# Patient Record
Sex: Female | Born: 1950 | Race: White | Hispanic: No | Smoking: Never smoker
Health system: Southern US, Community
[De-identification: ages and names within clinical notes are randomized; demographics above are authoritative.]

## PROBLEM LIST (undated history)

## (undated) DIAGNOSIS — K219 Gastro-esophageal reflux disease without esophagitis: Secondary | ICD-10-CM

## (undated) DIAGNOSIS — F329 Major depressive disorder, single episode, unspecified: Secondary | ICD-10-CM

## (undated) DIAGNOSIS — F0390 Unspecified dementia without behavioral disturbance: Secondary | ICD-10-CM

## (undated) DIAGNOSIS — I10 Essential (primary) hypertension: Secondary | ICD-10-CM

## (undated) DIAGNOSIS — F419 Anxiety disorder, unspecified: Secondary | ICD-10-CM

## (undated) DIAGNOSIS — I359 Nonrheumatic aortic valve disorder, unspecified: Secondary | ICD-10-CM

## (undated) DIAGNOSIS — E049 Nontoxic goiter, unspecified: Secondary | ICD-10-CM

## (undated) DIAGNOSIS — R7301 Impaired fasting glucose: Secondary | ICD-10-CM

## (undated) DIAGNOSIS — I350 Nonrheumatic aortic (valve) stenosis: Secondary | ICD-10-CM

## (undated) DIAGNOSIS — G2581 Restless legs syndrome: Secondary | ICD-10-CM

## (undated) DIAGNOSIS — M51369 Other intervertebral disc degeneration, lumbar region without mention of lumbar back pain or lower extremity pain: Secondary | ICD-10-CM

## (undated) DIAGNOSIS — M4126 Other idiopathic scoliosis, lumbar region: Secondary | ICD-10-CM

## (undated) DIAGNOSIS — E2839 Other primary ovarian failure: Secondary | ICD-10-CM

## (undated) DIAGNOSIS — F32A Depression, unspecified: Secondary | ICD-10-CM

## (undated) DIAGNOSIS — M5136 Other intervertebral disc degeneration, lumbar region: Secondary | ICD-10-CM

## (undated) DIAGNOSIS — G4700 Insomnia, unspecified: Secondary | ICD-10-CM

## (undated) DIAGNOSIS — G629 Polyneuropathy, unspecified: Secondary | ICD-10-CM

## (undated) DIAGNOSIS — IMO0001 Reserved for inherently not codable concepts without codable children: Secondary | ICD-10-CM

## (undated) DIAGNOSIS — G4730 Sleep apnea, unspecified: Secondary | ICD-10-CM

## (undated) DIAGNOSIS — R112 Nausea with vomiting, unspecified: Secondary | ICD-10-CM

## (undated) DIAGNOSIS — N183 Chronic kidney disease, stage 3 unspecified: Secondary | ICD-10-CM

## (undated) DIAGNOSIS — E785 Hyperlipidemia, unspecified: Secondary | ICD-10-CM

## (undated) DIAGNOSIS — M1990 Unspecified osteoarthritis, unspecified site: Secondary | ICD-10-CM

## (undated) DIAGNOSIS — IMO0002 Reserved for concepts with insufficient information to code with codable children: Secondary | ICD-10-CM

## (undated) DIAGNOSIS — E039 Hypothyroidism, unspecified: Secondary | ICD-10-CM

## (undated) DIAGNOSIS — R55 Syncope and collapse: Secondary | ICD-10-CM

## (undated) DIAGNOSIS — R7303 Prediabetes: Secondary | ICD-10-CM

## (undated) DIAGNOSIS — J45909 Unspecified asthma, uncomplicated: Secondary | ICD-10-CM

## (undated) DIAGNOSIS — Z9889 Other specified postprocedural states: Secondary | ICD-10-CM

## (undated) HISTORY — DX: Major depressive disorder, single episode, unspecified: F32.9

## (undated) HISTORY — PX: COLONOSCOPY: SHX174

## (undated) HISTORY — DX: Essential (primary) hypertension: I10

## (undated) HISTORY — DX: Other idiopathic scoliosis, lumbar region: M41.26

## (undated) HISTORY — DX: Impaired fasting glucose: R73.01

## (undated) HISTORY — PX: JOINT REPLACEMENT: SHX530

## (undated) HISTORY — DX: Hyperlipidemia, unspecified: E78.5

## (undated) HISTORY — DX: Chronic kidney disease, stage 3 unspecified: N18.30

## (undated) HISTORY — DX: Unspecified asthma, uncomplicated: J45.909

## (undated) HISTORY — DX: Nonrheumatic aortic valve disorder, unspecified: I35.9

## (undated) HISTORY — PX: ESOPHAGOGASTRODUODENOSCOPY: SHX1529

## (undated) HISTORY — DX: Other intervertebral disc degeneration, lumbar region: M51.36

## (undated) HISTORY — DX: Other primary ovarian failure: E28.39

## (undated) HISTORY — DX: Syncope and collapse: R55

## (undated) HISTORY — DX: Nontoxic goiter, unspecified: E04.9

## (undated) HISTORY — DX: Restless legs syndrome: G25.81

## (undated) HISTORY — PX: REDUCTION MAMMAPLASTY: SUR839

## (undated) HISTORY — DX: Unspecified osteoarthritis, unspecified site: M19.90

## (undated) HISTORY — DX: Polyneuropathy, unspecified: G62.9

## (undated) HISTORY — DX: Other intervertebral disc degeneration, lumbar region without mention of lumbar back pain or lower extremity pain: M51.369

## (undated) HISTORY — PX: EYE SURGERY: SHX253

## (undated) HISTORY — PX: ABDOMINAL HYSTERECTOMY: SHX81

## (undated) HISTORY — DX: Insomnia, unspecified: G47.00

## (undated) HISTORY — DX: Hypothyroidism, unspecified: E03.9

---

## 1999-03-28 ENCOUNTER — Ambulatory Visit (HOSPITAL_COMMUNITY): Admission: RE | Admit: 1999-03-28 | Discharge: 1999-03-28 | Payer: Self-pay | Admitting: Obstetrics and Gynecology

## 1999-03-28 ENCOUNTER — Encounter: Payer: Self-pay | Admitting: Obstetrics and Gynecology

## 2000-01-16 ENCOUNTER — Ambulatory Visit (HOSPITAL_COMMUNITY): Admission: RE | Admit: 2000-01-16 | Discharge: 2000-01-16 | Payer: Self-pay | Admitting: Plastic Surgery

## 2000-05-06 ENCOUNTER — Ambulatory Visit (HOSPITAL_COMMUNITY): Admission: RE | Admit: 2000-05-06 | Discharge: 2000-05-06 | Payer: Self-pay | Admitting: Obstetrics and Gynecology

## 2000-05-06 ENCOUNTER — Encounter: Payer: Self-pay | Admitting: Obstetrics and Gynecology

## 2001-05-08 ENCOUNTER — Ambulatory Visit (HOSPITAL_COMMUNITY): Admission: RE | Admit: 2001-05-08 | Discharge: 2001-05-08 | Payer: Self-pay | Admitting: Obstetrics and Gynecology

## 2001-05-08 ENCOUNTER — Encounter: Payer: Self-pay | Admitting: Obstetrics and Gynecology

## 2001-06-02 ENCOUNTER — Ambulatory Visit (HOSPITAL_COMMUNITY): Admission: RE | Admit: 2001-06-02 | Discharge: 2001-06-02 | Payer: Self-pay | Admitting: Gastroenterology

## 2002-05-10 ENCOUNTER — Ambulatory Visit (HOSPITAL_COMMUNITY): Admission: RE | Admit: 2002-05-10 | Discharge: 2002-05-10 | Payer: Self-pay | Admitting: Obstetrics and Gynecology

## 2002-05-10 ENCOUNTER — Encounter: Payer: Self-pay | Admitting: Obstetrics and Gynecology

## 2002-09-22 ENCOUNTER — Encounter: Payer: Self-pay | Admitting: General Surgery

## 2002-09-22 ENCOUNTER — Ambulatory Visit (HOSPITAL_COMMUNITY): Admission: RE | Admit: 2002-09-22 | Discharge: 2002-09-22 | Payer: Self-pay | Admitting: General Surgery

## 2003-05-13 ENCOUNTER — Encounter: Payer: Self-pay | Admitting: Obstetrics and Gynecology

## 2003-05-13 ENCOUNTER — Ambulatory Visit (HOSPITAL_COMMUNITY): Admission: RE | Admit: 2003-05-13 | Discharge: 2003-05-13 | Payer: Self-pay | Admitting: Obstetrics and Gynecology

## 2003-05-15 ENCOUNTER — Emergency Department (HOSPITAL_COMMUNITY): Admission: EM | Admit: 2003-05-15 | Discharge: 2003-05-15 | Payer: Self-pay | Admitting: Emergency Medicine

## 2003-05-15 ENCOUNTER — Encounter: Payer: Self-pay | Admitting: Emergency Medicine

## 2003-09-27 ENCOUNTER — Encounter: Admission: RE | Admit: 2003-09-27 | Discharge: 2003-09-27 | Payer: Self-pay | Admitting: Family Medicine

## 2004-05-16 ENCOUNTER — Ambulatory Visit (HOSPITAL_COMMUNITY): Admission: RE | Admit: 2004-05-16 | Discharge: 2004-05-16 | Payer: Self-pay | Admitting: Obstetrics and Gynecology

## 2004-07-31 ENCOUNTER — Encounter: Admission: RE | Admit: 2004-07-31 | Discharge: 2004-07-31 | Payer: Self-pay | Admitting: Occupational Medicine

## 2005-06-10 ENCOUNTER — Ambulatory Visit (HOSPITAL_COMMUNITY): Admission: RE | Admit: 2005-06-10 | Discharge: 2005-06-10 | Payer: Self-pay | Admitting: Obstetrics and Gynecology

## 2006-07-14 ENCOUNTER — Ambulatory Visit (HOSPITAL_COMMUNITY): Admission: RE | Admit: 2006-07-14 | Discharge: 2006-07-14 | Payer: Self-pay | Admitting: Obstetrics and Gynecology

## 2006-09-29 ENCOUNTER — Ambulatory Visit (HOSPITAL_COMMUNITY): Admission: RE | Admit: 2006-09-29 | Discharge: 2006-09-29 | Payer: Self-pay | Admitting: Gastroenterology

## 2007-07-29 ENCOUNTER — Ambulatory Visit (HOSPITAL_COMMUNITY): Admission: RE | Admit: 2007-07-29 | Discharge: 2007-07-29 | Payer: Self-pay | Admitting: Obstetrics and Gynecology

## 2008-03-22 ENCOUNTER — Ambulatory Visit (HOSPITAL_COMMUNITY): Admission: RE | Admit: 2008-03-22 | Discharge: 2008-03-22 | Payer: Self-pay | Admitting: Orthopedic Surgery

## 2008-08-10 ENCOUNTER — Ambulatory Visit (HOSPITAL_COMMUNITY): Admission: RE | Admit: 2008-08-10 | Discharge: 2008-08-10 | Payer: Self-pay | Admitting: Family Medicine

## 2008-12-01 ENCOUNTER — Ambulatory Visit (HOSPITAL_COMMUNITY): Admission: RE | Admit: 2008-12-01 | Discharge: 2008-12-01 | Payer: Self-pay | Admitting: Family Medicine

## 2009-08-11 ENCOUNTER — Ambulatory Visit (HOSPITAL_COMMUNITY): Admission: RE | Admit: 2009-08-11 | Discharge: 2009-08-11 | Payer: Self-pay | Admitting: Family Medicine

## 2009-11-18 HISTORY — PX: BREAST REDUCTION SURGERY: SHX8

## 2010-08-13 ENCOUNTER — Ambulatory Visit (HOSPITAL_COMMUNITY): Admission: RE | Admit: 2010-08-13 | Discharge: 2010-08-13 | Payer: Self-pay | Admitting: Family Medicine

## 2010-11-08 ENCOUNTER — Ambulatory Visit
Admission: RE | Admit: 2010-11-08 | Discharge: 2010-11-08 | Payer: Self-pay | Source: Home / Self Care | Attending: Plastic Surgery | Admitting: Plastic Surgery

## 2010-11-16 ENCOUNTER — Ambulatory Visit (HOSPITAL_COMMUNITY): Admission: RE | Admit: 2010-11-16 | Payer: Self-pay | Source: Home / Self Care | Admitting: Plastic Surgery

## 2010-11-16 ENCOUNTER — Ambulatory Visit
Admission: RE | Admit: 2010-11-16 | Discharge: 2010-11-17 | Payer: Self-pay | Source: Home / Self Care | Attending: Plastic Surgery | Admitting: Plastic Surgery

## 2010-12-09 ENCOUNTER — Encounter: Payer: Self-pay | Admitting: Obstetrics and Gynecology

## 2011-01-28 LAB — POCT I-STAT 4, (NA,K, GLUC, HGB,HCT): Glucose, Bld: 102 mg/dL — ABNORMAL HIGH (ref 70–99)

## 2011-01-28 LAB — POTASSIUM: Potassium: 2.8 mEq/L — ABNORMAL LOW (ref 3.5–5.1)

## 2011-04-05 NOTE — Op Note (Signed)
[name], [name] NO.:  [id number]   MEDICAL RECORD NO.:  [id number]          PATIENT TYPE:  AMB   LOCATION:  ENDO                         FACILITY:  [facility]   PHYSICIAN:  Petra Kuba, M.D.    DATE OF BIRTH:  [birth year]   DATE OF PROCEDURE:  09/29/2006  DATE OF DISCHARGE:                                 OPERATIVE REPORT   SURGEON:  Petra Kuba, M.D.   PROCEDURE:  Colonoscopy.   INDICATIONS:  Personal history of colon polyps, family history of colon  polyps, here for repeat screening.   CONSENT:  Consent was signed after the risks, benefits, methods and options  were thoroughly discussed multiple times in the past.   MEDICINES USED:  Fentanyl 75, Versed 7.5.   DESCRIPTION OF PROCEDURE:  Rectal inspection was pertinent for external  hemorrhoids, small.  Digital exam was negative.  The video pediatric  adjustable colonoscope was inserted and easily advanced through the colon to  the cecum.  No position changes or abdominal pressure were needed.  No  abnormality was seen on insertion.  The cecum was identified by the  appendiceal orifice and the ileocecal valve.  In fact, the scope was  inserted a short way into the terminal ileum, which was normal.  Photodocumentation was obtained.  The scope was slowly withdrawn.  The prep  was adequate.  There was minimal liquid stool.  Therefore, with washing and  suctioning on slow withdrawal through the colon, no abnormalities were seen,  specifically, no polyps, tumors, masses or diverticula.  Once back in the  rectum, anorectal pull-through and retroflexion confirmed some small  hemorrhoids.  The scope was straightened and readvanced a short way up the  left side of the colon.  Air was suctioned, the scope was removed.  The  patient tolerated the procedure well.  There was no obvious immediate  complication.   ENDOSCOPIC DIAGNOSES:  1. Internal and external hemorrhoids.  2. Otherwise within normal limits to the  terminal ileum.   PLAN:  Return for care with Dr. Arvilla Market.  Happy to see back p.r.n.  Recheck  colon in 5 years.           ______________________________  Petra Kuba, M.D.     MEM/MEDQ  D:  09/29/2006  T:  09/29/2006  Job:  [id number]   cc:   Donia Guiles, M.D.

## 2011-08-28 ENCOUNTER — Other Ambulatory Visit (HOSPITAL_COMMUNITY): Payer: Self-pay | Admitting: Family Medicine

## 2011-08-28 DIAGNOSIS — Z1231 Encounter for screening mammogram for malignant neoplasm of breast: Secondary | ICD-10-CM

## 2011-09-11 ENCOUNTER — Ambulatory Visit (HOSPITAL_COMMUNITY)
Admission: RE | Admit: 2011-09-11 | Discharge: 2011-09-11 | Disposition: A | Discharge status: Home / Self Care | Payer: 59 | Source: Ambulatory Visit | Attending: Family Medicine | Admitting: Family Medicine

## 2011-09-11 DIAGNOSIS — Z1231 Encounter for screening mammogram for malignant neoplasm of breast: Secondary | ICD-10-CM | POA: Insufficient documentation

## 2012-05-26 ENCOUNTER — Other Ambulatory Visit: Payer: Self-pay | Admitting: Gastroenterology

## 2012-08-05 ENCOUNTER — Other Ambulatory Visit (HOSPITAL_COMMUNITY): Payer: Self-pay | Admitting: Family Medicine

## 2012-08-05 DIAGNOSIS — Z1231 Encounter for screening mammogram for malignant neoplasm of breast: Secondary | ICD-10-CM

## 2012-09-14 ENCOUNTER — Ambulatory Visit (HOSPITAL_COMMUNITY)
Admission: RE | Admit: 2012-09-14 | Discharge: 2012-09-14 | Disposition: A | Discharge status: Home / Self Care | Payer: 59 | Source: Ambulatory Visit | Attending: Family Medicine | Admitting: Family Medicine

## 2012-09-14 DIAGNOSIS — Z1231 Encounter for screening mammogram for malignant neoplasm of breast: Secondary | ICD-10-CM | POA: Insufficient documentation

## 2013-08-30 ENCOUNTER — Other Ambulatory Visit (HOSPITAL_COMMUNITY): Payer: Self-pay | Admitting: Family Medicine

## 2013-08-30 DIAGNOSIS — Z1231 Encounter for screening mammogram for malignant neoplasm of breast: Secondary | ICD-10-CM

## 2013-09-16 ENCOUNTER — Ambulatory Visit (HOSPITAL_COMMUNITY)
Admission: RE | Admit: 2013-09-16 | Discharge: 2013-09-16 | Disposition: A | Discharge status: Home / Self Care | Payer: BC Managed Care – PPO | Source: Ambulatory Visit | Attending: Family Medicine | Admitting: Family Medicine

## 2013-09-16 DIAGNOSIS — Z1231 Encounter for screening mammogram for malignant neoplasm of breast: Secondary | ICD-10-CM

## 2014-08-15 ENCOUNTER — Other Ambulatory Visit (HOSPITAL_COMMUNITY): Payer: Self-pay | Admitting: Family Medicine

## 2014-08-15 DIAGNOSIS — Z1231 Encounter for screening mammogram for malignant neoplasm of breast: Secondary | ICD-10-CM

## 2014-09-19 ENCOUNTER — Ambulatory Visit (HOSPITAL_COMMUNITY)
Admission: RE | Admit: 2014-09-19 | Discharge: 2014-09-19 | Disposition: A | Discharge status: Home / Self Care | Payer: 59 | Source: Ambulatory Visit | Attending: Family Medicine | Admitting: Family Medicine

## 2014-09-19 DIAGNOSIS — Z803 Family history of malignant neoplasm of breast: Secondary | ICD-10-CM | POA: Diagnosis not present

## 2014-09-19 DIAGNOSIS — Z1231 Encounter for screening mammogram for malignant neoplasm of breast: Secondary | ICD-10-CM

## 2015-10-18 ENCOUNTER — Other Ambulatory Visit: Payer: Self-pay

## 2015-10-18 DIAGNOSIS — Z1231 Encounter for screening mammogram for malignant neoplasm of breast: Secondary | ICD-10-CM

## 2015-11-21 ENCOUNTER — Ambulatory Visit
Admission: RE | Admit: 2015-11-21 | Discharge: 2015-11-21 | Disposition: A | Discharge status: Home / Self Care | Payer: Medicare Other | Source: Ambulatory Visit

## 2015-11-21 DIAGNOSIS — Z1231 Encounter for screening mammogram for malignant neoplasm of breast: Secondary | ICD-10-CM

## 2016-01-01 ENCOUNTER — Ambulatory Visit
Admission: RE | Admit: 2016-01-01 | Discharge: 2016-01-01 | Disposition: A | Discharge status: Home / Self Care | Payer: Medicare Other | Source: Ambulatory Visit | Attending: Family Medicine | Admitting: Family Medicine

## 2016-01-01 ENCOUNTER — Other Ambulatory Visit: Payer: Self-pay | Admitting: Family Medicine

## 2016-01-01 DIAGNOSIS — R0609 Other forms of dyspnea: Secondary | ICD-10-CM

## 2016-01-01 DIAGNOSIS — R0600 Dyspnea, unspecified: Secondary | ICD-10-CM

## 2016-01-02 ENCOUNTER — Other Ambulatory Visit (HOSPITAL_COMMUNITY): Payer: Self-pay | Admitting: Family Medicine

## 2016-01-02 DIAGNOSIS — R0600 Dyspnea, unspecified: Secondary | ICD-10-CM

## 2016-01-02 DIAGNOSIS — R0609 Other forms of dyspnea: Secondary | ICD-10-CM

## 2016-01-12 ENCOUNTER — Ambulatory Visit (HOSPITAL_COMMUNITY): Payer: Medicare Other | Attending: Cardiovascular Disease

## 2016-01-12 ENCOUNTER — Other Ambulatory Visit: Payer: Self-pay

## 2016-01-12 DIAGNOSIS — R0609 Other forms of dyspnea: Secondary | ICD-10-CM | POA: Insufficient documentation

## 2016-01-12 DIAGNOSIS — I313 Pericardial effusion (noninflammatory): Secondary | ICD-10-CM | POA: Diagnosis not present

## 2016-01-12 DIAGNOSIS — E785 Hyperlipidemia, unspecified: Secondary | ICD-10-CM | POA: Diagnosis not present

## 2016-01-12 DIAGNOSIS — I351 Nonrheumatic aortic (valve) insufficiency: Secondary | ICD-10-CM | POA: Diagnosis not present

## 2016-01-12 DIAGNOSIS — I119 Hypertensive heart disease without heart failure: Secondary | ICD-10-CM | POA: Insufficient documentation

## 2016-01-12 DIAGNOSIS — R0600 Dyspnea, unspecified: Secondary | ICD-10-CM

## 2016-01-23 ENCOUNTER — Telehealth: Payer: Self-pay | Admitting: Cardiology

## 2016-01-23 NOTE — Telephone Encounter (Signed)
Received records from [facility] for appointment on 02/02/16 with Dr Percival Spanish.  Records given to [facility] (medical records) for Dr Hochrein's schedule on 02/02/16. lp

## 2016-01-25 ENCOUNTER — Telehealth: Payer: Self-pay | Admitting: Cardiovascular Disease

## 2016-01-25 NOTE — Telephone Encounter (Signed)
Received records from [facility] for appointment on 01/26/16 with Dr Sallyanne Kuster.  Records given to [facility] (medical records) for Dr Croitoru's schedule on 01/26/16. lp

## 2016-01-26 ENCOUNTER — Encounter: Payer: Self-pay | Admitting: Cardiovascular Disease

## 2016-01-26 ENCOUNTER — Ambulatory Visit (INDEPENDENT_AMBULATORY_CARE_PROVIDER_SITE_OTHER): Payer: Medicare Other | Admitting: Cardiovascular Disease

## 2016-01-26 VITALS — BP 160/72 | HR 72 | Ht 66.5 in | Wt 203.9 lb

## 2016-01-26 DIAGNOSIS — R079 Chest pain, unspecified: Secondary | ICD-10-CM | POA: Diagnosis not present

## 2016-01-26 DIAGNOSIS — R0600 Dyspnea, unspecified: Secondary | ICD-10-CM

## 2016-01-26 DIAGNOSIS — R55 Syncope and collapse: Secondary | ICD-10-CM

## 2016-01-26 DIAGNOSIS — I3139 Other pericardial effusion (noninflammatory): Secondary | ICD-10-CM

## 2016-01-26 DIAGNOSIS — G4710 Hypersomnia, unspecified: Secondary | ICD-10-CM | POA: Diagnosis not present

## 2016-01-26 DIAGNOSIS — I10 Essential (primary) hypertension: Secondary | ICD-10-CM

## 2016-01-26 DIAGNOSIS — E669 Obesity, unspecified: Secondary | ICD-10-CM

## 2016-01-26 DIAGNOSIS — I319 Disease of pericardium, unspecified: Secondary | ICD-10-CM

## 2016-01-26 DIAGNOSIS — E038 Other specified hypothyroidism: Secondary | ICD-10-CM

## 2016-01-26 DIAGNOSIS — E034 Atrophy of thyroid (acquired): Secondary | ICD-10-CM

## 2016-01-26 DIAGNOSIS — I313 Pericardial effusion (noninflammatory): Secondary | ICD-10-CM

## 2016-01-26 DIAGNOSIS — J45909 Unspecified asthma, uncomplicated: Secondary | ICD-10-CM

## 2016-01-26 DIAGNOSIS — R0609 Other forms of dyspnea: Secondary | ICD-10-CM | POA: Diagnosis not present

## 2016-01-26 DIAGNOSIS — R011 Cardiac murmur, unspecified: Secondary | ICD-10-CM

## 2016-01-26 DIAGNOSIS — E785 Hyperlipidemia, unspecified: Secondary | ICD-10-CM

## 2016-01-26 MED ORDER — FUROSEMIDE 20 MG PO TABS: 10.0000 mg | ORAL_TABLET | Freq: Every day | ORAL | Status: DC

## 2016-01-26 MED ORDER — FUROSEMIDE 20 MG PO TABS: 20.0000 mg | ORAL_TABLET | Freq: Every day | ORAL | Status: DC

## 2016-01-26 NOTE — Patient Instructions (Addendum)
.  Your physician has recommended you make the following change in your medication:   START FUROSEMIDE 20 MG DAILY  Your physician has requested that you have en exercise stress myoview. For further information please visit [email or web address]. Please follow instruction sheet, as given.  Your physician has recommended that you have a sleep study. This test records several body functions during sleep, including: brain activity, eye movement, oxygen and carbon dioxide blood levels, heart rate and rhythm, breathing rate and rhythm, the flow of air through your mouth and nose, snoring, body muscle movements, and chest and belly movement.  Dr. Sallyanne Kuster recommends that you schedule a follow-up appointment in: [place]
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
## 2016-01-27 ENCOUNTER — Encounter: Payer: Self-pay | Admitting: Cardiovascular Disease

## 2016-01-27 DIAGNOSIS — I3139 Other pericardial effusion (noninflammatory): Secondary | ICD-10-CM | POA: Insufficient documentation

## 2016-01-27 DIAGNOSIS — R0600 Dyspnea, unspecified: Secondary | ICD-10-CM | POA: Insufficient documentation

## 2016-01-27 DIAGNOSIS — I313 Pericardial effusion (noninflammatory): Secondary | ICD-10-CM | POA: Insufficient documentation

## 2016-01-27 DIAGNOSIS — E039 Hypothyroidism, unspecified: Secondary | ICD-10-CM

## 2016-01-27 DIAGNOSIS — R0609 Other forms of dyspnea: Secondary | ICD-10-CM | POA: Insufficient documentation

## 2016-01-27 DIAGNOSIS — J45909 Unspecified asthma, uncomplicated: Secondary | ICD-10-CM

## 2016-01-27 DIAGNOSIS — I10 Essential (primary) hypertension: Secondary | ICD-10-CM | POA: Insufficient documentation

## 2016-01-27 DIAGNOSIS — R55 Syncope and collapse: Secondary | ICD-10-CM

## 2016-01-27 DIAGNOSIS — R011 Cardiac murmur, unspecified: Secondary | ICD-10-CM

## 2016-01-27 DIAGNOSIS — E669 Obesity, unspecified: Secondary | ICD-10-CM | POA: Insufficient documentation

## 2016-01-27 DIAGNOSIS — E782 Mixed hyperlipidemia: Secondary | ICD-10-CM | POA: Insufficient documentation

## 2016-01-27 DIAGNOSIS — E785 Hyperlipidemia, unspecified: Secondary | ICD-10-CM | POA: Insufficient documentation

## 2016-01-27 DIAGNOSIS — G4710 Hypersomnia, unspecified: Secondary | ICD-10-CM

## 2016-01-27 DIAGNOSIS — R079 Chest pain, unspecified: Secondary | ICD-10-CM | POA: Insufficient documentation

## 2016-01-27 HISTORY — DX: Cardiac murmur, unspecified: R01.1

## 2016-01-27 HISTORY — DX: Mixed hyperlipidemia: E78.2

## 2016-01-27 HISTORY — DX: Other forms of dyspnea: R06.09

## 2016-01-27 HISTORY — DX: Essential (primary) hypertension: I10

## 2016-01-27 HISTORY — DX: Hyperlipidemia, unspecified: E78.5

## 2016-01-27 HISTORY — DX: Hypothyroidism, unspecified: E03.9

## 2016-01-27 HISTORY — DX: Dyspnea, unspecified: R06.00

## 2016-01-27 HISTORY — DX: Syncope and collapse: R55

## 2016-01-27 HISTORY — DX: Pericardial effusion (noninflammatory): I31.3

## 2016-01-27 HISTORY — DX: Chest pain, unspecified: R07.9

## 2016-01-27 HISTORY — DX: Other pericardial effusion (noninflammatory): I31.39

## 2016-01-27 HISTORY — DX: Unspecified asthma, uncomplicated: J45.909

## 2016-01-27 HISTORY — DX: Hypersomnia, unspecified: G47.10

## 2016-02-02 ENCOUNTER — Ambulatory Visit: Payer: Medicare Other | Admitting: Cardiology

## 2016-02-07 ENCOUNTER — Telehealth (HOSPITAL_COMMUNITY): Payer: Self-pay

## 2016-02-07 NOTE — Telephone Encounter (Signed)
Encounter complete. 

## 2016-02-09 ENCOUNTER — Ambulatory Visit (HOSPITAL_COMMUNITY)
Admission: RE | Admit: 2016-02-09 | Discharge: 2016-02-09 | Disposition: A | Discharge status: Home / Self Care | Payer: Medicare Other | Source: Ambulatory Visit | Attending: Cardiology | Admitting: Cardiology

## 2016-02-09 DIAGNOSIS — Z6832 Body mass index (BMI) 32.0-32.9, adult: Secondary | ICD-10-CM | POA: Insufficient documentation

## 2016-02-09 DIAGNOSIS — E663 Overweight: Secondary | ICD-10-CM | POA: Insufficient documentation

## 2016-02-09 DIAGNOSIS — Z8249 Family history of ischemic heart disease and other diseases of the circulatory system: Secondary | ICD-10-CM | POA: Insufficient documentation

## 2016-02-09 DIAGNOSIS — R0609 Other forms of dyspnea: Secondary | ICD-10-CM | POA: Insufficient documentation

## 2016-02-09 DIAGNOSIS — I10 Essential (primary) hypertension: Secondary | ICD-10-CM | POA: Diagnosis not present

## 2016-02-09 DIAGNOSIS — R5383 Other fatigue: Secondary | ICD-10-CM | POA: Diagnosis not present

## 2016-02-09 DIAGNOSIS — J45909 Unspecified asthma, uncomplicated: Secondary | ICD-10-CM | POA: Insufficient documentation

## 2016-02-09 DIAGNOSIS — R0600 Dyspnea, unspecified: Secondary | ICD-10-CM

## 2016-02-09 LAB — MYOCARDIAL PERFUSION IMAGING
CHL CUP MPHR: 154 {beats}/min
CHL CUP NUCLEAR SDS: 1
CHL CUP RESTING HR STRESS: 55 {beats}/min
CHL RATE OF PERCEIVED EXERTION: 17
CSEPED: 8 min
CSEPEDS: 1 s
CSEPEW: 9.2 METS
LV sys vol: 28 mL
LVDIAVOL: 79 mL (ref 46–106)
Peak HR: 181 {beats}/min
Percent HR: 117 %
SRS: 1
SSS: 2
TID: 1.1

## 2016-02-09 MED ORDER — TECHNETIUM TC 99M SESTAMIBI GENERIC - CARDIOLITE
31.6000 | Freq: Once | INTRAVENOUS | Status: AC | PRN
  Administered 2016-02-09: 31.6 via INTRAVENOUS

## 2016-02-09 MED ORDER — TECHNETIUM TC 99M SESTAMIBI GENERIC - CARDIOLITE
10.4000 | Freq: Once | INTRAVENOUS | Status: AC | PRN
  Administered 2016-02-09: 10.4 via INTRAVENOUS

## 2016-02-16 ENCOUNTER — Encounter: Payer: Self-pay | Admitting: Cardiovascular Disease

## 2016-04-01 ENCOUNTER — Ambulatory Visit (HOSPITAL_BASED_OUTPATIENT_CLINIC_OR_DEPARTMENT_OTHER): Payer: Medicare Other | Attending: Cardiovascular Disease | Admitting: Cardiovascular Disease

## 2016-04-01 VITALS — Ht 66.0 in | Wt 203.0 lb

## 2016-04-01 DIAGNOSIS — G4719 Other hypersomnia: Secondary | ICD-10-CM | POA: Insufficient documentation

## 2016-04-01 DIAGNOSIS — I493 Ventricular premature depolarization: Secondary | ICD-10-CM | POA: Insufficient documentation

## 2016-04-01 DIAGNOSIS — G4761 Periodic limb movement disorder: Secondary | ICD-10-CM | POA: Diagnosis not present

## 2016-04-01 DIAGNOSIS — R5383 Other fatigue: Secondary | ICD-10-CM | POA: Insufficient documentation

## 2016-04-01 DIAGNOSIS — Z79899 Other long term (current) drug therapy: Secondary | ICD-10-CM | POA: Insufficient documentation

## 2016-04-01 DIAGNOSIS — E669 Obesity, unspecified: Secondary | ICD-10-CM | POA: Diagnosis not present

## 2016-04-01 DIAGNOSIS — R51 Headache: Secondary | ICD-10-CM | POA: Diagnosis not present

## 2016-04-01 DIAGNOSIS — G4710 Hypersomnia, unspecified: Secondary | ICD-10-CM | POA: Diagnosis present

## 2016-04-01 DIAGNOSIS — Z7982 Long term (current) use of aspirin: Secondary | ICD-10-CM | POA: Diagnosis not present

## 2016-04-01 DIAGNOSIS — G4733 Obstructive sleep apnea (adult) (pediatric): Secondary | ICD-10-CM | POA: Insufficient documentation

## 2016-04-01 DIAGNOSIS — R0683 Snoring: Secondary | ICD-10-CM | POA: Insufficient documentation

## 2016-04-01 DIAGNOSIS — Z6833 Body mass index (BMI) 33.0-33.9, adult: Secondary | ICD-10-CM | POA: Insufficient documentation

## 2016-04-01 DIAGNOSIS — I10 Essential (primary) hypertension: Secondary | ICD-10-CM | POA: Diagnosis not present

## 2016-04-07 ENCOUNTER — Encounter (HOSPITAL_BASED_OUTPATIENT_CLINIC_OR_DEPARTMENT_OTHER): Payer: Self-pay | Admitting: Cardiovascular Disease

## 2016-04-07 NOTE — Procedures (Signed)
Patient Name: Charlotte [name], Charlotte [name] Date: 04/01/2016 Gender: Female D.O.B: 1951/11/08 Age (years): 64 Referring Provider: Dani Gobble Croitoru Height (inches): 66 Interpreting Physician: [name] MD, ABSM Weight (lbs): 203 RPSGT: Carolin Coy BMI: 33 MRN: [id number] Neck Size: 14.00  CLINICAL INFORMATION Sleep Study Type: Split Night CPAP Indication for sleep study: Excessive Daytime Sleepiness, Fatigue, Hypertension, Morning Headaches, Obesity, Snoring Epworth Sleepiness Score: 9  SLEEP STUDY TECHNIQUE As per the AASM Manual for the Scoring of Sleep and Associated Events v2.3 (April 2016) with a hypopnea requiring 4% desaturations. The channels recorded and monitored were frontal, central and occipital EEG, electrooculogram (EOG), submentalis EMG (chin), nasal and oral airflow, thoracic and abdominal wall motion, anterior tibialis EMG, snore microphone, electrocardiogram, and pulse oximetry. Continuous positive airway pressure (CPAP) was initiated when the patient met split night criteria and was titrated according to treat sleep-disordered breathing.  MEDICATIONS  albuterol (PROVENTIL HFA;VENTOLIN HFA) 108 (90 Base) MCG/ACT inhaler 1 puff, Every 6 hours PRN     aspirin 81 MG tablet 81 mg, Daily     Calcium Carb-Cholecalciferol (CALCIUM-VITAMIN D) 500-400 MG-UNIT TABS 1 tablet, 2 times daily     Cholecalciferol (VITAMIN D PO) 1 capsule, Daily     folic acid (FOLVITE) 366 MCG tablet 400 mcg, Daily     furosemide (LASIX) 20 MG tablet 10 mg, Daily     Glycopyrrolate-Formoterol (BEVESPI AEROSPHERE) 9-4.8 MCG/ACT AERO 2 Dose, 2 times daily     levothyroxine (SYNTHROID, LEVOTHROID) 50 MCG tablet 1 tablet, Daily     Note: Received from: External Pharmacy Received Sig: (Written 01/26/2016 1424)   lisinopril-hydrochlorothiazide (PRINZIDE,ZESTORETIC) 10-12.5 MG tablet 1 tablet, Daily     Note: Received from: External Pharmacy Received Sig: (Written 01/26/2016 1424)   sertraline  (ZOLOFT) 100 MG tablet 1 tablet, Daily     Note: Received from: External Pharmacy Received Sig: (Written 01/26/2016 1424)   simvastatin (ZOCOR) 40 MG tablet 1 tablet, Daily     Note: Received from: External Pharmacy Received Sig: (Written 01/26/2016 1424)   topiramate (TOPAMAX) 50 MG tablet 0.5 tablet, Daily     Note: Received from: External Pharmacy Received Sig: (Written     Medications administered by patient during sleep study : No sleep medicine administered.  RESPIRATORY PARAMETERS Diagnostic Total AHI (/hr): 32.7 RDI (/hr): 67.8 OA Index (/hr): 0.4 CA Index (/hr): 0.0 REM AHI (/hr): N/A NREM AHI (/hr): 32.7 Supine AHI (/hr): 42.4 Non-supine AHI (/hr): 30.40 Min O2 Sat (%): 86.00 Mean O2 (%): 93.84 Time below 88% (min): 0.7   Titration Optimal Pressure (cm): 11 AHI at Optimal Pressure (/hr): 0.0 Min O2 at Optimal Pressure (%): 92.0 Supine % at Optimal (%): 47 Sleep % at Optimal (%): 97    SLEEP ARCHITECTURE The recording time for the entire night was 383.4 minutes. During a baseline period of 180.3 minutes, the patient slept for 143.3 minutes in REM and nonREM, yielding a sleep efficiency of 79.5%. Sleep onset after lights out was 10.4 minutes with a REM latency of N/A minutes. The patient spent 11.16% of the night in stage N1 sleep, 88.49% in stage N2 sleep, 0.35% in stage N3 and 0.00% in REM. During the titration period of 202.3 minutes, the patient slept for 185.8 minutes in REM and nonREM, yielding a sleep efficiency of 91.8%. Sleep onset after CPAP initiation was 0.0 minutes with a REM latency of 32.8 minutes. The patient spent 3.93% of the night in stage N1 sleep, 39.56% in stage N2 sleep, 0.00% in stage  N3 and 56.51% in REM.  CARDIAC DATA The 2 lead EKG demonstrated sinus rhythm. The mean heart rate was 64.07 beats per minute. Other EKG findings include: None.  LEG MOVEMENT DATA The total Periodic Limb Movements of Sleep (PLMS) were 136. The PLMS index was 24.76  .  IMPRESSIONS - Severe obstructive sleep apnea occurred during the diagnostic portion of the study (AHI = 32.7/hour). There is a positional component with supine sleep AHI at 42.4/hour. There was absence of REM  sleep on the baseline portion of the study.  CPAP was initiated at 5 cm water pressure and was titrated up to 11 cm. An optimal PAP pressure was selected for this patient ( 11 cm of water) - No significant central sleep apnea occurred during the diagnostic portion of the study (CAI = 0.0/hour). - Mild oxygen desaturation was noted during the diagnostic portion of the study  to a nadir of 86.00%. - Abnormal sleep architecture on the diagnostic portion of the study with absence of slow-wave and REM sleep.  REM rebound was seen with CPAP titration - The patient snored with Soft snoring volume during the diagnostic portion of the study. - The arousal index was abnormal. - The patient was in sinus rhythm.  A rare PVC was demonstrated. - Moderate periodic limb movements of sleep with a PLMS index of 24.76  DIAGNOSIS - Obstructive Sleep Apnea (327.23 [G47.33 ICD-10])  RECOMMENDATIONS - Recommend an initial trial of CPAP therapy EPR of 3 at 11 cm H2O with a Small size Resmed Full Face Mask AirFit F20 mask and heated humidification.   - Efforts to be made to optimize nasal and oral pharyngeal patency. - Avoid alcohol, sedatives and other CNS depressants that may worsen sleep apnea and disrupt normal sleep architecture. - The patient should be counseled to avoid supine sleep. - If patient is symptomatic with restless legs on CPAP therapy, consider a trial of reck with her another agent with the PLMS index of 24.76. - Sleep hygiene should be reviewed to assess factors that may improve sleep quality. - Weight management and regular exercise should be initiated or continued. - Recommend a download be obtained in 4 weeks and sleep clinic evaluation.   Troy Sine, MD, [place],  American Board of Sleep Medicine  ELECTRONICALLY SIGNED ON:  04/07/2016, 7:29 PM [place] PH: [phone]   FX: [phone] [place]

## 2016-04-18 ENCOUNTER — Telehealth: Payer: Self-pay | Admitting: *Deleted

## 2016-04-18 NOTE — Telephone Encounter (Signed)
Called and informed patient of sleep study results and recommendations. Patient voiced verbal understanding. She had no additional questions for me.

## 2016-04-18 NOTE — Telephone Encounter (Signed)
CPAP order and referral sent to [facility] for set up.

## 2016-04-22 ENCOUNTER — Ambulatory Visit (INDEPENDENT_AMBULATORY_CARE_PROVIDER_SITE_OTHER): Payer: Medicare Other | Admitting: Cardiovascular Disease

## 2016-04-22 ENCOUNTER — Encounter: Payer: Self-pay | Admitting: Cardiovascular Disease

## 2016-04-22 VITALS — BP 150/90 | HR 71 | Ht 67.0 in | Wt 203.0 lb

## 2016-04-22 DIAGNOSIS — R079 Chest pain, unspecified: Secondary | ICD-10-CM

## 2016-04-22 DIAGNOSIS — G4733 Obstructive sleep apnea (adult) (pediatric): Secondary | ICD-10-CM

## 2016-04-22 DIAGNOSIS — R55 Syncope and collapse: Secondary | ICD-10-CM | POA: Diagnosis not present

## 2016-04-22 DIAGNOSIS — I10 Essential (primary) hypertension: Secondary | ICD-10-CM

## 2016-04-22 DIAGNOSIS — I5032 Chronic diastolic (congestive) heart failure: Secondary | ICD-10-CM | POA: Diagnosis not present

## 2016-04-22 DIAGNOSIS — E785 Hyperlipidemia, unspecified: Secondary | ICD-10-CM

## 2016-04-22 HISTORY — DX: Chronic diastolic (congestive) heart failure: I50.32

## 2016-04-22 HISTORY — DX: Obstructive sleep apnea (adult) (pediatric): G47.33

## 2016-04-22 MED ORDER — LISINOPRIL-HYDROCHLOROTHIAZIDE 20-12.5 MG PO TABS: 1.0000 | ORAL_TABLET | Freq: Every day | ORAL | Status: DC

## 2016-04-22 NOTE — Patient Instructions (Signed)
Dr Sallyanne Kuster has recommended making the following medication changes: 1. INCREASE Lisinopril-HCT to 20-12.5 daily  Dr Sallyanne Kuster recommends that you schedule a follow-up appointment with Dr Claiborne Billings on a sleep clinic day.
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
## 2016-06-13 ENCOUNTER — Telehealth: Payer: Self-pay | Admitting: Cardiovascular Disease

## 2016-06-13 NOTE — Telephone Encounter (Signed)
Returned call to daughter regarding surgical clearance.  Unable to locate surgical clearance paperwork at this time, Primary CMA OOO.  Advised to call Dr. Floy Sabina office and have them fax to [phone] again.  Daughter verbalized understanding.

## 2016-06-13 NOTE — Telephone Encounter (Signed)
New Message  Pt dtr calling to get update on surgical clearance from Dr Juluis Rainier office for shoulder surgery- stated that clearance was faxed a week ago. Please call back and discuss.

## 2016-06-17 NOTE — Telephone Encounter (Signed)
Request for surgical clearance:   1. What type surgery is being performed? Right reverse total shoulder arthroplasty  2. When is this surgery scheduled? pending  3. Are there any medications that need to be held prior to surgery and how long? N/A; patient is taking ASA 81 mg QD  4. Name of the physician performing surgery: Dr Esmond Plants  5. What is the office phone and fax number?  Phone [phone]  Fax [phone]

## 2016-06-18 ENCOUNTER — Encounter: Payer: Self-pay | Admitting: Cardiovascular Disease

## 2016-06-18 NOTE — Telephone Encounter (Signed)
Sent via epic 

## 2016-06-19 NOTE — Telephone Encounter (Signed)
Clearance letter faxed 

## 2016-06-26 ENCOUNTER — Ambulatory Visit (INDEPENDENT_AMBULATORY_CARE_PROVIDER_SITE_OTHER): Payer: Medicare Other | Admitting: Cardiovascular Disease

## 2016-06-26 ENCOUNTER — Encounter: Payer: Self-pay | Admitting: Cardiovascular Disease

## 2016-06-26 VITALS — BP 104/66 | HR 77 | Ht 67.0 in

## 2016-06-26 DIAGNOSIS — G4710 Hypersomnia, unspecified: Secondary | ICD-10-CM | POA: Diagnosis not present

## 2016-06-26 DIAGNOSIS — R0609 Other forms of dyspnea: Secondary | ICD-10-CM

## 2016-06-26 DIAGNOSIS — I10 Essential (primary) hypertension: Secondary | ICD-10-CM

## 2016-06-26 DIAGNOSIS — R0600 Dyspnea, unspecified: Secondary | ICD-10-CM

## 2016-06-26 DIAGNOSIS — G4733 Obstructive sleep apnea (adult) (pediatric): Secondary | ICD-10-CM | POA: Diagnosis not present

## 2016-06-26 NOTE — Patient Instructions (Signed)
Your physician recommends that you schedule a follow-up appointment in: October -November sleep clinic

## 2016-07-03 ENCOUNTER — Encounter: Payer: Self-pay | Admitting: Cardiovascular Disease

## 2016-07-03 NOTE — Progress Notes (Signed)
PCP: Dr. [name] Primary cardiologist: Dr. Sallyanne Kuster   HPI: Charlotte [name] is a 65 y.o. female was recently found to have obstructive sleep apnea.  She presents for sleep clinic evaluation following CPAP initiation.  Charlotte [name] has a history of asthma, hypertension, hyperlipidemia, hypothyroidism, and is followed by Dr. Sallyanne Kuster for cardiology care.  She was referred for a sleep study due to concerns of obstructive sleep apnea.  She underwent a split-night study on 04/01/2016.  This demonstrated severe obstructive sleep apnea with an AHI of 32.7 per hour.  She had a significant positional component with supine sleep AHI at 42.4 per hour.  She was unable to achieve any REM sleep on the baseline portion of the study.  CPAP titration was initiated titrated up to 11 cm water pressure.  During her study .  She had soft snoring.  There was a significantly abnormal arousal index area.  She was in sinus rhythm and had a rare P EC.  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  She was set up with CPAP on 05/03/2016.  She has [employer] Medicare advantage for insurance.  A download was obtained from 05/20/2016 through August 1 17.  She had only 27% of days of usage and only 17% of days greater than 4 hours of use.  She was only averaging 4 hours and 24 minutes.  On 11 cm water pressure, her AHI was 4.0, with an apnea index was 3.8 with a hypopnea index of 0.2.  Since she underwent shoulder surgery  she has been sleeping in a recliner since her surgery.  She has had pain in her shoulder which has been limiting her sleep.  An Epworth sleepiness scale score was calculated today as shown below and this endorsed at 16 compatible with significant excessive daytime sleepiness.  Epworth Sleepiness Scale: Situation   Chance of Dozing/Sleeping (0 = never , 1 = slight chance , 2 = moderate chance , 3 = high chance )   sitting and reading 2   watching TV 2   sitting  inactive in a public place 2   being a passenger in a motor vehicle for an hour or more 2   lying down in the afternoon 2   sitting and talking to someone 2   sitting quietly after lunch (no alcohol) 2   while stopped for a few minutes in traffic as the driver 2   Total Score  16    Past Medical History:  Diagnosis Date  . Asthma 01/27/2016  . Essential hypertension 01/27/2016  . Hyperlipidemia 01/27/2016  . Hypothyroidism 01/27/2016  . Syncope 01/27/2016    Past Surgical History:  Procedure Laterality Date  . BREAST REDUCTION SURGERY  2011    No Known Allergies  Current Outpatient Prescriptions  Medication Sig Dispense Refill  . albuterol (PROVENTIL HFA;VENTOLIN HFA) 108 (90 Base) MCG/ACT inhaler Inhale 1 puff into the lungs every 6 (six) hours as needed for wheezing or shortness of breath.    Marland Kitchen aspirin 81 MG tablet Take 81 mg by mouth daily.    . Calcium Carb-Cholecalciferol (CALCIUM-VITAMIN D) 500-400 MG-UNIT TABS Take 1 tablet by mouth 2 (two) times daily.    . Cholecalciferol (VITAMIN D PO) Take 1 capsule by mouth daily.    . furosemide (LASIX) 20 MG tablet Take 0.5 tablets (10 mg total) by mouth daily. 90 tablet 3  . Glycopyrrolate-Formoterol (BEVESPI AEROSPHERE) 9-4.8 MCG/ACT AERO Inhale 2  Doses into the lungs 2 (two) times daily.    Marland Kitchen levothyroxine (SYNTHROID, LEVOTHROID) 50 MCG tablet Take 1 tablet by mouth daily.    Marland Kitchen lisinopril-hydrochlorothiazide (PRINZIDE,ZESTORETIC) 20-12.5 MG tablet Take 1 tablet by mouth daily. 90 tablet 3  . sertraline (ZOLOFT) 100 MG tablet Take 1 tablet by mouth daily.    . simvastatin (ZOCOR) 40 MG tablet Take 1 tablet by mouth daily.    . traMADol (ULTRAM) 50 MG tablet Take 1-2 tablets by mouth every 6 (six) hours as needed (pain).   0   No current facility-administered medications for this visit.     Social History   Social History  . Marital status: Widowed    Spouse name: N/A  . Number of children: N/A  . Years of education: N/A    Occupational History  . Not on file.   Social History Main Topics  . Smoking status: Never Smoker  . Smokeless tobacco: Not on file  . Alcohol use Not on file  . Drug use: Unknown  . Sexual activity: Not on file   Other Topics Concern  . Not on file   Social History Narrative   Epworth Sleepiness Scale = 17 (as of 02/16/2016)    Family History  Problem Relation Age of Onset  . Heart failure Mother   . Diabetes Mother   . Hypertension Mother   . Hyperlipidemia Mother   . Lung cancer Father   . Diabetes Father   . Hypertension Father   . Hypertension Sister   . Hyperlipidemia Sister   . Hyperthyroidism Sister   . Lung cancer Brother   . Rheum arthritis Child   . Lung cancer Child   . Heart disease Maternal Grandfather   . Lung cancer Paternal Grandfather   . Diabetes Brother   . Hyperlipidemia Brother   . Hyperlipidemia Brother      ROS General: Negative; No fevers, chills, or night sweats HEENT: Negative; No changes in vision or hearing, sinus congestion, difficulty swallowing Pulmonary: Positive for asthma Cardiovascular: Negative; No chest pain, presyncope, syncope, palpatations GI: Negative; No nausea, vomiting, diarrhea, or abdominal pain GU: Negative; No dysuria, hematuria, or difficulty voiding Musculoskeletal: Negative; no myalgias, joint pain, or weakness Hematologic: Negative; no easy bruising, bleeding Endocrine: Positive for hypothyroidism. Neuro: Negative; no changes in balance, headaches Skin: Negative; No rashes or skin lesions Psychiatric: Negative; No behavioral problems, depression Sleep: Positive for OSA.  Recently started on CPAP with poor compliance.  Positive for daytime sleepiness, and hypersomnolence; no bruxism, restless legs, hypnogognic hallucinations, no cataplexy   Physical Exam BP 104/66   Pulse 77   Ht 5\' 7"  (1.702 m)   SpO2 98%   Wt Readings from Last 3 Encounters:  04/22/16 203 lb (92.1 kg)  04/01/16 203 lb (92.1 kg)   02/09/16 203 lb (92.1 kg)   General: Alert, oriented, no distress.  Skin: normal turgor, no rashes HEENT: Normocephalic, atraumatic. Pupils round and reactive; sclera anicteric; extraocular muscles intact; Fundi Without hemorrhages or exudates. Nose without nasal septal hypertrophy Mouth/Parynx benign; Mallinpatti scale 3 Neck: No JVD, no carotid briuts Lungs: clear to ausculatation and percussion; no wheezing or rales  Chest wall: No tenderness to palpation Heart: RRR, s1 s2 normal; 2/ 6 early peaking systolic murmur Abdomen: soft, nontender; no hepatosplenomehaly, BS+; abdominal aorta nontender and not dilated by palpation. Back: No CVA tenderness Pulses 2+ Extremities: no clubbinbg cyanosis or edema, Homan's sign negative  Neurologic: grossly nonfocal; cranial nerves intact. Psychological: Normal affect and  mood.  ECG (independently read by me): Not done today, but the ECG from 04/22/2016 was reviewed which showed sinus rhythm with inferolateral ST changes  LABS:  BMP Latest Ref Rng & Units 11/08/2010 11/08/2010  Glucose 70 - 99 mg/dL - 102(H)  Sodium 135 - 145 mEq/L - 144  Potassium 3.5 - 5.1 mEq/L 2.8(L) 2.8(L)     No flowsheet data found.   CBC Latest Ref Rng & Units 11/08/2010  Hemoglobin 12.0 - 15.0 g/dL 12.9  Hematocrit 36.0 - 46.0 % 38.0     Lipid Panel  No results found for: CHOL, TRIG, HDL, CHOLHDL, VLDL, LDLCALC, LDLDIRECT   RADIOLOGY: No results found.    ASSESSMENT AND PLAN: Charlotte [name] is a 65 year old female who has a history of hypertension, asthma, hypothyroidism, hyperlipidemia, and was recently found to have severe obstructive sleep apnea on her sleep study from May 2017.  On the diagnostic portion of the study, her AHI was 32.7 per hour but her RDI was significantly increased at 67.8 per hour.  She was unable to achieve any rim sleep.  She was titrated up to 11 cm water pressure with excellent benefit.  She did have moderate periodic limb  movement disorder of sleep with an index of 24.8.  I reviewed her download, which was done from July 3 through 06/18/2016 and she is not meeting compliance requirements.  I had a long discussion with her today in the office.  I reviewed normal versus abnormal sleep architecture.  I reviewed her sleep study in detail.  We discussed the adverse consequences of untreated sleep apnea particularly with reference to cardiovascular morbidity.  We discussed what is necessary for her to meet compliance standards.  Since [place] healthcare Medicare advantage is her insurance company she needs to be compliant by November 17, 2016.  Based on her most recent download, I am increasing her CPAP to 12 cm water pressure.  We discussed  trying to sleep in bed rather than in her recliner, and recommended potential avoidance of supine sleep due to her positional component.  I will need to see her in the office once she is meeting compliance standards prior to her appliance December deadline.   Troy Sine, MD, [name]  07/03/2016 4:46 PM

## 2016-07-23 NOTE — H&P (Signed)
  Charlotte [name] is an 65 y.o. female.    Chief Complaint: right shoulder pain  HPI: Pt is a 65 y.o. female complaining of right shoulder pain for multiple years. Pain had continually increased since the beginning. X-rays in the clinic show end-stage arthritic changes of the right shoulder. Pt has tried various conservative treatments which have failed to alleviate their symptoms, including injections and therapy. Various options are discussed with the patient. Risks, benefits and expectations were discussed with the patient. Patient understand the risks, benefits and expectations and wishes to proceed with surgery.   PCP:  [name], MD  D/C Plans: Home  PMH: Past Medical History:  Diagnosis Date  . Asthma 01/27/2016  . Essential hypertension 01/27/2016  . Hyperlipidemia 01/27/2016  . Hypothyroidism 01/27/2016  . Syncope 01/27/2016    PSH: Past Surgical History:  Procedure Laterality Date  . BREAST REDUCTION SURGERY  2011    Social History:  reports that she has never smoked. She does not have any smokeless tobacco history on file. Her alcohol and drug histories are not on file.  Allergies:  No Known Allergies  Medications: No current facility-administered medications for this encounter.    Current Outpatient Prescriptions  Medication Sig Dispense Refill  . albuterol (PROVENTIL HFA;VENTOLIN HFA) 108 (90 Base) MCG/ACT inhaler Inhale 1 puff into the lungs every 6 (six) hours as needed for wheezing or shortness of breath.    Marland Kitchen aspirin 81 MG tablet Take 81 mg by mouth daily.    . Calcium Carb-Cholecalciferol (CALCIUM-VITAMIN D) 500-400 MG-UNIT TABS Take 1 tablet by mouth 2 (two) times daily.    . furosemide (LASIX) 20 MG tablet Take 0.5 tablets (10 mg total) by mouth daily. (Patient taking differently: Take 20 mg by mouth daily. ) 90 tablet 3  . Glycopyrrolate-Formoterol (BEVESPI AEROSPHERE) 9-4.8 MCG/ACT AERO Inhale 2 Doses into the lungs 2 (two) times daily.    Marland Kitchen levothyroxine  (SYNTHROID, LEVOTHROID) 50 MCG tablet Take 1 tablet by mouth daily.    Marland Kitchen lisinopril-hydrochlorothiazide (PRINZIDE,ZESTORETIC) 20-12.5 MG tablet Take 1 tablet by mouth daily. 90 tablet 3  . sertraline (ZOLOFT) 100 MG tablet Take 1 tablet by mouth daily.    . simvastatin (ZOCOR) 40 MG tablet Take 1 tablet by mouth daily.    . traMADol (ULTRAM) 50 MG tablet Take 1-2 tablets by mouth every 6 (six) hours as needed (pain).   0    No results found for this or any previous visit (from the past 48 hour(s)). No results found.  ROS: Pain with rom of the right upper extremity  Physical Exam:  Alert and oriented 65 y.o. female in no acute distress Cranial nerves 2-12 intact Cervical spine: full rom with no tenderness, nv intact distally Chest: active breath sounds bilaterally, no wheeze rhonchi or rales Heart: regular rate and rhythm, no murmur Abd: non tender non distended with active bowel sounds Hip is stable with rom  Right shoulder with limited rom and moderate weakness nv intact distally No rashes or edema  Assessment/Plan Assessment: right shoulder rotator cuff arthropathy  Plan: Patient will undergo a right reverse total shoulder by Dr. Veverly Fells at [facility]. Risks benefits and expectations were discussed with the patient. Patient understand risks, benefits and expectations and wishes to proceed.

## 2016-07-24 NOTE — Pre-Procedure Instructions (Signed)
Meliah Drolet  07/24/2016      [place], [place] [facility] [place] [address] [place] [address] Phone: [phone] Fax: [phone]  [id number] - [place], [place] - [address] [place] [address] Phone: [phone] Fax: [phone]    Your procedure is scheduled on September 15  Report to [place] at 1100 A.M.  Call this number if you have problems the morning of surgery:  [phone]   Remember:  Do not eat food or drink liquids after midnight.   Take these medicines the morning of surgery with A SIP OF WATER albuterol (bring with you), glycopyrrolate-Formoterol, levothyroxine (synthroid),   7 days prior to surgery STOP taking any Aspirin, Aleve, Naproxen, Ibuprofen, Motrin, Advil, Goody's, BC's, all herbal medications, fish oil, and all vitamins    Do not wear jewelry, make-up or nail polish.  Do not wear lotions, powders, or perfumes, or deoderant.  Do not shave 48 hours prior to surgery.  Men may shave face and neck.  Do not bring valuables to the hospital.  [facility] is not responsible for any belongings or valuables.  Contacts, dentures or bridgework may not be worn into surgery.  Leave your suitcase in the car.  After surgery it may be brought to your room.  For patients admitted to the hospital, discharge time will be determined by your treatment team.  Patients discharged the day of surgery will not be allowed to drive home.    Special instructions:   [place]- Preparing For Surgery  Before surgery, you can play an important role. Because skin is not sterile, your skin needs to be as free of germs as possible. You can reduce the number of germs on your skin by washing with CHG (chlorahexidine gluconate) Soap before surgery.  CHG is an antiseptic cleaner which kills germs and bonds with the skin to continue  killing germs even after washing.  Please do not use if you have an allergy to CHG or antibacterial soaps. If your skin becomes reddened/irritated stop using the CHG.  Do not shave (including legs and underarms) for at least 48 hours prior to first CHG shower. It is OK to shave your face.  Please follow these instructions carefully.   1. Shower the NIGHT BEFORE SURGERY and the MORNING OF SURGERY with CHG.   2. If you chose to wash your hair, wash your hair first as usual with your normal shampoo.  3. After you shampoo, rinse your hair and body thoroughly to remove the shampoo.  4. Use CHG as you would any other liquid soap. You can apply CHG directly to the skin and wash gently with a scrungie or a clean washcloth.   5. Apply the CHG Soap to your body ONLY FROM THE NECK DOWN.  Do not use on open wounds or open sores. Avoid contact with your eyes, ears, mouth and genitals (private parts). Wash genitals (private parts) with your normal soap.  6. Wash thoroughly, paying special attention to the area where your surgery will be performed.  7. Thoroughly rinse your body with warm water from the neck down.  8. DO NOT shower/wash with your normal soap after using and rinsing off the CHG Soap.  9. Pat yourself dry with a CLEAN TOWEL.   10. Wear CLEAN PAJAMAS   11. Place CLEAN SHEETS on your bed  the night of your first shower and DO NOT SLEEP WITH PETS.    Day of Surgery: Do not apply any deodorants/lotions. Please wear clean clothes to the hospital/surgery center.      Please read over the following fact sheets that you were given. Coughing and Deep Breathing, MRSA Information and Surgical Site Infection Prevention

## 2016-07-25 ENCOUNTER — Encounter (HOSPITAL_COMMUNITY)
Admission: RE | Admit: 2016-07-25 | Discharge: 2016-07-25 | Disposition: A | Discharge status: Home / Self Care | Payer: Medicare Other | Source: Ambulatory Visit | Attending: Orthopedic Surgery | Admitting: Orthopedic Surgery

## 2016-07-25 ENCOUNTER — Encounter (HOSPITAL_COMMUNITY): Payer: Self-pay

## 2016-07-25 DIAGNOSIS — J45909 Unspecified asthma, uncomplicated: Secondary | ICD-10-CM | POA: Diagnosis not present

## 2016-07-25 DIAGNOSIS — Z7982 Long term (current) use of aspirin: Secondary | ICD-10-CM | POA: Diagnosis not present

## 2016-07-25 DIAGNOSIS — G4733 Obstructive sleep apnea (adult) (pediatric): Secondary | ICD-10-CM | POA: Insufficient documentation

## 2016-07-25 DIAGNOSIS — E785 Hyperlipidemia, unspecified: Secondary | ICD-10-CM | POA: Insufficient documentation

## 2016-07-25 DIAGNOSIS — I10 Essential (primary) hypertension: Secondary | ICD-10-CM | POA: Insufficient documentation

## 2016-07-25 DIAGNOSIS — K219 Gastro-esophageal reflux disease without esophagitis: Secondary | ICD-10-CM | POA: Diagnosis not present

## 2016-07-25 DIAGNOSIS — Z79899 Other long term (current) drug therapy: Secondary | ICD-10-CM | POA: Insufficient documentation

## 2016-07-25 DIAGNOSIS — Z01812 Encounter for preprocedural laboratory examination: Secondary | ICD-10-CM | POA: Diagnosis not present

## 2016-07-25 DIAGNOSIS — E039 Hypothyroidism, unspecified: Secondary | ICD-10-CM | POA: Diagnosis not present

## 2016-07-25 HISTORY — DX: Major depressive disorder, single episode, unspecified: F32.9

## 2016-07-25 HISTORY — DX: Anxiety disorder, unspecified: F41.9

## 2016-07-25 HISTORY — DX: Reserved for inherently not codable concepts without codable children: IMO0001

## 2016-07-25 HISTORY — DX: Depression, unspecified: F32.A

## 2016-07-25 HISTORY — DX: Gastro-esophageal reflux disease without esophagitis: K21.9

## 2016-07-25 HISTORY — DX: Unspecified osteoarthritis, unspecified site: M19.90

## 2016-07-25 HISTORY — DX: Sleep apnea, unspecified: G47.30

## 2016-07-25 LAB — CBC
HEMATOCRIT: 36.4 % (ref 36.0–46.0)
Hemoglobin: 11.7 g/dL — ABNORMAL LOW (ref 12.0–15.0)
MCH: 29.1 pg (ref 26.0–34.0)
MCHC: 32.1 g/dL (ref 30.0–36.0)
MCV: 90.5 fL (ref 78.0–100.0)
PLATELETS: 182 10*3/uL (ref 150–400)
RBC: 4.02 MIL/uL (ref 3.87–5.11)
RDW: 13.8 % (ref 11.5–15.5)
WBC: 5.7 10*3/uL (ref 4.0–10.5)

## 2016-07-25 LAB — BASIC METABOLIC PANEL
Anion gap: 8 (ref 5–15)
BUN: 9 mg/dL (ref 6–20)
CHLORIDE: 108 mmol/L (ref 101–111)
CO2: 25 mmol/L (ref 22–32)
Calcium: 9.2 mg/dL (ref 8.9–10.3)
Creatinine, Ser: 0.86 mg/dL (ref 0.44–1.00)
Glucose, Bld: 105 mg/dL — ABNORMAL HIGH (ref 65–99)
POTASSIUM: 4.1 mmol/L (ref 3.5–5.1)
SODIUM: 141 mmol/L (ref 135–145)

## 2016-07-25 LAB — SURGICAL PCR SCREEN
MRSA, PCR: NEGATIVE
Staphylococcus aureus: NEGATIVE

## 2016-07-25 NOTE — Progress Notes (Addendum)
PCP - [name], MD Cardiologist - [place]  Chest x-ray - not needed EKG - 01/26/16 Stress Test - 02/09/16 ECHO - 01/12/16 Cardiac Cath - denies   Sleep study 04/01/16 - wears mask instructed patient to bring make with her the day of surgery  Aspirin to be stopped 5-7 days before surgery    Patient denies shortness of breath, fever, cough and chest pain at PAT appointment

## 2016-07-30 NOTE — Progress Notes (Signed)
Anesthesia Chart Review:  Pt is a 65 year old female scheduled for R reverse shoulder arthroplasty on 08/02/2016 with Netta Cedars, MD.   - Cardiologist is Sanda Klein, MD who has cleared pt at low risk for surgery  PMH includes:  HTN, hyperlipidemia, OSA, asthma, hypothyroidism, GERD. Never smoker. BMI 31.  Medications include: albuterol, ASA, lasix, bevespi aerosphere, levothyroxine, lisinopril-hctz, simvastatin.   Preoperative labs reviewed.    CXR 01/01/16: Borderline cardiomegaly.  No active disease.  EKG 01/29/16: NSR with PACs  Nuclear stress test 02/09/16:   Nuclear stress EF: 65%. No wall motion abnormalities  The left ventricular ejection fraction is normal (55-65%).  This is a low risk study. There is no evidence of ischemia on exam  Myocardial thickness appears increased, consider left ventricular hypertrophy.  Occasional PVCs seen during stress, brief paroxysmal atrial tachycardia, 5 beat run noted as well.  Echo 01/12/16:  - Left ventricle: The cavity size was normal. There was mild concentric hypertrophy. Systolic function was normal. The estimated ejection fraction was in the range of 55% to 60%. Wall motion was normal; there were no regional wall motion abnormalities. Doppler parameters are consistent with abnormal left ventricular relaxation (grade 1 diastolic dysfunction). - Aortic valve: There was mild regurgitation. - Left atrium: The atrium was mildly dilated. - Right ventricle: The cavity size was normal. Wall thickness was normal. Systolic function was normal. - Tricuspid valve: There was no regurgitation. - Inferior vena cava: The vessel was normal in size. The respirophasic diameter changes were in the normal range (>= 50%), consistent with normal central venous pressure. - Pericardium, extracardiac: A small pericardial effusion was identified. Features were not consistent with tamponade physiology.  If no changes, I anticipate pt can proceed with surgery  as scheduled.   Willeen Cass, FNP-BC [facility]/Anesthesiology Phone: [phone] 07/30/2016 2:56 PM

## 2016-08-01 MED ORDER — CEFAZOLIN SODIUM-DEXTROSE 2-4 GM/100ML-% IV SOLN
2.0000 g | INTRAVENOUS | Status: AC
  Administered 2016-08-02: 2 g via INTRAVENOUS
  Filled 2016-08-01: qty 100

## 2016-08-02 ENCOUNTER — Inpatient Hospital Stay (HOSPITAL_COMMUNITY): Payer: Medicare Other | Admitting: Anesthesiology

## 2016-08-02 ENCOUNTER — Inpatient Hospital Stay (HOSPITAL_COMMUNITY): Payer: Medicare Other

## 2016-08-02 ENCOUNTER — Inpatient Hospital Stay (HOSPITAL_COMMUNITY): Payer: Medicare Other | Admitting: Emergency Medicine

## 2016-08-02 ENCOUNTER — Encounter (HOSPITAL_COMMUNITY)
Admission: RE | Disposition: A | Discharge status: Home / Self Care | Payer: Self-pay | Source: Ambulatory Visit | Attending: Orthopedic Surgery

## 2016-08-02 ENCOUNTER — Encounter (HOSPITAL_COMMUNITY): Payer: Self-pay | Admitting: *Deleted

## 2016-08-02 ENCOUNTER — Inpatient Hospital Stay (HOSPITAL_COMMUNITY)
Admission: RE | Admit: 2016-08-02 | Discharge: 2016-08-03 | DRG: 483 | Disposition: A | Discharge status: Home / Self Care | Payer: Medicare Other | Source: Ambulatory Visit | Attending: Orthopedic Surgery | Admitting: Orthopedic Surgery

## 2016-08-02 DIAGNOSIS — Z7982 Long term (current) use of aspirin: Secondary | ICD-10-CM | POA: Diagnosis not present

## 2016-08-02 DIAGNOSIS — J45909 Unspecified asthma, uncomplicated: Secondary | ICD-10-CM | POA: Diagnosis not present

## 2016-08-02 DIAGNOSIS — M19011 Primary osteoarthritis, right shoulder: Secondary | ICD-10-CM | POA: Diagnosis not present

## 2016-08-02 DIAGNOSIS — I10 Essential (primary) hypertension: Secondary | ICD-10-CM | POA: Diagnosis present

## 2016-08-02 DIAGNOSIS — E039 Hypothyroidism, unspecified: Secondary | ICD-10-CM | POA: Diagnosis not present

## 2016-08-02 DIAGNOSIS — M25511 Pain in right shoulder: Secondary | ICD-10-CM | POA: Diagnosis present

## 2016-08-02 DIAGNOSIS — Z96619 Presence of unspecified artificial shoulder joint: Secondary | ICD-10-CM

## 2016-08-02 DIAGNOSIS — Z79899 Other long term (current) drug therapy: Secondary | ICD-10-CM | POA: Diagnosis not present

## 2016-08-02 DIAGNOSIS — E785 Hyperlipidemia, unspecified: Secondary | ICD-10-CM | POA: Diagnosis not present

## 2016-08-02 DIAGNOSIS — Z96611 Presence of right artificial shoulder joint: Secondary | ICD-10-CM

## 2016-08-02 HISTORY — PX: REVERSE SHOULDER ARTHROPLASTY: SHX5054

## 2016-08-02 HISTORY — DX: Presence of unspecified artificial shoulder joint: Z96.619

## 2016-08-02 SURGERY — ARTHROPLASTY, SHOULDER, TOTAL, REVERSE
Anesthesia: Regional | Laterality: Right

## 2016-08-02 MED ORDER — LIDOCAINE 2% (20 MG/ML) 5 ML SYRINGE
INTRAMUSCULAR | Status: AC
  Filled 2016-08-02: qty 5

## 2016-08-02 MED ORDER — SERTRALINE HCL 100 MG PO TABS
100.0000 mg | ORAL_TABLET | Freq: Every day | ORAL | Status: DC
  Administered 2016-08-03: 100 mg via ORAL
  Filled 2016-08-02: qty 1

## 2016-08-02 MED ORDER — BISACODYL 10 MG RE SUPP: 10.0000 mg | Freq: Every day | RECTAL | Status: DC | PRN

## 2016-08-02 MED ORDER — TRAMADOL HCL 50 MG PO TABS: 50.0000 mg | ORAL_TABLET | Freq: Four times a day (QID) | ORAL | Status: DC | PRN

## 2016-08-02 MED ORDER — FENTANYL CITRATE (PF) 100 MCG/2ML IJ SOLN
INTRAMUSCULAR | Status: DC | PRN
  Administered 2016-08-02: 50 ug via INTRAVENOUS
  Administered 2016-08-02: 100 ug via INTRAVENOUS

## 2016-08-02 MED ORDER — MIDAZOLAM HCL 2 MG/2ML IJ SOLN
INTRAMUSCULAR | Status: AC
  Filled 2016-08-02: qty 2

## 2016-08-02 MED ORDER — MENTHOL 3 MG MT LOZG: 1.0000 | LOZENGE | OROMUCOSAL | Status: DC | PRN

## 2016-08-02 MED ORDER — ONDANSETRON HCL 4 MG/2ML IJ SOLN
4.0000 mg | Freq: Four times a day (QID) | INTRAMUSCULAR | Status: DC | PRN
  Administered 2016-08-03 (×2): 4 mg via INTRAVENOUS
  Filled 2016-08-02 (×2): qty 2

## 2016-08-02 MED ORDER — LISINOPRIL-HYDROCHLOROTHIAZIDE 20-12.5 MG PO TABS
1.0000 | ORAL_TABLET | Freq: Every day | ORAL | Status: DC
  Administered 2016-08-02: 1 via ORAL

## 2016-08-02 MED ORDER — SODIUM CHLORIDE 0.9 % IV SOLN
INTRAVENOUS | Status: DC
  Administered 2016-08-02: 18:00:00 via INTRAVENOUS

## 2016-08-02 MED ORDER — METOCLOPRAMIDE HCL 5 MG/ML IJ SOLN: 5.0000 mg | Freq: Three times a day (TID) | INTRAMUSCULAR | Status: DC | PRN

## 2016-08-02 MED ORDER — ASPIRIN EC 81 MG PO TBEC
81.0000 mg | DELAYED_RELEASE_TABLET | Freq: Every day | ORAL | Status: DC
  Administered 2016-08-03: 81 mg via ORAL
  Filled 2016-08-02: qty 1

## 2016-08-02 MED ORDER — FENTANYL CITRATE (PF) 100 MCG/2ML IJ SOLN
INTRAMUSCULAR | Status: AC
  Filled 2016-08-02: qty 2

## 2016-08-02 MED ORDER — METHOCARBAMOL 1000 MG/10ML IJ SOLN
500.0000 mg | Freq: Four times a day (QID) | INTRAVENOUS | Status: DC | PRN
  Filled 2016-08-02: qty 5

## 2016-08-02 MED ORDER — PHENOL 1.4 % MT LIQD: 1.0000 | OROMUCOSAL | Status: DC | PRN

## 2016-08-02 MED ORDER — ONDANSETRON HCL 4 MG/2ML IJ SOLN
INTRAMUSCULAR | Status: DC | PRN
  Administered 2016-08-02: 4 mg via INTRAVENOUS

## 2016-08-02 MED ORDER — HYDROMORPHONE HCL 1 MG/ML IJ SOLN
0.2500 mg | INTRAMUSCULAR | Status: DC | PRN
  Administered 2016-08-02: 0.25 mg via INTRAVENOUS
  Administered 2016-08-02 (×2): 0.5 mg via INTRAVENOUS

## 2016-08-02 MED ORDER — FENTANYL CITRATE (PF) 100 MCG/2ML IJ SOLN
INTRAMUSCULAR | Status: AC
  Administered 2016-08-02: 50 ug
  Filled 2016-08-02: qty 2

## 2016-08-02 MED ORDER — SUGAMMADEX SODIUM 200 MG/2ML IV SOLN
INTRAVENOUS | Status: AC
  Filled 2016-08-02: qty 2

## 2016-08-02 MED ORDER — PROPOFOL 10 MG/ML IV BOLUS
INTRAVENOUS | Status: DC | PRN
  Administered 2016-08-02: 130 mg via INTRAVENOUS

## 2016-08-02 MED ORDER — METOCLOPRAMIDE HCL 5 MG PO TABS
5.0000 mg | ORAL_TABLET | Freq: Three times a day (TID) | ORAL | Status: DC | PRN
Start: 2016-08-02 — End: 2016-08-03

## 2016-08-02 MED ORDER — OXYCODONE-ACETAMINOPHEN 5-325 MG PO TABS: 1.0000 | ORAL_TABLET | ORAL | 0 refills | Status: DC | PRN

## 2016-08-02 MED ORDER — ACETAMINOPHEN 325 MG PO TABS: 650.0000 mg | ORAL_TABLET | Freq: Four times a day (QID) | ORAL | Status: DC | PRN

## 2016-08-02 MED ORDER — METHOCARBAMOL 500 MG PO TABS
500.0000 mg | ORAL_TABLET | Freq: Four times a day (QID) | ORAL | Status: DC | PRN
  Administered 2016-08-02 – 2016-08-03 (×2): 500 mg via ORAL
  Filled 2016-08-02 (×2): qty 1

## 2016-08-02 MED ORDER — ONDANSETRON HCL 4 MG/2ML IJ SOLN
INTRAMUSCULAR | Status: AC
Start: 2016-08-02 — End: 2016-08-02
  Filled 2016-08-02: qty 2

## 2016-08-02 MED ORDER — OXYCODONE HCL 5 MG PO TABS
ORAL_TABLET | ORAL | Status: AC
  Filled 2016-08-02: qty 1

## 2016-08-02 MED ORDER — CEFAZOLIN SODIUM-DEXTROSE 2-4 GM/100ML-% IV SOLN
2.0000 g | Freq: Four times a day (QID) | INTRAVENOUS | Status: AC
  Administered 2016-08-02 – 2016-08-03 (×3): 2 g via INTRAVENOUS
  Filled 2016-08-02 (×3): qty 100

## 2016-08-02 MED ORDER — POLYETHYLENE GLYCOL 3350 17 G PO PACK: 17.0000 g | PACK | Freq: Every day | ORAL | Status: DC | PRN

## 2016-08-02 MED ORDER — PHENYLEPHRINE HCL 10 MG/ML IJ SOLN
INTRAVENOUS | Status: DC | PRN
  Administered 2016-08-02: 25 ug/min via INTRAVENOUS

## 2016-08-02 MED ORDER — HYDROMORPHONE HCL 1 MG/ML IJ SOLN
INTRAMUSCULAR | Status: AC
  Filled 2016-08-02: qty 1

## 2016-08-02 MED ORDER — LIDOCAINE 2% (20 MG/ML) 5 ML SYRINGE
INTRAMUSCULAR | Status: DC | PRN
  Administered 2016-08-02: 40 mg via INTRAVENOUS

## 2016-08-02 MED ORDER — BUPIVACAINE-EPINEPHRINE (PF) 0.25% -1:200000 IJ SOLN
INTRAMUSCULAR | Status: AC
  Filled 2016-08-02: qty 30

## 2016-08-02 MED ORDER — MORPHINE SULFATE (PF) 2 MG/ML IV SOLN
2.0000 mg | INTRAVENOUS | Status: DC | PRN
  Administered 2016-08-02 – 2016-08-03 (×3): 2 mg via INTRAVENOUS
  Filled 2016-08-02 (×3): qty 1

## 2016-08-02 MED ORDER — HYDROMORPHONE HCL 1 MG/ML IJ SOLN
INTRAMUSCULAR | Status: AC
  Administered 2016-08-02: 0.5 mg via INTRAVENOUS
  Filled 2016-08-02: qty 1

## 2016-08-02 MED ORDER — MIDAZOLAM HCL 5 MG/5ML IJ SOLN
INTRAMUSCULAR | Status: DC | PRN
  Administered 2016-08-02: 2 mg via INTRAVENOUS

## 2016-08-02 MED ORDER — HYDROCHLOROTHIAZIDE 12.5 MG PO CAPS
12.5000 mg | ORAL_CAPSULE | Freq: Every day | ORAL | Status: DC
  Administered 2016-08-03: 12.5 mg via ORAL
  Filled 2016-08-02: qty 1

## 2016-08-02 MED ORDER — BUPIVACAINE-EPINEPHRINE 0.25% -1:200000 IJ SOLN
INTRAMUSCULAR | Status: DC | PRN
  Administered 2016-08-02: 9 mL

## 2016-08-02 MED ORDER — ROCURONIUM BROMIDE 10 MG/ML (PF) SYRINGE
PREFILLED_SYRINGE | INTRAVENOUS | Status: AC
  Filled 2016-08-02: qty 10

## 2016-08-02 MED ORDER — SIMVASTATIN 40 MG PO TABS
40.0000 mg | ORAL_TABLET | Freq: Every day | ORAL | Status: DC
  Administered 2016-08-03: 40 mg via ORAL
  Filled 2016-08-02: qty 1

## 2016-08-02 MED ORDER — CHLORHEXIDINE GLUCONATE 4 % EX LIQD: 60.0000 mL | Freq: Once | CUTANEOUS | Status: DC

## 2016-08-02 MED ORDER — FUROSEMIDE 20 MG PO TABS
20.0000 mg | ORAL_TABLET | Freq: Every day | ORAL | Status: DC
  Administered 2016-08-02 – 2016-08-03 (×2): 20 mg via ORAL
  Filled 2016-08-02 (×2): qty 1

## 2016-08-02 MED ORDER — ACETAMINOPHEN 650 MG RE SUPP: 650.0000 mg | Freq: Four times a day (QID) | RECTAL | Status: DC | PRN

## 2016-08-02 MED ORDER — SUGAMMADEX SODIUM 200 MG/2ML IV SOLN
INTRAVENOUS | Status: DC | PRN
  Administered 2016-08-02: 200 mg via INTRAVENOUS

## 2016-08-02 MED ORDER — LACTATED RINGERS IV SOLN
INTRAVENOUS | Status: DC
  Administered 2016-08-02: 50 mL/h via INTRAVENOUS

## 2016-08-02 MED ORDER — SODIUM CHLORIDE 0.9 % IR SOLN
Status: DC | PRN
  Administered 2016-08-02: 1000 mL

## 2016-08-02 MED ORDER — ONDANSETRON HCL 4 MG PO TABS: 4.0000 mg | ORAL_TABLET | Freq: Four times a day (QID) | ORAL | Status: DC | PRN

## 2016-08-02 MED ORDER — LISINOPRIL 20 MG PO TABS
20.0000 mg | ORAL_TABLET | Freq: Every day | ORAL | Status: DC
  Administered 2016-08-02 – 2016-08-03 (×2): 20 mg via ORAL
  Filled 2016-08-02 (×2): qty 1

## 2016-08-02 MED ORDER — METHOCARBAMOL 500 MG PO TABS
ORAL_TABLET | ORAL | Status: AC
  Filled 2016-08-02: qty 1

## 2016-08-02 MED ORDER — OXYCODONE HCL 5 MG PO TABS
5.0000 mg | ORAL_TABLET | ORAL | Status: DC | PRN
  Administered 2016-08-02: 5 mg via ORAL
  Administered 2016-08-02 – 2016-08-03 (×5): 10 mg via ORAL
  Filled 2016-08-02 (×5): qty 2

## 2016-08-02 MED ORDER — EPHEDRINE SULFATE-NACL 50-0.9 MG/10ML-% IV SOSY
PREFILLED_SYRINGE | INTRAVENOUS | Status: DC | PRN
  Administered 2016-08-02: 7.5 mg via INTRAVENOUS

## 2016-08-02 MED ORDER — ALBUTEROL SULFATE (2.5 MG/3ML) 0.083% IN NEBU: 3.0000 mL | INHALATION_SOLUTION | Freq: Four times a day (QID) | RESPIRATORY_TRACT | Status: DC | PRN

## 2016-08-02 MED ORDER — PROPOFOL 10 MG/ML IV BOLUS
INTRAVENOUS | Status: AC
  Filled 2016-08-02: qty 20

## 2016-08-02 MED ORDER — CALCIUM CARBONATE-VITAMIN D 500-200 MG-UNIT PO TABS
1.0000 | ORAL_TABLET | Freq: Two times a day (BID) | ORAL | Status: DC
  Administered 2016-08-02 – 2016-08-03 (×2): 1 via ORAL
  Filled 2016-08-02 (×2): qty 1

## 2016-08-02 MED ORDER — LEVOTHYROXINE SODIUM 50 MCG PO TABS
50.0000 ug | ORAL_TABLET | Freq: Every day | ORAL | Status: DC
  Administered 2016-08-03: 50 ug via ORAL
  Filled 2016-08-02 (×2): qty 1

## 2016-08-02 MED ORDER — EPHEDRINE 5 MG/ML INJ
INTRAVENOUS | Status: AC
  Filled 2016-08-02: qty 10

## 2016-08-02 MED ORDER — DOCUSATE SODIUM 100 MG PO CAPS
100.0000 mg | ORAL_CAPSULE | Freq: Two times a day (BID) | ORAL | Status: DC
  Administered 2016-08-02 – 2016-08-03 (×2): 100 mg via ORAL
  Filled 2016-08-02 (×2): qty 1

## 2016-08-02 MED ORDER — METHOCARBAMOL 500 MG PO TABS: 500.0000 mg | ORAL_TABLET | Freq: Three times a day (TID) | ORAL | 1 refills | Status: DC | PRN

## 2016-08-02 MED ORDER — ROCURONIUM BROMIDE 10 MG/ML (PF) SYRINGE
PREFILLED_SYRINGE | INTRAVENOUS | Status: DC | PRN
  Administered 2016-08-02: 30 mg via INTRAVENOUS

## 2016-08-02 SURGICAL SUPPLY — 66 items
BIT DRILL 5/64X5 DISP (BIT) ×2 IMPLANT
BLADE SAG 18X100X1.27 (BLADE) ×2 IMPLANT
CAPT SHLDR REVTOTAL 1 ×1 IMPLANT
COVER SURGICAL LIGHT HANDLE (MISCELLANEOUS) ×2 IMPLANT
DRAPE IMP U-DRAPE 54X76 (DRAPES) ×2 IMPLANT
DRAPE INCISE IOBAN 66X45 STRL (DRAPES) ×2 IMPLANT
DRAPE ORTHO SPLIT 77X108 STRL (DRAPES) ×4
DRAPE SURG ORHT 6 SPLT 77X108 (DRAPES) ×2 IMPLANT
DRAPE U-SHAPE 47X51 STRL (DRAPES) ×2 IMPLANT
DRAPE X-RAY CASS 24X20 (DRAPES) IMPLANT
DRSG ADAPTIC 3X8 NADH LF (GAUZE/BANDAGES/DRESSINGS) ×2 IMPLANT
DRSG PAD ABDOMINAL 8X10 ST (GAUZE/BANDAGES/DRESSINGS) ×2 IMPLANT
DURAPREP 26ML APPLICATOR (WOUND CARE) ×2 IMPLANT
ELECT BLADE 4.0 EZ CLEAN MEGAD (MISCELLANEOUS) ×2
ELECT NDL TIP 2.8 STRL (NEEDLE) ×1 IMPLANT
ELECT NEEDLE TIP 2.8 STRL (NEEDLE) ×2 IMPLANT
ELECT REM PT RETURN 9FT ADLT (ELECTROSURGICAL) ×2
ELECTRODE BLDE 4.0 EZ CLN MEGD (MISCELLANEOUS) ×1 IMPLANT
ELECTRODE REM PT RTRN 9FT ADLT (ELECTROSURGICAL) ×1 IMPLANT
GAUZE SPONGE 4X4 12PLY STRL (GAUZE/BANDAGES/DRESSINGS) ×2 IMPLANT
GLOVE BIOGEL PI ORTHO PRO 7.5 (GLOVE) ×1
GLOVE BIOGEL PI ORTHO PRO SZ8 (GLOVE) ×1
GLOVE ORTHO TXT STRL SZ7.5 (GLOVE) ×2 IMPLANT
GLOVE PI ORTHO PRO STRL 7.5 (GLOVE) ×1 IMPLANT
GLOVE PI ORTHO PRO STRL SZ8 (GLOVE) ×1 IMPLANT
GLOVE SURG ORTHO 8.5 STRL (GLOVE) ×2 IMPLANT
GOWN STRL REUS W/ TWL LRG LVL3 (GOWN DISPOSABLE) ×1 IMPLANT
GOWN STRL REUS W/ TWL XL LVL3 (GOWN DISPOSABLE) ×2 IMPLANT
GOWN STRL REUS W/TWL LRG LVL3 (GOWN DISPOSABLE) ×2
GOWN STRL REUS W/TWL XL LVL3 (GOWN DISPOSABLE) ×4
HANDPIECE INTERPULSE COAX TIP (DISPOSABLE)
KIT BASIN OR (CUSTOM PROCEDURE TRAY) ×2 IMPLANT
KIT ROOM TURNOVER OR (KITS) ×2 IMPLANT
MANIFOLD NEPTUNE II (INSTRUMENTS) ×2 IMPLANT
NDL 1/2 CIR MAYO (NEEDLE) ×1 IMPLANT
NDL HYPO 25GX1X1/2 BEV (NEEDLE) ×1 IMPLANT
NEEDLE 1/2 CIR MAYO (NEEDLE) ×2 IMPLANT
NEEDLE HYPO 25GX1X1/2 BEV (NEEDLE) ×2 IMPLANT
NS IRRIG 1000ML POUR BTL (IV SOLUTION) ×2 IMPLANT
PACK SHOULDER (CUSTOM PROCEDURE TRAY) ×2 IMPLANT
PAD ARMBOARD 7.5X6 YLW CONV (MISCELLANEOUS) ×4 IMPLANT
PIN GUIDE 1.2 (PIN) ×1 IMPLANT
PIN GUIDE GLENOPHERE 1.5MX300M (PIN) ×1 IMPLANT
PIN METAGLENE 2.5 (PIN) ×1 IMPLANT
SET HNDPC FAN SPRY TIP SCT (DISPOSABLE) IMPLANT
SLING ARM LRG ADULT FOAM STRAP (SOFTGOODS) ×1 IMPLANT
SLING ARM MED ADULT FOAM STRAP (SOFTGOODS) IMPLANT
SPONGE LAP 18X18 X RAY DECT (DISPOSABLE) IMPLANT
SPONGE LAP 4X18 X RAY DECT (DISPOSABLE) ×2 IMPLANT
STRIP CLOSURE SKIN 1/2X4 (GAUZE/BANDAGES/DRESSINGS) ×2 IMPLANT
SUCTION FRAZIER HANDLE 10FR (MISCELLANEOUS) ×1
SUCTION TUBE FRAZIER 10FR DISP (MISCELLANEOUS) ×1 IMPLANT
SUT FIBERWIRE #2 38 T-5 BLUE (SUTURE) ×4
SUT MNCRL AB 4-0 PS2 18 (SUTURE) ×2 IMPLANT
SUT VIC AB 2-0 CT1 27 (SUTURE) ×2
SUT VIC AB 2-0 CT1 TAPERPNT 27 (SUTURE) ×1 IMPLANT
SUT VICRYL 0 CT 1 36IN (SUTURE) ×2 IMPLANT
SUTURE FIBERWR #2 38 T-5 BLUE (SUTURE) ×2 IMPLANT
SYR CONTROL 10ML LL (SYRINGE) ×2 IMPLANT
TOWEL OR 17X24 6PK STRL BLUE (TOWEL DISPOSABLE) ×2 IMPLANT
TOWEL OR 17X26 10 PK STRL BLUE (TOWEL DISPOSABLE) ×2 IMPLANT
TOWER CARTRIDGE SMART MIX (DISPOSABLE) IMPLANT
TRAY FOLEY CATH 16FRSI W/METER (SET/KITS/TRAYS/PACK) IMPLANT
TUBE CONNECTING 12X1/4 (SUCTIONS) ×1 IMPLANT
WATER STERILE IRR 1000ML POUR (IV SOLUTION) ×2 IMPLANT
YANKAUER SUCT BULB TIP NO VENT (SUCTIONS) ×2 IMPLANT

## 2016-08-02 NOTE — Interval H&P Note (Signed)
History and Physical Interval Note:  08/02/2016 12:12 PM  Charlotte [name]  has presented today for surgery, with the diagnosis of RIGHT SHOULDER ROTATOR CUFF ARTHROPATHY  The various methods of treatment have been discussed with the patient and family. After consideration of risks, benefits and other options for treatment, the patient has consented to  Procedure(s): REVERSE SHOULDER ARTHROPLASTY (Right) as a surgical intervention .  The patient's history has been reviewed, patient examined, no change in status, stable for surgery.  I have reviewed the patient's chart and labs.  Questions were answered to the patient's satisfaction.     Octavio Matheney,STEVEN R

## 2016-08-02 NOTE — Progress Notes (Signed)
Patient placed on CPAP auto mode with her own mask and 2l O2 bleed in. Patient doing well and tolerating at this time. Will Call if any further assistance needed

## 2016-08-02 NOTE — Anesthesia Procedure Notes (Signed)
Procedure Name: Intubation Date/Time: 08/02/2016 12:55 PM Performed by: Rush Farmer E Pre-anesthesia Checklist: Patient identified, Emergency Drugs available, Suction available and Patient being monitored Patient Re-evaluated:Patient Re-evaluated prior to inductionOxygen Delivery Method: Circle system utilized Preoxygenation: Pre-oxygenation with 100% oxygen Intubation Type: IV induction Ventilation: Mask ventilation without difficulty Laryngoscope Size: Mac and 3 Grade View: Grade I Tube type: Oral Tube size: 7.0 mm Number of attempts: 1 Placement Confirmation: ETT inserted through vocal cords under direct vision,  CO2 detector and breath sounds checked- equal and bilateral Secured at: 21 cm Tube secured with: Tape Dental Injury: Teeth and Oropharynx as per pre-operative assessment

## 2016-08-02 NOTE — Anesthesia Preprocedure Evaluation (Addendum)
Anesthesia Evaluation  Patient identified by MRN, date of birth, ID band Patient awake    Reviewed: Allergy & Precautions, NPO status , Patient's Chart, lab work & pertinent test results  Airway Mallampati: I       Dental  (+) Teeth Intact   Pulmonary    breath sounds clear to auscultation       Cardiovascular hypertension,  Rhythm:Regular     Neuro/Psych    GI/Hepatic   Endo/Other    Renal/GU      Musculoskeletal   Abdominal   Peds  Hematology   Anesthesia Other Findings   Reproductive/Obstetrics                            Anesthesia Physical Anesthesia Plan  ASA: II  Anesthesia Plan: General and Regional   Post-op Pain Management: GA combined w/ Regional for post-op pain   Induction: Intravenous  Airway Management Planned: Oral ETT  Additional Equipment:   Intra-op Plan:   Post-operative Plan: Extubation in OR  Informed Consent: I have reviewed the patients History and Physical, chart, labs and discussed the procedure including the risks, benefits and alternatives for the proposed anesthesia with the patient or authorized representative who has indicated his/her understanding and acceptance.   Dental advisory given  Plan Discussed with: CRNA and Surgeon  Anesthesia Plan Comments:        Anesthesia Quick Evaluation

## 2016-08-02 NOTE — Anesthesia Postprocedure Evaluation (Signed)
Anesthesia Post Note  Patient: Charlotte [name]  Procedure(s) Performed: Procedure(s) (LRB): REVERSE SHOULDER ARTHROPLASTY (Right)  Patient location during evaluation: PACU Anesthesia Type: General and Regional Level of consciousness: awake Pain management: pain level controlled Vital Signs Assessment: post-procedure vital signs reviewed and stable Respiratory status: spontaneous breathing Cardiovascular status: stable Postop Assessment: no signs of nausea or vomiting Anesthetic complications: no    Last Vitals:  Vitals:   08/02/16 1654 08/02/16 1713  BP:  (!) 144/63  Pulse: 86 90  Resp:  16  Temp:  36.9 C    Last Pain:  Vitals:   08/02/16 1800  TempSrc:   PainSc: 0-No pain                 Kresta Templeman

## 2016-08-02 NOTE — Transfer of Care (Signed)
Immediate Anesthesia Transfer of Care Note  Patient: Markeyta Goodfellow  Procedure(s) Performed: Procedure(s): REVERSE SHOULDER ARTHROPLASTY (Right)  Patient Location: PACU  Anesthesia Type:GA combined with regional for post-op pain  Level of Consciousness: awake, alert  and oriented  Airway & Oxygen Therapy: Patient Spontanous Breathing and Patient connected to nasal cannula oxygen  Post-op Assessment: Report given to RN, Post -op Vital signs reviewed and stable and Patient moving all extremities X 4  Post vital signs: Reviewed and stable  Last Vitals:  Vitals:   08/02/16 1206 08/02/16 1503  BP: (!) 176/62 (!) 164/75  Pulse: 88 91  Resp: 17 13  Temp:  37 C    Last Pain:  Vitals:   08/02/16 1155  TempSrc:   PainSc: 0-No pain      Patients Stated Pain Goal: 4 ([id number])  Complications: No apparent anesthesia complications

## 2016-08-02 NOTE — Brief Op Note (Signed)
08/02/2016  2:51 PM  PATIENT:  Charlotte [name]  65 y.o. female  PRE-OPERATIVE DIAGNOSIS:  RIGHT SHOULDER ROTATOR CUFF ARTHROPATHY  POST-OPERATIVE DIAGNOSIS:  same   PROCEDURE:  Procedure(s): REVERSE SHOULDER ARTHROPLASTY (Right)  DePuy Delta Xtend  SURGEON:  Surgeon(s) and Role:    * Netta Cedars, MD - Primary  PHYSICIAN ASSISTANT:   ASSISTANTS: Ventura Bruns, PA-C   ANESTHESIA:   regional and general  EBL:  Total I/O In: -  Out: 200 [Blood:200]  BLOOD ADMINISTERED:none  DRAINS: none   LOCAL MEDICATIONS USED:  MARCAINE     SPECIMEN:  No Specimen  DISPOSITION OF SPECIMEN:  N/A  COUNTS:  YES  TOURNIQUET:  * No tourniquets in log *  DICTATION: .Other Dictation: Dictation Number [id number]  PLAN OF CARE: Admit to inpatient   PATIENT DISPOSITION:  PACU - hemodynamically stable.   Delay start of Pharmacological VTE agent (>24hrs) due to surgical blood loss or risk of bleeding: no

## 2016-08-02 NOTE — Progress Notes (Signed)
Pt sleeps when left alone, pain 4-5/10 when awakened, she appears much more comfortable. Dr Ermalene Postin fully updated-OK to use post op po OXY IR & Robaxin per orders and tx pt to room.

## 2016-08-02 NOTE — Discharge Instructions (Signed)
Ice to the shoulder as much as possible.  Keep the incision covered and clean and dry for one week, then ok to get it wet in the shower  May remove the sling as tolerated,  Ok to use the right arm and hand for light activity of daily living.  Do not push your full weight up out of a chair.  Do exercises at least 4 times per day  Follow up in two weeks in the office  [id number]

## 2016-08-03 LAB — BASIC METABOLIC PANEL
Anion gap: 8 (ref 5–15)
BUN: 15 mg/dL (ref 6–20)
CO2: 27 mmol/L (ref 22–32)
Calcium: 8.9 mg/dL (ref 8.9–10.3)
Chloride: 103 mmol/L (ref 101–111)
Creatinine, Ser: 0.97 mg/dL (ref 0.44–1.00)
GFR calc Af Amer: >60 mL/min (ref >60)
GFR calc non Af Amer: 60 mL/min — ABNORMAL LOW (ref >60)
Glucose, Bld: 132 mg/dL — ABNORMAL HIGH (ref 65–99)
Potassium: 3.8 mmol/L (ref 3.5–5.1)
Sodium: 138 mmol/L (ref 135–145)

## 2016-08-03 LAB — HEMOGLOBIN AND HEMATOCRIT, BLOOD
HCT: 36 % (ref 36.0–46.0)
Hemoglobin: 11.5 g/dL — ABNORMAL LOW (ref 12.0–15.0)

## 2016-08-03 NOTE — Progress Notes (Signed)
Occupational Therapy Treatment/Discharge Patient Details Name: Charlotte [name] MRN: [id number] DOB: 11-29-1950 Today's Date: 08/03/2016    History of present illness 65 y.o. female s/p R reverse shoulder arthroplasty. PMH significant for HTN, HLD, hypothyroidism, syncope, and asthma.    OT comments  Taught pt HEP for RUE and pt able to complete exercises with min assist and able to direct caregivers on how to assist. Pt able to recall all shoulder precautions and demonstrated understanding of compensatory strateiges for UB ADLs. All education has been completed and pt has no further questions. Pt with no further acute OT needs. OT signing off.    Follow Up Recommendations  No OT follow up;Supervision/Assistance - 24 hour    Equipment Recommendations  None recommended by OT    Recommendations for Other Services      Precautions / Restrictions Precautions Precautions: Shoulder Type of Shoulder Precautions: Active protocol Shoulder Interventions: Shoulder sling/immobilizer;Off for dressing/bathing/exercises Precaution Booklet Issued: Yes (comment) Precaution Comments: AROM shoulder FF 90, ABD 60, ER 30; AROM elbow/wrist/hand allowed Required Braces or Orthoses: Sling Restrictions Weight Bearing Restrictions: Yes Other Position/Activity Restrictions: NWB RUE       Mobility Bed Mobility Overal bed mobility: Needs Assistance Bed Mobility: Supine to Sit     Supine to sit: Min assist     General bed mobility comments: Pt up in recliner on OT arrival  Transfers Overall transfer level: Needs assistance Equipment used: None Transfers: Sit to/from Stand Sit to Stand: Supervision         General transfer comment: Supervision for safety.    Balance Overall balance assessment: Needs assistance Sitting-balance support: No upper extremity supported;Feet supported Sitting balance-Leahy Scale: Good     Standing balance support: No upper extremity supported;During functional  activity Standing balance-Leahy Scale: Good                     ADL Overall ADL's : Needs assistance/impaired         Upper Body Bathing: Moderate assistance;Sitting   Lower Body Bathing: Minimal assistance;Sit to/from stand   Upper Body Dressing : Moderate assistance;Sitting   Lower Body Dressing: Minimal assistance;Sit to/from stand   Toilet Transfer: Supervision/safety;Ambulation;Regular Toilet   Toileting- Water quality scientist and Hygiene: Supervision/safety;Sit to/from stand       Functional mobility during ADLs: Supervision/safety General ADL Comments: Educated pt on shoulder precautions, sling wear protocol, positioning for rest/sleep, compensatory strategies for UB ADLs, and fall prevention.       Vision                     Perception     Praxis      Cognition   Behavior During Therapy: WFL for tasks assessed/performed Overall Cognitive Status: Within Functional Limits for tasks assessed                       Extremity/Trunk Assessment  Upper Extremity Assessment Upper Extremity Assessment: RUE deficits/detail RUE Deficits / Details: decreased ROM and strength as expected post op RUE Coordination: decreased gross motor   Lower Extremity Assessment Lower Extremity Assessment: Overall WFL for tasks assessed   Cervical / Trunk Assessment Cervical / Trunk Assessment: Normal    Exercises Shoulder Exercises Shoulder Flexion: AAROM;PROM;Right;10 reps;Supine;Seated Shoulder ABduction: AAROM;PROM;Right;10 reps;Supine;Seated Shoulder External Rotation: AAROM;PROM;Right;10 reps;Supine;Seated Elbow Flexion: AROM;Right;10 reps;Supine;Seated Elbow Extension: AROM;Right;10 reps;Supine;Seated Wrist Flexion: AROM;Right;10 reps;Supine;Seated Wrist Extension: AROM;Right;10 reps;Supine;Seated Digit Composite Flexion: AROM;Right;10 reps;Supine;Seated Composite Extension: AROM;Right;10 reps;Seated;Supine Donning/doffing shirt without moving  shoulder:  Minimal assistance;Patient able to independently direct caregiver Method for sponge bathing under operated UE: Minimal assistance;Patient able to independently direct caregiver Donning/doffing sling/immobilizer: Minimal assistance;Patient able to independently direct caregiver Correct positioning of sling/immobilizer: Minimal assistance;Patient able to independently direct caregiver ROM for elbow, wrist and digits of operated UE: Supervision/safety Sling wearing schedule (on at all times/off for ADL's): Independent Proper positioning of operated UE when showering: Minimal assistance;Patient able to independently direct caregiver Positioning of UE while sleeping: Minimal assistance;Patient able to independently direct caregiver   Shoulder Instructions Shoulder Instructions Donning/doffing shirt without moving shoulder: Minimal assistance;Patient able to independently direct caregiver Method for sponge bathing under operated UE: Minimal assistance;Patient able to independently direct caregiver Donning/doffing sling/immobilizer: Minimal assistance;Patient able to independently direct caregiver Correct positioning of sling/immobilizer: Minimal assistance;Patient able to independently direct caregiver ROM for elbow, wrist and digits of operated UE: Supervision/safety Sling wearing schedule (on at all times/off for ADL's): Independent Proper positioning of operated UE when showering: Minimal assistance;Patient able to independently direct caregiver Positioning of UE while sleeping: Minimal assistance;Patient able to independently direct caregiver     General Comments      Pertinent Vitals/ Pain       Pain Assessment: 0-10 Pain Score: 6  Pain Location: R shoulder Pain Descriptors / Indicators: Aching;Sore Pain Intervention(s): Limited activity within patient's tolerance;Monitored during session;Premedicated before session;Repositioned  Home Living Family/patient expects to be  discharged to:: Private residence Living Arrangements: Other relatives (sister and daughter) Available Help at Discharge: Family;Available 24 hours/day Type of Home: House Home Access: Stairs to enter [employer] of Steps: 4 Entrance Stairs-Rails: Right;Left Home Layout: One level     Bathroom Shower/Tub: Occupational psychologist: Handicapped height     Home Equipment: [place] held shower head;Adaptive equipment Adaptive Equipment: Long-handled sponge        Prior Functioning/Environment Level of Independence: Independent            Frequency  Min 2X/week        Progress Toward Goals  OT Goals(current goals can now be found in the care plan section)  Progress towards OT goals: Goals met/education completed, patient discharged from OT  Acute Rehab OT Goals Patient Stated Goal: to go home OT Goal Formulation: With patient Time For Goal Achievement: 08/17/16 Potential to Achieve Goals: Good ADL Goals Pt Will Perform Upper Body Bathing: with min assist;sitting Pt Will Perform Upper Body Dressing: with min assist;sitting Pt Will Perform Lower Body Dressing: with min assist;sit to/from stand Pt Will Transfer to Toilet: with supervision;ambulating;regular height toilet Pt Will Perform Toileting - Clothing Manipulation and hygiene: with supervision;sit to/from stand Pt/caregiver will Perform Home Exercise Program: Increased ROM;Right Upper extremity;With Supervision;With written HEP provided  Plan All goals met and education completed, patient discharged from OT services    Co-evaluation                 End of Session Equipment Utilized During Treatment: Other (comment) (sling)   Activity Tolerance Patient tolerated treatment well   Patient Left in chair;with call bell/phone within reach   Nurse Communication Mobility status        Time: 0569-7948 OT Time Calculation (min): 13 min  Charges: OT General Charges $OT Visit:  1 Procedure OT Evaluation $OT Eval Moderate Complexity: 1 Procedure OT Treatments $Self Care/Home Management : 8-22 mins $Therapeutic Exercise: 8-22 mins  Redmond Baseman, OTR/L Pager: [phone] 08/03/2016, 11:05 AM

## 2016-08-03 NOTE — Discharge Summary (Signed)
Physician Discharge Summary   Patient ID: Charlotte [name] MRN: [id number] DOB/AGE: 03-02-1951 65 y.o.  Admit date: 08/02/2016 Discharge date: 08/03/2016  Admission Diagnoses:  Active Problems:   S/P shoulder replacement   Discharge Diagnoses:  Same   Surgeries: Procedure(s): REVERSE SHOULDER ARTHROPLASTY on 08/02/2016   Consultants: OT  Discharged Condition: Stable  Hospital Course: Charlotte [name] is an 64 y.o. female who was admitted 08/02/2016 with a chief complaint of right shoulder pain, and found to have a diagnosis of right shoulder OA and RC insufficiency.  They were brought to the operating room on 08/02/2016 and underwent the above named procedures.    The patient had an uncomplicated hospital course and was stable for discharge.  Recent vital signs:  Vitals:   08/03/16 0025 08/03/16 0424  BP: 115/68 (!) 128/54  Pulse: 82 76  Resp:    Temp: 98.5 F (36.9 C) 98.2 F (36.8 C)    Recent laboratory studies:  Results for orders placed or performed during the hospital encounter of 08/02/16  Hemoglobin and hematocrit, blood  Result Value Ref Range   Hemoglobin 11.5 (L) 12.0 - 15.0 g/dL   HCT 36.0 36.0 - [id number] %  Basic metabolic panel  Result Value Ref Range   Sodium 138 135 - 145 mmol/L   Potassium 3.8 3.5 - 5.1 mmol/L   Chloride 103 101 - 111 mmol/L   CO2 27 22 - 32 mmol/L   Glucose, Bld 132 (H) 65 - 99 mg/dL   BUN 15 6 - 20 mg/dL   Creatinine, Ser 0.97 0.44 - 1.00 mg/dL   Calcium 8.9 8.9 - 10.3 mg/dL   GFR calc non Af Amer 60 (L) >60 mL/min   GFR calc Af Amer >60 >60 mL/min   Anion gap 8 5 - 15    Discharge Medications:     Medication List    TAKE these medications   albuterol 108 (90 Base) MCG/ACT inhaler Commonly known as:  PROVENTIL HFA;VENTOLIN HFA Inhale 1 puff into the lungs every 6 (six) hours as needed for wheezing or shortness of breath.   aspirin 81 MG tablet Take 81 mg by mouth daily.   BEVESPI AEROSPHERE 9-4.8 MCG/ACT Aero Generic drug:   Glycopyrrolate-Formoterol Inhale 2 Doses into the lungs 2 (two) times daily.   Calcium-Vitamin D 500-400 MG-UNIT Tabs Take 1 tablet by mouth 2 (two) times daily.   furosemide 20 MG tablet Commonly known as:  LASIX Take 0.5 tablets (10 mg total) by mouth daily. What changed:  how much to take   levothyroxine 50 MCG tablet Commonly known as:  SYNTHROID, LEVOTHROID Take 1 tablet by mouth daily.   lisinopril-hydrochlorothiazide 20-12.5 MG tablet Commonly known as:  PRINZIDE,ZESTORETIC Take 1 tablet by mouth daily.   methocarbamol 500 MG tablet Commonly known as:  ROBAXIN Take 1 tablet (500 mg total) by mouth 3 (three) times daily as needed.   oxyCODONE-acetaminophen 5-325 MG tablet Commonly known as:  ROXICET Take 1-2 tablets by mouth every 4 (four) hours as needed for severe pain.   sertraline 100 MG tablet Commonly known as:  ZOLOFT Take 1 tablet by mouth daily.   simvastatin 40 MG tablet Commonly known as:  ZOCOR Take 1 tablet by mouth daily.   traMADol 50 MG tablet Commonly known as:  ULTRAM Take 1-2 tablets by mouth every 6 (six) hours as needed (pain).       Diagnostic Studies: Dg Shoulder Right Port  Result Date: 08/02/2016 CLINICAL DATA:  65 year old female status post right  shoulder replacement. Initial encounter. EXAM: PORTABLE RIGHT SHOULDER COMPARISON:  Chest radiograph 01/01/2016. FINDINGS: Portable AP view at 1600 hours. Right total shoulder arthroplasty hardware in place, appears intact and normally aligned. No unexpected osseous changes identified. Postoperative changes to the surrounding soft tissues. Lower lung volumes, otherwise visible right lung appears stable. IMPRESSION: Right total shoulder arthroplasty with no adverse features. Electronically Signed   By: Genevie Ann M.D.   On: 08/02/2016 16:34    Disposition: home    Follow-up Information    Debar Plate,STEVEN R, MD. Call in 2 weeks.   Specialty:  Orthopedic Surgery Why:  [phone] Contact  information: [address] [place] [address] [phone]            Signed: Augustin Schooling 08/03/2016, 7:16 AM

## 2016-08-03 NOTE — Op Note (Signed)
NAMESERENITIE, KAMEI NO.:  [id number]  MEDICAL RECORD NO.:  [id number]  LOCATION:  5N04C                        FACILITY:  [facility]  PHYSICIAN:  Doran Heater. Veverly Fells, M.D. DATE OF BIRTH:  05-Jan-1951  DATE OF PROCEDURE:  08/02/2016 DATE OF DISCHARGE:                              OPERATIVE REPORT   PREOPERATIVE DIAGNOSES:  Right shoulder rotator cuff tear arthropathy and end-stage arthritis.  POSTOPERATIVE DIAGNOSES:  Right shoulder rotator cuff tear arthropathy and end-stage arthritis.  PROCEDURE PERFORMED:  Right shoulder reverse total shoulder arthroplasty using DePuy Delta Xtend prosthesis.  ATTENDING SURGEON:  Doran Heater. Veverly Fells, MD.  ASSISTANT:  Abbott Pao. Dixon, PA-C, who scrubbed the entire procedure and necessary for satisfactory completion of surgery.  ANESTHESIA:  General anesthesia was used plus interscalene block.  ESTIMATED BLOOD LOSS:  150 mL.  FLUID REPLACEMENT:  1200 mL crystalloid.  INSTRUMENT COUNTS:  Correct.  COMPLICATIONS:  No complications.  ANTIBIOTICS:  Perioperative antibiotics were given.  INDICATIONS:  The patient is a 65 year old female with worsening right shoulder pain and dysfunction secondary to rotator cuff tear arthropathy.  The patient has failed all measures of conservative management and desires operative treatment to restore function and eliminate the pain.  Informed consent obtained.  DESCRIPTION OF PROCEDURE:  After an adequate level of anesthesia was achieved, the patient was positioned in a modified beach-chair position. Right shoulder was correctly identified, sterilely prepped and draped in a usual manner.  Time-out was called.  We then entered the shoulder using a standard deltopectoral incision, started at the coracoid process extending down to the anterior humerus.  Dissection down through the subcutaneous tissues using Bovie, identified the cephalic vein, and took it laterally with the deltoid, pectoralis  taken medially.  Conjoined tendon identified and retracted medially.  We identified the damaged rotator cuff.  Basically, there was no supraspinatus or infraspinatus tendon.  The subscapularis was intact.  We released it with a peel subperiosteally off the lesser tuberosity and tagged for repair at the end.  We released the inferior capsule progressively externally rotating.  We then extended the shoulder delivering the humeral head out of the wound.  We entered the shoulder proximally using a 6 mm reamer and reamed up to a size 10.  We placed our 10 intramedullary resection guide for the head resection, resected the head to 10 degrees of retroversion.  Once we had the head resected, we then removed the excess osteophytes and we prepared the metaphysis with the epi-1 right reamer. We then placed our 10 body epi-1 right proximal component, set on the 0 setting and placed in 10 degrees of retroversion.  We impacted that in position, retracted the humerus posteriorly, did a 360-degree glenoid exposure, removed excess cartilage on the glenoid with a Cobb elevator. We then did a glenoid labrum removal and capsular release.  We had excellent exposure.  We placed our centered guide pin into the center of the glenoid.  We then reamed for the metaglene and drilled our central peg for the metaglene.  We impacted the metaglene into position, placed a 36 screw inferiorly, a 36 at the base of the coracoid, and an 18 posteriorly, could not get  one anteriorly.  We had 3 good screws, nice and secure.  We then placed a 38 standard glenosphere in position, impacted that, and screwed at home.  Next, we placed a 38, +3 trial and reduced it.  The shoulder had nice soft tissue balancing.  We removed the trial component from the humerus.  We placed drill holes and #2 FiberWire for repair of the subscapularis and lesser tuberosity.  Next, we used impaction grafting technique with available bone graft in  the humeral head and take our press-fit HA coated, stem size 10, and size 1 right, metaphyseal component again on the 0 setting and then placed in 10 degrees of retroversion.  With that impacted into position with good bone grafting and nice and secure, we then selected the +3 trial, felt like we could go just a little tighter with +6, so we selected the real 38, +6 poly and impacted that in position, reduced the shoulder, had excellent stability with no gapping with pull on the elbow, no gapping with external rotation, and tight conjoined.  We irrigated thoroughly. We repaired the subscapularis anatomically back to bone that allowed [place] to externally rotate the shoulder to 45 degrees.  We repaired the deltopectoral interval with 0 Vicryl suture, followed by 2-0 Vicryl for the subcutaneous closure, and 4-0 Monocryl for skin.  Steri-Strips applied followed by a sterile dressing.  The patient tolerated the surgery well.     Doran Heater. Veverly Fells, M.D.     SRN/MEDQ  D:  08/02/2016  T:  08/03/2016  Job:  [id number]

## 2016-08-03 NOTE — Evaluation (Signed)
Occupational Therapy Evaluation Patient Details Name: Charlotte [name] MRN: [id number] DOB: 23-Jun-1951 Today's Date: 08/03/2016    History of Present Illness 65 y.o. female s/p R reverse shoulder arthroplasty. PMH significant for HTN, HLD, hypothyroidism, syncope, and asthma.    Clinical Impression   PTA, pt was independent with ADLs and mobility. Pt currently requires mod assist for bathing and dressing tasks and supervision for basic transfers due to acute R shoulder pain and mild balance deficits. Educated pt on NWB status of RUE, sling wear protocol, compensatory strategies for UB bathing and dressing, and fall prevention strategies. Pt plans to d/c home with 24/7 assistance from her daughter and sister. Pt will benefit from continued acute OT to increase independence and safety with ADLs and mobility to allow for safe discharge home. No OT follow up or DME recommended at this time.    Follow Up Recommendations  No OT follow up;Supervision/Assistance - 24 hour    Equipment Recommendations  None recommended by OT    Recommendations for Other Services       Precautions / Restrictions Precautions Precautions: Shoulder Type of Shoulder Precautions: Active protocol Shoulder Interventions: Shoulder sling/immobilizer;Off for dressing/bathing/exercises Precaution Booklet Issued: Yes (comment) Precaution Comments: AROM shoulder FF 90, ABD 60, ER 30; AROM elbow/wrist/hand allowed Required Braces or Orthoses: Sling Restrictions Weight Bearing Restrictions: Yes Other Position/Activity Restrictions: NWB RUE      Mobility Bed Mobility Overal bed mobility: Needs Assistance Bed Mobility: Supine to Sit     Supine to sit: Min assist     General bed mobility comments: Handheld assist to come to sitting on EOB. HOB flat, no use of bedrails, exited on L side of bed to simulate home environment.  Transfers Overall transfer level: Needs assistance Equipment used: None Transfers: Sit  to/from Stand Sit to Stand: Supervision         General transfer comment: Supervision for safety.    Balance Overall balance assessment: Needs assistance Sitting-balance support: No upper extremity supported;Feet supported Sitting balance-Leahy Scale: Good     Standing balance support: No upper extremity supported;During functional activity Standing balance-Leahy Scale: Good                              ADL Overall ADL's : Needs assistance/impaired         Upper Body Bathing: Moderate assistance;Sitting   Lower Body Bathing: Minimal assistance;Sit to/from stand   Upper Body Dressing : Moderate assistance;Sitting   Lower Body Dressing: Minimal assistance;Sit to/from stand   Toilet Transfer: Supervision/safety;Ambulation;Regular Toilet   Toileting- Water quality scientist and Hygiene: Supervision/safety;Sit to/from stand       Functional mobility during ADLs: Supervision/safety General ADL Comments: Educated pt on shoulder precautions, sling wear protocol, positioning for rest/sleep, compensatory strategies for UB ADLs, and fall prevention.      Vision Vision Assessment?: No apparent visual deficits   Perception     Praxis      Pertinent Vitals/Pain Pain Assessment: 0-10 Pain Score: 8  Pain Location: R shoulder Pain Descriptors / Indicators: Aching;Sore Pain Intervention(s): Limited activity within patient's tolerance;Monitored during session;Premedicated before session;Repositioned     Hand Dominance Right   Extremity/Trunk Assessment Upper Extremity Assessment Upper Extremity Assessment: RUE deficits/detail RUE Deficits / Details: decreased ROM and strength as expected post op RUE Coordination: decreased gross motor   Lower Extremity Assessment Lower Extremity Assessment: Overall WFL for tasks assessed   Cervical / Trunk Assessment Cervical / Trunk Assessment: Normal  Communication Communication Communication: No difficulties    Cognition Arousal/Alertness: Awake/alert Behavior During Therapy: WFL for tasks assessed/performed Overall Cognitive Status: Within Functional Limits for tasks assessed                     General Comments       Exercises Exercises: Shoulder     Shoulder Instructions Shoulder Instructions Donning/doffing shirt without moving shoulder: Moderate assistance Method for sponge bathing under operated UE: Moderate assistance Donning/doffing sling/immobilizer: Moderate assistance Correct positioning of sling/immobilizer: Moderate assistance Sling wearing schedule (on at all times/off for ADL's): Independent Proper positioning of operated UE when showering: Moderate assistance Positioning of UE while sleeping: Moderate assistance    Home Living Family/patient expects to be discharged to:: Private residence Living Arrangements: Other relatives (sister and daughter) Available Help at Discharge: Family;Available 24 hours/day Type of Home: House Home Access: Stairs to enter [employer] of Steps: 4 Entrance Stairs-Rails: Right;Left Home Layout: One level     Bathroom Shower/Tub: Occupational psychologist: Handicapped height     Home Equipment: Ila held shower head;Adaptive equipment Adaptive Equipment: Long-handled sponge        Prior Functioning/Environment Level of Independence: Independent                 OT Problem List: Decreased strength;Decreased range of motion;Decreased activity tolerance;Impaired balance (sitting and/or standing);Decreased safety awareness;Decreased knowledge of use of DME or AE;Decreased knowledge of precautions;Pain;Impaired UE functional use   OT Treatment/Interventions: Self-care/ADL training;Therapeutic exercise;DME and/or AE instruction;Therapeutic activities;Patient/family education;Balance training    OT Goals(Current goals can be found in the care plan section) Acute Rehab OT Goals Patient Stated  Goal: to go home OT Goal Formulation: With patient Time For Goal Achievement: 08/17/16 Potential to Achieve Goals: Good ADL Goals Pt Will Perform Upper Body Bathing: with min assist;sitting Pt Will Perform Upper Body Dressing: with min assist;sitting (including donning/doffing sling) Pt Will Perform Lower Body Dressing: with min assist;sit to/from stand Pt Will Transfer to Toilet: with supervision;ambulating;regular height toilet Pt Will Perform Toileting - Clothing Manipulation and hygiene: with supervision;sit to/from stand Pt/caregiver will Perform Home Exercise Program: Increased ROM;Right Upper extremity;With Supervision;With written HEP provided  OT Frequency: Min 2X/week   Barriers to D/C:            Co-evaluation              End of Session Equipment Utilized During Treatment: Other (comment) (sling) Nurse Communication: Mobility status  Activity Tolerance: Patient tolerated treatment well Patient left: in chair;with call bell/phone within reach   Time: [id number] OT Time Calculation (min): 28 min Charges:  OT General Charges $OT Visit: 1 Procedure OT Evaluation $OT Eval Moderate Complexity: 1 Procedure OT Treatments $Self Care/Home Management : 8-22 mins G-Codes:    Redmond Baseman, OTR/L Pager: [phone] 08/03/2016, 10:59 AM

## 2016-08-03 NOTE — Progress Notes (Signed)
Orthopedics Progress Note  Subjective: I had a rough night.  Shoulder is painful  Objective:  Vitals:   08/03/16 0025 08/03/16 0424  BP: 115/68 (!) 128/54  Pulse: 82 76  Resp:    Temp: 98.5 F (36.9 C) 98.2 F (36.8 C)    General: Awake and alert  Musculoskeletal: right shoulder dressing changed to gel bandage, incision benign Neurovascularly intact  Lab Results  Component Value Date   WBC 5.7 07/25/2016   HGB 11.5 (L) 08/03/2016   HCT 36.0 08/03/2016   MCV 90.5 07/25/2016   PLT 182 07/25/2016       Component Value Date/Time   NA 138 08/03/2016 0254   K 3.8 08/03/2016 0254   CL 103 08/03/2016 0254   CO2 27 08/03/2016 0254   GLUCOSE 132 (H) 08/03/2016 0254   BUN 15 08/03/2016 0254   CREATININE 0.97 08/03/2016 0254   CALCIUM 8.9 08/03/2016 0254   GFRNONAA 60 (L) 08/03/2016 0254   GFRAA >60 08/03/2016 0254    No results found for: INR, PROTIME  Assessment/Plan: POD #1 s/p Procedure(s): REVERSE SHOULDER ARTHROPLASTY OT, mobilization Possible discharge home later today if comfortable  Remo Lipps R. Veverly Fells, MD 08/03/2016 7:15 AM

## 2016-08-05 ENCOUNTER — Encounter (HOSPITAL_COMMUNITY): Payer: Self-pay | Admitting: Orthopedic Surgery

## 2016-08-07 MED ORDER — BUPIVACAINE-EPINEPHRINE (PF) 0.5% -1:200000 IJ SOLN: INTRAMUSCULAR | Status: DC | PRN

## 2016-08-07 NOTE — Addendum Note (Signed)
Addendum  created 08/07/16 1933 by Oleta Mouse, MD   Anesthesia Intra Blocks edited, Anesthesia Intra Meds edited, Child order released for a procedure order, Delete clinical note, Order Canceled from Note, Sign clinical note

## 2016-08-07 NOTE — Anesthesia Procedure Notes (Deleted)
Anesthesia Regional Block:  Interscalene brachial plexus block  Pre-Anesthetic Checklist: ,, timeout performed, Correct Patient, Correct Site, Correct Laterality, Correct Procedure, Correct Position, site marked, Risks and benefits discussed,  Surgical consent,  Pre-op evaluation,  At surgeon's request and post-op pain management  Laterality: Right  Prep: chloraprep       Needles:  Injection technique: Single-shot  Needle Type: Echogenic Stimulator Needle     Needle Length: 5cm 5 cm Needle Gauge: 22 and 22 G    Additional Needles:  Procedures: ultrasound guided (picture in chart) and nerve stimulator Interscalene brachial plexus block  Nerve Stimulator or Paresthesia:  Response: bicep contraction, 0.45 mA,   Additional Responses:   Narrative:  Injection made incrementally with aspirations every 5 mL.  Performed by: Personally  Anesthesiologist: Terea Neubauer  Additional Notes: Functioning IV was confirmed and monitors applied.  A 19mm 22ga echogenic arrow stimulator was used. Sterile prep and drape,hand hygiene and sterile gloves were used.Ultrasound guidance: relevant anatomy identified, needle position confirmed, local anesthetic spread visualized around nerve(s)., vascular puncture avoided.  Image printed for medical record.  Negative aspiration and negative test dose prior to incremental administration of local anesthetic. The patient tolerated the procedure well.

## 2016-09-04 NOTE — Anesthesia Procedure Notes (Signed)
Anesthesia Regional Block:  Interscalene brachial plexus block  Pre-Anesthetic Checklist: ,, timeout performed, Correct Patient, Correct Site, Correct Laterality, Correct Procedure, Correct Position, site marked, Risks and benefits discussed,  Surgical consent,  Pre-op evaluation,  At surgeon's request and post-op pain management  Laterality: Right  Prep: Maximum Sterile Barrier Precautions used, chloraprep, alcohol swabs       Needles:  Injection technique: Single-shot  Needle Type: Stimulator Needle - 40          Additional Needles: Interscalene brachial plexus block  Nerve Stimulator or Paresthesia:  Response: 0.5 mA, 0.1 ms, 4 cm  Additional Responses:   Narrative:  Start time: 09/04/2016 12:30 PM End time: 09/04/2016 12:38 PM Injection made incrementally with aspirations every 5 mL.  Performed by: Personally  Anesthesiologist: Kate Sable  Additional Notes: Pt accepts procedure w/ risks. 20cc 0.5% marcaine w/ epi w/o difficulty or dis comfort. GES

## 2016-09-04 NOTE — Addendum Note (Signed)
Addendum  created 09/04/16 1817 by Kate Sable, MD   Anesthesia Intra Blocks edited, Child order released for a procedure order, Order Canceled from Note, Sign clinical note

## 2016-10-07 ENCOUNTER — Encounter: Payer: Self-pay | Admitting: Cardiovascular Disease

## 2016-10-07 ENCOUNTER — Ambulatory Visit (INDEPENDENT_AMBULATORY_CARE_PROVIDER_SITE_OTHER): Payer: Medicare Other | Admitting: Cardiovascular Disease

## 2016-10-07 VITALS — BP 125/70 | HR 72 | Ht 67.0 in | Wt 205.4 lb

## 2016-10-07 DIAGNOSIS — E034 Atrophy of thyroid (acquired): Secondary | ICD-10-CM

## 2016-10-07 DIAGNOSIS — E785 Hyperlipidemia, unspecified: Secondary | ICD-10-CM

## 2016-10-07 DIAGNOSIS — G4733 Obstructive sleep apnea (adult) (pediatric): Secondary | ICD-10-CM | POA: Diagnosis not present

## 2016-10-07 DIAGNOSIS — I10 Essential (primary) hypertension: Secondary | ICD-10-CM

## 2016-10-07 NOTE — Patient Instructions (Signed)
Your physician wants you to follow-up in: 1 year or sooner if needed in a sleep clinic. You will receive a reminder letter in the mail two months in advance. If you don't receive a letter, please call our office to schedule the follow-up appointment.

## 2016-10-09 NOTE — Progress Notes (Signed)
PCP: Dr. [name] Primary cardiologist: Dr. Sallyanne Kuster   HPI: Charlotte [name] is a 65 y.o. female was recently found to have obstructive sleep apnea.  She presents for a follow-up sleep clinic evaluation following CPAP initiation.  Charlotte [name] has a history of asthma, hypertension, hyperlipidemia, hypothyroidism, and is followed by Dr. Sallyanne Kuster for cardiology care.  She was referred for a sleep study due to concerns of obstructive sleep apnea.  She underwent a split-night study on 04/01/2016.  This demonstrated severe obstructive sleep apnea with an AHI of 32.7 per hour.  She had a significant positional component with supine sleep AHI at 42.4 per hour.  She was unable to achieve any REM sleep on the baseline portion of the study.  CPAP titration was initiated titrated up to 11 cm water pressure.  During her study .  She had soft snoring.  There was a significantly abnormal arousal index area.  She was in sinus rhythm and had a rare P EC.  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  She was set up with CPAP on 05/03/2016.  A download was obtained from 05/20/2016 through August 1 17.  She had only 27% of days of usage and only 17% of days greater than 4 hours of use.  She was only averaging 4 hours and 24 minutes.  On 11 cm water pressure, her AHI was 4.0, with an apnea index was 3.8 with a hypopnea index of 0.2.  Since she underwent shoulder surgery  she has been sleeping in a recliner since her surgery.  She has had pain in her shoulder which has been limiting her sleep.  An Epworth sleepiness scale score was calculated at her last visit on 06/26/2016 as shown below and this endorsed at 16 compatible with significant excessive daytime sleepiness.  Epworth Sleepiness Scale: Situation   Chance of Dozing/Sleeping (0 = never , 1 = slight chance , 2 = moderate chance , 3 = high chance )   sitting and reading 2   watching TV 2   sitting inactive in a  public place 2   being a passenger in a motor vehicle for an hour or more 2   lying down in the afternoon 2   sitting and talking to someone 2   sitting quietly after lunch (no alcohol) 2   while stopped for a few minutes in traffic as the driver 2   Total Score  16   When she was last seen, she was not compliant and based on her download .  I adjusted her CPAP pressure.  She now is on an auto mode with a minimum pressure of 6, and maximum pressure of 20.  A new download was obtained from 08/28/2016 through 09/26/2016.  She is now meeting Medicare compliance standards with usage stays at 83% and days greater than 4 hours of use at 73%.  She is averaging 7 hours and 7 minutes on days used.  Her 95th percentile pressure was 10.1 with a maximum average pressure of 11.4.  Her AHI is now excellent at 2.5.  She is using a ResMed Airfit F 20 small mask. She presents for evaluation.    Past Medical History:  Diagnosis Date  . Anxiety   . Arthritis   . Asthma 01/27/2016  . Depression   . Essential hypertension 01/27/2016  . GERD (gastroesophageal reflux disease)   . Hyperlipidemia 01/27/2016  . Hypothyroidism 01/27/2016  . Shortness  of breath dyspnea    relating to asthma  . Sleep apnea   . Syncope 01/27/2016    Past Surgical History:  Procedure Laterality Date  . ABDOMINAL HYSTERECTOMY    . BREAST REDUCTION SURGERY  2011  . COLONOSCOPY    . ESOPHAGOGASTRODUODENOSCOPY    . REVERSE SHOULDER ARTHROPLASTY Right 08/02/2016   Procedure: REVERSE SHOULDER ARTHROPLASTY;  Surgeon: Netta Cedars, MD;  Location: [facility];  Service: Orthopedics;  Laterality: Right;    Allergies  Allergen Reactions  . No Known Allergies     Current Outpatient Prescriptions  Medication Sig Dispense Refill  . albuterol (PROVENTIL HFA;VENTOLIN HFA) 108 (90 Base) MCG/ACT inhaler Inhale 1 puff into the lungs every 6 (six) hours as needed for wheezing or shortness of breath.    Marland Kitchen aspirin 81 MG tablet Take 81 mg by mouth  daily.    . Calcium Carb-Cholecalciferol (CALCIUM-VITAMIN D) 500-400 MG-UNIT TABS Take 1 tablet by mouth 2 (two) times daily.    . furosemide (LASIX) 20 MG tablet Take 0.5 tablets (10 mg total) by mouth daily. (Patient taking differently: Take 20 mg by mouth daily. ) 90 tablet 3  . Glycopyrrolate-Formoterol (BEVESPI AEROSPHERE) 9-4.8 MCG/ACT AERO Inhale 2 Doses into the lungs 2 (two) times daily.    Marland Kitchen levothyroxine (SYNTHROID, LEVOTHROID) 50 MCG tablet Take 1 tablet by mouth daily.    Marland Kitchen lisinopril-hydrochlorothiazide (PRINZIDE,ZESTORETIC) 20-12.5 MG tablet Take 1 tablet by mouth daily. 90 tablet 3  . methocarbamol (ROBAXIN) 500 MG tablet Take 1 tablet (500 mg total) by mouth 3 (three) times daily as needed. 60 tablet 1  . sertraline (ZOLOFT) 100 MG tablet Take 1 tablet by mouth daily.    . simvastatin (ZOCOR) 40 MG tablet Take 1 tablet by mouth daily.    . traMADol (ULTRAM) 50 MG tablet Take 1-2 tablets by mouth every 6 (six) hours as needed (pain).   0   No current facility-administered medications for this visit.     Social History   Social History  . Marital status: Widowed    Spouse name: N/A  . Number of children: N/A  . Years of education: N/A   Occupational History  . Not on file.   Social History Main Topics  . Smoking status: Never Smoker  . Smokeless tobacco: Never Used  . Alcohol use No  . Drug use: No  . Sexual activity: Not on file   Other Topics Concern  . Not on file   Social History Narrative   Epworth Sleepiness Scale = 17 (as of 02/16/2016)    Family History  Problem Relation Age of Onset  . Heart failure Mother   . Diabetes Mother   . Hypertension Mother   . Hyperlipidemia Mother   . Lung cancer Father   . Diabetes Father   . Hypertension Father   . Hypertension Sister   . Hyperlipidemia Sister   . Hyperthyroidism Sister   . Lung cancer Brother   . Rheum arthritis Child   . Lung cancer Child   . Heart disease Maternal Grandfather   . Lung  cancer Paternal Grandfather   . Diabetes Brother   . Hyperlipidemia Brother   . Hyperlipidemia Brother      ROS General: Negative; No fevers, chills, or night sweats HEENT: Negative; No changes in vision or hearing, sinus congestion, difficulty swallowing Pulmonary: Positive for asthma Cardiovascular: Negative; No chest pain, presyncope, syncope, palpatations GI: Negative; No nausea, vomiting, diarrhea, or abdominal pain GU: Negative; No dysuria, hematuria, or  difficulty voiding Musculoskeletal: Negative; no myalgias, joint pain, or weakness Hematologic: Negative; no easy bruising, bleeding Endocrine: Positive for hypothyroidism. Neuro: Negative; no changes in balance, headaches Skin: Negative; No rashes or skin lesions Psychiatric: Negative; No behavioral problems, depression Sleep: Positive for OSA.  Recently started on CPAP with poor compliance.  Positive for daytime sleepiness, and hypersomnolence; no bruxism, restless legs, hypnogognic hallucinations, no cataplexy   Physical Exam BP 125/70   Pulse 72   Ht 5\' 7"  (1.702 m)   Wt 205 lb 6.4 oz (93.2 kg)   BMI 32.17 kg/m   Wt Readings from Last 3 Encounters:  10/07/16 205 lb 6.4 oz (93.2 kg)  08/02/16 200 lb (90.7 kg)  07/25/16 200 lb 4.8 oz (90.9 kg)   General: Alert, oriented, no distress.  Skin: normal turgor, no rashes HEENT: Normocephalic, atraumatic. Pupils round and reactive; sclera anicteric; extraocular muscles intact; Fundi Without hemorrhages or exudates. Nose without nasal septal hypertrophy Mouth/Parynx benign; Mallinpatti scale 3 Neck: No JVD, no carotid bruits Lungs: clear to ausculatation and percussion; no wheezing or rales  Chest wall: No tenderness to palpation Heart: RRR, s1 s2 normal; 2/ 6 early peaking systolic murmu; no S3 gallop. Abdomen: soft, nontender; no hepatosplenomehaly, BS+; abdominal aorta nontender and not dilated by palpation. Back: No CVA tenderness Pulses 2+ Extremities: no clubbinbg  cyanosis or edema, Homan's sign negative  Neurologic: grossly nonfocal; cranial nerves intact. Psychological: Normal affect and mood.  ECG (independently read by me): Not done today, but the ECG from 04/22/2016 was reviewed which showed sinus rhythm with inferolateral ST changes  LABS:  BMP Latest Ref Rng & Units 08/03/2016 07/25/2016 11/08/2010  Glucose 65 - 99 mg/dL 132(H) 105(H) -  BUN 6 - 20 mg/dL 15 9 -  Creatinine 0.44 - 1.00 mg/dL 0.97 0.86 -  Sodium 135 - 145 mmol/L 138 141 -  Potassium 3.5 - 5.1 mmol/L 3.8 4.1 2.8(L)  Chloride 101 - 111 mmol/L 103 108 -  CO2 22 - 32 mmol/L 27 25 -  Calcium 8.9 - 10.3 mg/dL 8.9 9.2 -     No flowsheet data found.   CBC Latest Ref Rng & Units 08/03/2016 07/25/2016 11/08/2010  WBC 4.0 - 10.5 K/uL - 5.7 -  Hemoglobin 12.0 - 15.0 g/dL 11.5(L) 11.7(L) 12.9  Hematocrit 36.0 - 46.0 % 36.0 36.4 38.0  Platelets 150 - 400 K/uL - 182 -     Lipid Panel  No results found for: CHOL, TRIG, HDL, CHOLHDL, VLDL, LDLCALC, LDLDIRECT   RADIOLOGY: No results found.    ASSESSMENT AND PLAN: [name] is a 65 year old female who has a history of hypertension, asthma, hypothyroidism, hyperlipidemia, and was  found to have severe obstructive sleep apnea on her sleep study from May 2017.  On the diagnostic portion of the study, her AHI was 32.7 per hour but her RDI was significantly increased at 67.8 per hour.  On this study there was absence of REM sleep  She was titrated up to 11 cm water pressure with excellent benefit.  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 When I last saw her I reviewed her download from July 3 through 06/18/2016 and she was not meeting compliance requirements.   Recently, she is now on an AutoSet mode and on her most recent download is meeting compliance standards.  Her maximum average pressure was 11.4 with a 95th percentile pressure at 10.1.  She's not having any significant leak but on  6  days there was mild to moderate leak.  Her Epworth Sleepiness Scale score today is good at 7 argue against residual daytime sleepiness.  She is unaware of any breakthrough snoring.  She feels significantly better.  She denies shortness of breath.  She has more energy.  Previous nocturia essentially has resolved.  I spent considerable time with her today again discussing the importance of continued CPAP use and its effect on cardiovascular health.  If left untreated.  I answered all her questions.  Her blood pressure today was controlled on lisinopril HCT 20/12.5 at 125/70.  She continues to take simvastatin 40 mg for hyperlipidemia.  She has a history of hypothyroidism and continues to take levothyroxine.  I will see her in one year from a sleep perspective for further evaluation.  Troy Sine, MD, [facility]  10/09/2016 7:23 PM

## 2016-11-19 ENCOUNTER — Other Ambulatory Visit: Payer: Self-pay | Admitting: Cardiovascular Disease

## 2016-11-19 NOTE — Telephone Encounter (Signed)
Rx has been sent to the pharmacy electronically. ° °

## 2016-11-28 ENCOUNTER — Other Ambulatory Visit: Payer: Self-pay | Admitting: Family Medicine

## 2016-11-28 DIAGNOSIS — Z9889 Other specified postprocedural states: Secondary | ICD-10-CM

## 2016-11-28 DIAGNOSIS — Z1231 Encounter for screening mammogram for malignant neoplasm of breast: Secondary | ICD-10-CM

## 2016-12-03 ENCOUNTER — Other Ambulatory Visit: Payer: Self-pay | Admitting: Cardiovascular Disease

## 2016-12-18 ENCOUNTER — Ambulatory Visit
Admission: RE | Admit: 2016-12-18 | Discharge: 2016-12-18 | Disposition: A | Discharge status: Home / Self Care | Payer: Medicare Other | Source: Ambulatory Visit | Attending: Family Medicine | Admitting: Family Medicine

## 2016-12-18 DIAGNOSIS — Z1231 Encounter for screening mammogram for malignant neoplasm of breast: Secondary | ICD-10-CM

## 2016-12-18 DIAGNOSIS — Z9889 Other specified postprocedural states: Secondary | ICD-10-CM

## 2017-03-18 DIAGNOSIS — IMO0002 Reserved for concepts with insufficient information to code with codable children: Secondary | ICD-10-CM

## 2017-03-18 HISTORY — DX: Reserved for concepts with insufficient information to code with codable children: IMO0002

## 2017-04-01 ENCOUNTER — Inpatient Hospital Stay (HOSPITAL_COMMUNITY): Payer: Medicare Other

## 2017-04-01 ENCOUNTER — Inpatient Hospital Stay (HOSPITAL_COMMUNITY): Payer: Medicare Other | Admitting: Certified Registered Nurse Anesthetist

## 2017-04-01 ENCOUNTER — Encounter (HOSPITAL_COMMUNITY): Payer: Self-pay | Admitting: General Practice

## 2017-04-01 ENCOUNTER — Encounter (HOSPITAL_COMMUNITY)
Admission: AD | Disposition: A | Discharge status: Home / Self Care | Payer: Self-pay | Source: Ambulatory Visit | Attending: Orthopedic Surgery

## 2017-04-01 ENCOUNTER — Observation Stay (HOSPITAL_COMMUNITY)
Admission: AD | Admit: 2017-04-01 | Discharge: 2017-04-02 | Disposition: A | Discharge status: Home / Self Care | Payer: Medicare Other | Source: Ambulatory Visit | Attending: Orthopedic Surgery | Admitting: Orthopedic Surgery

## 2017-04-01 ENCOUNTER — Inpatient Hospital Stay: Payer: Self-pay

## 2017-04-01 DIAGNOSIS — K219 Gastro-esophageal reflux disease without esophagitis: Secondary | ICD-10-CM | POA: Insufficient documentation

## 2017-04-01 DIAGNOSIS — E039 Hypothyroidism, unspecified: Secondary | ICD-10-CM | POA: Diagnosis not present

## 2017-04-01 DIAGNOSIS — E785 Hyperlipidemia, unspecified: Secondary | ICD-10-CM | POA: Diagnosis not present

## 2017-04-01 DIAGNOSIS — M5000 Cervical disc disorder with myelopathy, unspecified cervical region: Secondary | ICD-10-CM | POA: Diagnosis present

## 2017-04-01 DIAGNOSIS — M1990 Unspecified osteoarthritis, unspecified site: Secondary | ICD-10-CM | POA: Insufficient documentation

## 2017-04-01 DIAGNOSIS — R2681 Unsteadiness on feet: Secondary | ICD-10-CM | POA: Insufficient documentation

## 2017-04-01 DIAGNOSIS — J45909 Unspecified asthma, uncomplicated: Secondary | ICD-10-CM | POA: Diagnosis not present

## 2017-04-01 DIAGNOSIS — F329 Major depressive disorder, single episode, unspecified: Secondary | ICD-10-CM | POA: Insufficient documentation

## 2017-04-01 DIAGNOSIS — F419 Anxiety disorder, unspecified: Secondary | ICD-10-CM | POA: Diagnosis not present

## 2017-04-01 DIAGNOSIS — Z419 Encounter for procedure for purposes other than remedying health state, unspecified: Secondary | ICD-10-CM

## 2017-04-01 DIAGNOSIS — G4730 Sleep apnea, unspecified: Secondary | ICD-10-CM | POA: Diagnosis not present

## 2017-04-01 DIAGNOSIS — M50022 Cervical disc disorder at C5-C6 level with myelopathy: Secondary | ICD-10-CM

## 2017-04-01 DIAGNOSIS — G9520 Unspecified cord compression: Secondary | ICD-10-CM

## 2017-04-01 HISTORY — DX: Cervical disc disorder at C5-C6 level with myelopathy: M50.022

## 2017-04-01 HISTORY — PX: CERVICAL DISC ARTHROPLASTY: SHX587

## 2017-04-01 HISTORY — DX: Cervical disc disorder with myelopathy, unspecified cervical region: M50.00

## 2017-04-01 HISTORY — DX: Reserved for concepts with insufficient information to code with codable children: IMO0002

## 2017-04-01 LAB — CBC WITH DIFFERENTIAL/PLATELET
BASOS PCT: 1 %
Basophils Absolute: 0 10*3/uL (ref 0.0–0.1)
Eosinophils Absolute: 0.3 10*3/uL (ref 0.0–0.7)
Eosinophils Relative: 4 %
HEMATOCRIT: 37.9 % (ref 36.0–46.0)
HEMOGLOBIN: 12.7 g/dL (ref 12.0–15.0)
Lymphocytes Relative: 29 %
Lymphs Abs: 1.9 10*3/uL (ref 0.7–4.0)
MCH: 29.9 pg (ref 26.0–34.0)
MCHC: 33.5 g/dL (ref 30.0–36.0)
MCV: 89.2 fL (ref 78.0–100.0)
MONO ABS: 0.4 10*3/uL (ref 0.1–1.0)
Monocytes Relative: 7 %
NEUTROS ABS: 4 10*3/uL (ref 1.7–7.7)
NEUTROS PCT: 59 %
Platelets: 167 10*3/uL (ref 150–400)
RBC: 4.25 MIL/uL (ref 3.87–5.11)
RDW: 14 % (ref 11.5–15.5)
WBC: 6.6 10*3/uL (ref 4.0–10.5)

## 2017-04-01 LAB — APTT: APTT: 23 s — AB (ref 24–36)

## 2017-04-01 LAB — SURGICAL PCR SCREEN
MRSA, PCR: NEGATIVE
STAPHYLOCOCCUS AUREUS: NEGATIVE

## 2017-04-01 LAB — URINALYSIS, ROUTINE W REFLEX MICROSCOPIC
Bilirubin Urine: NEGATIVE
GLUCOSE, UA: NEGATIVE mg/dL
HGB URINE DIPSTICK: NEGATIVE
KETONES UR: NEGATIVE mg/dL
LEUKOCYTES UA: NEGATIVE
Nitrite: NEGATIVE
PROTEIN: NEGATIVE mg/dL
Specific Gravity, Urine: 1.008 (ref 1.005–1.030)
pH: 7 (ref 5.0–8.0)

## 2017-04-01 LAB — BASIC METABOLIC PANEL
ANION GAP: 7 (ref 5–15)
BUN: 14 mg/dL (ref 6–20)
CALCIUM: 9.3 mg/dL (ref 8.9–10.3)
CO2: 25 mmol/L (ref 22–32)
Chloride: 108 mmol/L (ref 101–111)
Creatinine, Ser: 0.81 mg/dL (ref 0.44–1.00)
GFR calc non Af Amer: >60 mL/min (ref >60)
Glucose, Bld: 96 mg/dL (ref 65–99)
Potassium: 3.7 mmol/L (ref 3.5–5.1)
Sodium: 140 mmol/L (ref 135–145)

## 2017-04-01 LAB — PROTIME-INR
INR: 0.95
Prothrombin Time: 12.7 seconds (ref 11.4–15.2)

## 2017-04-01 SURGERY — CERVICAL ANTERIOR DISC ARTHROPLASTY
Anesthesia: General | Site: Neck

## 2017-04-01 MED ORDER — BUPIVACAINE HCL (PF) 0.5 % IJ SOLN
INTRAMUSCULAR | Status: DC | PRN
  Administered 2017-04-01: 4 mL

## 2017-04-01 MED ORDER — ROCURONIUM BROMIDE 100 MG/10ML IV SOLN
INTRAVENOUS | Status: DC | PRN
  Administered 2017-04-01: 30 mg via INTRAVENOUS
  Administered 2017-04-01: 20 mg via INTRAVENOUS

## 2017-04-01 MED ORDER — ONDANSETRON HCL 4 MG/2ML IJ SOLN
INTRAMUSCULAR | Status: AC
  Filled 2017-04-01: qty 2

## 2017-04-01 MED ORDER — DOCUSATE SODIUM 100 MG PO CAPS
100.0000 mg | ORAL_CAPSULE | Freq: Two times a day (BID) | ORAL | Status: DC
  Administered 2017-04-02: 100 mg via ORAL
  Filled 2017-04-01: qty 1

## 2017-04-01 MED ORDER — LACTATED RINGERS IV SOLN
INTRAVENOUS | Status: DC | PRN
  Administered 2017-04-01: 21:00:00 via INTRAVENOUS

## 2017-04-01 MED ORDER — SERTRALINE HCL 50 MG PO TABS
100.0000 mg | ORAL_TABLET | Freq: Every day | ORAL | Status: DC
  Filled 2017-04-01: qty 2

## 2017-04-01 MED ORDER — METHOCARBAMOL 500 MG PO TABS: 500.0000 mg | ORAL_TABLET | Freq: Four times a day (QID) | ORAL | Status: DC | PRN

## 2017-04-01 MED ORDER — HYDROCHLOROTHIAZIDE 12.5 MG PO CAPS
12.5000 mg | ORAL_CAPSULE | Freq: Every day | ORAL | Status: DC
  Filled 2017-04-01: qty 1

## 2017-04-01 MED ORDER — FENTANYL CITRATE (PF) 250 MCG/5ML IJ SOLN
INTRAMUSCULAR | Status: AC
  Filled 2017-04-01: qty 5

## 2017-04-01 MED ORDER — MIDAZOLAM HCL 2 MG/2ML IJ SOLN
INTRAMUSCULAR | Status: AC
  Filled 2017-04-01: qty 2

## 2017-04-01 MED ORDER — POLYETHYLENE GLYCOL 3350 17 G PO PACK: 17.0000 g | PACK | Freq: Every day | ORAL | Status: DC | PRN

## 2017-04-01 MED ORDER — FENTANYL CITRATE (PF) 100 MCG/2ML IJ SOLN
INTRAMUSCULAR | Status: DC | PRN
  Administered 2017-04-01: 100 ug via INTRAVENOUS
  Administered 2017-04-01: 50 ug via INTRAVENOUS

## 2017-04-01 MED ORDER — DEXAMETHASONE SODIUM PHOSPHATE 10 MG/ML IJ SOLN
10.0000 mg | Freq: Once | INTRAMUSCULAR | Status: AC
  Administered 2017-04-01: 10 mg via INTRAVENOUS
  Filled 2017-04-01: qty 1

## 2017-04-01 MED ORDER — 0.9 % SODIUM CHLORIDE (POUR BTL) OPTIME
TOPICAL | Status: DC | PRN
  Administered 2017-04-01: 1000 mL

## 2017-04-01 MED ORDER — LIDOCAINE-EPINEPHRINE 1 %-1:100000 IJ SOLN
INTRAMUSCULAR | Status: DC | PRN
  Administered 2017-04-01: 4 mL via INTRADERMAL

## 2017-04-01 MED ORDER — HYDROCODONE-ACETAMINOPHEN 5-325 MG PO TABS: 1.0000 | ORAL_TABLET | Freq: Four times a day (QID) | ORAL | Status: DC | PRN

## 2017-04-01 MED ORDER — LISINOPRIL 20 MG PO TABS
20.0000 mg | ORAL_TABLET | Freq: Every day | ORAL | Status: DC
  Filled 2017-04-01: qty 1

## 2017-04-01 MED ORDER — SUGAMMADEX SODIUM 200 MG/2ML IV SOLN
INTRAVENOUS | Status: DC | PRN
  Administered 2017-04-01: 200 mg via INTRAVENOUS

## 2017-04-01 MED ORDER — FUROSEMIDE 20 MG PO TABS
20.0000 mg | ORAL_TABLET | Freq: Every day | ORAL | Status: DC
  Filled 2017-04-01: qty 1

## 2017-04-01 MED ORDER — EPHEDRINE 5 MG/ML INJ
INTRAVENOUS | Status: AC
  Filled 2017-04-01: qty 10

## 2017-04-01 MED ORDER — LIDOCAINE-EPINEPHRINE 1 %-1:100000 IJ SOLN
INTRAMUSCULAR | Status: AC
  Filled 2017-04-01: qty 1

## 2017-04-01 MED ORDER — HYDROCODONE-ACETAMINOPHEN 5-325 MG PO TABS
1.0000 | ORAL_TABLET | ORAL | Status: DC | PRN
  Administered 2017-04-01: 2 via ORAL
  Filled 2017-04-01: qty 2

## 2017-04-01 MED ORDER — LIDOCAINE HCL (CARDIAC) 20 MG/ML IV SOLN
INTRAVENOUS | Status: DC | PRN
Start: 2017-04-01 — End: 2017-04-02
  Administered 2017-04-01: 5 mL via INTRATRACHEAL

## 2017-04-01 MED ORDER — DEXAMETHASONE SODIUM PHOSPHATE 10 MG/ML IJ SOLN
INTRAMUSCULAR | Status: DC | PRN
  Administered 2017-04-01: 10 mg via INTRAVENOUS

## 2017-04-01 MED ORDER — TRAMADOL HCL 50 MG PO TABS: 50.0000 mg | ORAL_TABLET | Freq: Four times a day (QID) | ORAL | Status: DC | PRN

## 2017-04-01 MED ORDER — SODIUM CHLORIDE 0.9 % IV SOLN: INTRAVENOUS | Status: DC

## 2017-04-01 MED ORDER — DEXAMETHASONE SODIUM PHOSPHATE 10 MG/ML IJ SOLN
INTRAMUSCULAR | Status: AC
  Filled 2017-04-01: qty 1

## 2017-04-01 MED ORDER — SIMVASTATIN 20 MG PO TABS
40.0000 mg | ORAL_TABLET | Freq: Every day | ORAL | Status: DC
  Filled 2017-04-01: qty 2

## 2017-04-01 MED ORDER — SUCCINYLCHOLINE CHLORIDE 20 MG/ML IJ SOLN
INTRAMUSCULAR | Status: DC | PRN
  Administered 2017-04-01: 100 mg via INTRAVENOUS
  Administered 2017-04-01: 80 mg via INTRAVENOUS

## 2017-04-01 MED ORDER — SODIUM CHLORIDE 0.9 % IV SOLN
INTRAVENOUS | Status: DC
  Administered 2017-04-01: 13:00:00 via INTRAVENOUS

## 2017-04-01 MED ORDER — PHENYLEPHRINE HCL 10 MG/ML IJ SOLN
INTRAMUSCULAR | Status: DC | PRN
  Administered 2017-04-01 (×4): 40 ug via INTRAVENOUS

## 2017-04-01 MED ORDER — MORPHINE SULFATE (PF) 4 MG/ML IV SOLN: 0.5000 mg | INTRAVENOUS | Status: DC | PRN

## 2017-04-01 MED ORDER — CHLORHEXIDINE GLUCONATE 4 % EX LIQD
60.0000 mL | Freq: Once | CUTANEOUS | Status: DC
  Filled 2017-04-01: qty 60

## 2017-04-01 MED ORDER — PHENYLEPHRINE 40 MCG/ML (10ML) SYRINGE FOR IV PUSH (FOR BLOOD PRESSURE SUPPORT)
PREFILLED_SYRINGE | INTRAVENOUS | Status: AC
  Filled 2017-04-01: qty 10

## 2017-04-01 MED ORDER — SUGAMMADEX SODIUM 200 MG/2ML IV SOLN
INTRAVENOUS | Status: AC
  Filled 2017-04-01: qty 2

## 2017-04-01 MED ORDER — ASPIRIN EC 81 MG PO TBEC
81.0000 mg | DELAYED_RELEASE_TABLET | Freq: Every day | ORAL | Status: DC
  Filled 2017-04-01: qty 1

## 2017-04-01 MED ORDER — PHENYLEPHRINE 40 MCG/ML (10ML) SYRINGE FOR IV PUSH (FOR BLOOD PRESSURE SUPPORT)
PREFILLED_SYRINGE | INTRAVENOUS | Status: AC
  Filled 2017-04-01: qty 20

## 2017-04-01 MED ORDER — ALBUTEROL SULFATE (2.5 MG/3ML) 0.083% IN NEBU: 2.5000 mg | INHALATION_SOLUTION | Freq: Four times a day (QID) | RESPIRATORY_TRACT | Status: DC | PRN

## 2017-04-01 MED ORDER — ROCURONIUM BROMIDE 10 MG/ML (PF) SYRINGE
PREFILLED_SYRINGE | INTRAVENOUS | Status: AC
  Filled 2017-04-01: qty 10

## 2017-04-01 MED ORDER — THROMBIN 5000 UNITS EX SOLR
CUTANEOUS | Status: AC
  Filled 2017-04-01: qty 5000

## 2017-04-01 MED ORDER — ONDANSETRON HCL 4 MG/2ML IJ SOLN
INTRAMUSCULAR | Status: DC | PRN
  Administered 2017-04-01: 4 mg via INTRAVENOUS

## 2017-04-01 MED ORDER — SODIUM CHLORIDE 0.9 % IR SOLN
Status: DC | PRN
  Administered 2017-04-01: 500 mL

## 2017-04-01 MED ORDER — LEVOTHYROXINE SODIUM 100 MCG PO TABS: 50.0000 ug | ORAL_TABLET | Freq: Every day | ORAL | Status: DC

## 2017-04-01 MED ORDER — SUCCINYLCHOLINE CHLORIDE 200 MG/10ML IV SOSY
PREFILLED_SYRINGE | INTRAVENOUS | Status: AC
  Filled 2017-04-01: qty 20

## 2017-04-01 MED ORDER — MIDAZOLAM HCL 2 MG/2ML IJ SOLN
INTRAMUSCULAR | Status: DC | PRN
  Administered 2017-04-01: 2 mg via INTRAVENOUS

## 2017-04-01 MED ORDER — EPHEDRINE SULFATE 50 MG/ML IJ SOLN
INTRAMUSCULAR | Status: DC | PRN
Start: 2017-04-01 — End: 2017-04-02
  Administered 2017-04-01: 10 mg via INTRAVENOUS
  Administered 2017-04-01: 15 mg via INTRAVENOUS
  Administered 2017-04-01 (×2): 10 mg via INTRAVENOUS

## 2017-04-01 MED ORDER — LISINOPRIL-HYDROCHLOROTHIAZIDE 20-12.5 MG PO TABS
1.0000 | ORAL_TABLET | Freq: Every day | ORAL | Status: DC
Start: 2017-04-01 — End: 2017-04-01

## 2017-04-01 MED ORDER — PROPOFOL 10 MG/ML IV BOLUS
INTRAVENOUS | Status: DC | PRN
  Administered 2017-04-01: 150 mg via INTRAVENOUS

## 2017-04-01 MED ORDER — THROMBIN 5000 UNITS EX SOLR
CUTANEOUS | Status: DC | PRN
  Administered 2017-04-01: 5 mL

## 2017-04-01 MED ORDER — FUROSEMIDE 20 MG PO TABS: 10.0000 mg | ORAL_TABLET | Freq: Every day | ORAL | Status: DC

## 2017-04-01 MED ORDER — CEFAZOLIN SODIUM-DEXTROSE 2-4 GM/100ML-% IV SOLN
2.0000 g | INTRAVENOUS | Status: AC
  Administered 2017-04-01: 2 g via INTRAVENOUS
  Filled 2017-04-01: qty 100

## 2017-04-01 MED ORDER — PROPOFOL 10 MG/ML IV BOLUS
INTRAVENOUS | Status: AC
  Filled 2017-04-01: qty 20

## 2017-04-01 MED ORDER — LIDOCAINE 2% (20 MG/ML) 5 ML SYRINGE
INTRAMUSCULAR | Status: AC
  Filled 2017-04-01: qty 10

## 2017-04-01 MED ORDER — BUPIVACAINE HCL (PF) 0.5 % IJ SOLN
INTRAMUSCULAR | Status: AC
  Filled 2017-04-01: qty 30

## 2017-04-01 MED ORDER — CALCIUM CARBONATE-VITAMIN D 500-200 MG-UNIT PO TABS: 1.0000 | ORAL_TABLET | Freq: Two times a day (BID) | ORAL | Status: DC

## 2017-04-01 SURGICAL SUPPLY — 47 items
ADH SKN CLS APL DERMABOND .7 (GAUZE/BANDAGES/DRESSINGS) ×1
BAG DECANTER FOR FLEXI CONT (MISCELLANEOUS) ×3 IMPLANT
BIT DRILL NEURO 2X3.1 SFT TUCH (MISCELLANEOUS) ×1 IMPLANT
BNDG GAUZE ELAST 4 BULKY (GAUZE/BANDAGES/DRESSINGS) IMPLANT
CANISTER SUCT 3000ML PPV (MISCELLANEOUS) ×3 IMPLANT
DECANTER SPIKE VIAL GLASS SM (MISCELLANEOUS) ×3 IMPLANT
DERMABOND ADVANCED (GAUZE/BANDAGES/DRESSINGS) ×2
DERMABOND ADVANCED .7 DNX12 (GAUZE/BANDAGES/DRESSINGS) ×1 IMPLANT
DISC MOBI-C CERVICAL 13X15 H5 (Miscellaneous) ×2 IMPLANT
DRAPE C-ARM 42X72 X-RAY (DRAPES) ×6 IMPLANT
DRAPE C-ARMOR (DRAPES) ×3 IMPLANT
DRAPE LAPAROTOMY 100X72 PEDS (DRAPES) ×3 IMPLANT
DRAPE MICROSCOPE LEICA (MISCELLANEOUS) IMPLANT
DRAPE POUCH INSTRU U-SHP 10X18 (DRAPES) ×3 IMPLANT
DRILL NEURO 2X3.1 SOFT TOUCH (MISCELLANEOUS) ×3
DURAPREP 6ML APPLICATOR 50/CS (WOUND CARE) ×3 IMPLANT
ELECT REM PT RETURN 9FT ADLT (ELECTROSURGICAL) ×3
ELECTRODE REM PT RTRN 9FT ADLT (ELECTROSURGICAL) ×1 IMPLANT
GAUZE SPONGE 4X4 16PLY XRAY LF (GAUZE/BANDAGES/DRESSINGS) IMPLANT
GLOVE BIO SURGEON STRL SZ 6.5 (GLOVE) ×1 IMPLANT
GLOVE BIO SURGEON STRL SZ7 (GLOVE) ×2 IMPLANT
GLOVE BIO SURGEONS STRL SZ 6.5 (GLOVE) ×1
GLOVE BIOGEL PI IND STRL 8.5 (GLOVE) ×1 IMPLANT
GLOVE BIOGEL PI INDICATOR 8.5 (GLOVE) ×2
GLOVE ECLIPSE 7.0 STRL STRAW (GLOVE) ×2 IMPLANT
GLOVE ECLIPSE 8.5 STRL (GLOVE) ×3 IMPLANT
GOWN STRL REUS W/ TWL LRG LVL3 (GOWN DISPOSABLE) IMPLANT
GOWN STRL REUS W/ TWL XL LVL3 (GOWN DISPOSABLE) ×1 IMPLANT
GOWN STRL REUS W/TWL 2XL LVL3 (GOWN DISPOSABLE) ×3 IMPLANT
GOWN STRL REUS W/TWL LRG LVL3 (GOWN DISPOSABLE)
GOWN STRL REUS W/TWL XL LVL3 (GOWN DISPOSABLE) ×3
HALTER HD/CHIN CERV TRACTION D (MISCELLANEOUS) ×3 IMPLANT
HEMOSTAT POWDER KIT SURGIFOAM (HEMOSTASIS) ×3 IMPLANT
KIT BASIN OR (CUSTOM PROCEDURE TRAY) ×3 IMPLANT
KIT ROOM TURNOVER OR (KITS) ×3 IMPLANT
NDL SPNL 22GX3.5 QUINCKE BK (NEEDLE) ×1 IMPLANT
NEEDLE HYPO 22GX1.5 SAFETY (NEEDLE) ×3 IMPLANT
NEEDLE SPNL 22GX3.5 QUINCKE BK (NEEDLE) ×3 IMPLANT
NS IRRIG 1000ML POUR BTL (IV SOLUTION) ×3 IMPLANT
PACK LAMINECTOMY NEURO (CUSTOM PROCEDURE TRAY) ×3 IMPLANT
PAD ARMBOARD 7.5X6 YLW CONV (MISCELLANEOUS) ×9 IMPLANT
RUBBERBAND STERILE (MISCELLANEOUS) IMPLANT
SPONGE INTESTINAL PEANUT (DISPOSABLE) ×3 IMPLANT
SUT VIC AB 4-0 RB1 18 (SUTURE) ×3 IMPLANT
TOWEL GREEN STERILE (TOWEL DISPOSABLE) ×2 IMPLANT
TOWEL GREEN STERILE FF (TOWEL DISPOSABLE) ×3 IMPLANT
WATER STERILE IRR 1000ML POUR (IV SOLUTION) ×3 IMPLANT

## 2017-04-01 NOTE — Consult Note (Signed)
Reason for Consult: Herniated nucleus pulposus C5-C6 with myelopathy Referring Physician: Dr. Cher [name] Charlotte Charlotte [name] is an 66 y.o. Charlotte [name].  HPI: Patient is a 66 year old Charlotte [name] who has had significant for pathology in the right side. Over the past month she's noted progressive weakness and increasing pain in both her shoulders and her neck numbness and dysesthesias particularly in the right hand but more recently also on the left hand. In last week the patient feels that her gait has deteriorated where she cannot stand straight or stable he. She was seen by Dr. Chaney [name] and an MRI was ordered this revealed the presence of a large extruded fragment of disc at C5-C6 obliterating the canal and causing severe cord compression. She is now taken to the operating room emergently to undergo surgical decompression and arthroplasty at the C5-C6 level.  Past Medical History:  Diagnosis Date  . Anxiety   . Arthritis   . Asthma 01/27/2016  . Depression   . Essential hypertension 01/27/2016  . GERD (gastroesophageal reflux disease)   . HNP (herniated nucleus pulposus) 03/2017  . Hyperlipidemia 01/27/2016  . Hypothyroidism 01/27/2016  . Shortness of breath dyspnea    relating to asthma  . Sleep apnea   . Syncope 01/27/2016    Past Surgical History:  Procedure Laterality Date  . ABDOMINAL HYSTERECTOMY    . BREAST REDUCTION SURGERY  2011  . COLONOSCOPY    . ESOPHAGOGASTRODUODENOSCOPY    . REVERSE SHOULDER ARTHROPLASTY Right 08/02/2016   Procedure: REVERSE SHOULDER ARTHROPLASTY;  Surgeon: Charlotte Cedars, MD;  Location: [facility];  Service: Orthopedics;  Laterality: Right;    Family History  Problem Relation Age of Onset  . Heart failure Mother   . Diabetes Mother   . Hypertension Mother   . Hyperlipidemia Mother   . Lung cancer Father   . Diabetes Father   . Hypertension Father   . Hypertension Sister   . Hyperlipidemia Sister   . Hyperthyroidism Sister   . Lung cancer Brother   . Rheum arthritis  Child   . Lung cancer Child   . Heart disease Maternal Grandfather   . Lung cancer Paternal Grandfather   . Diabetes Brother   . Hyperlipidemia Brother   . Hyperlipidemia Brother     Social History:  reports that she has never smoked. She has never used smokeless tobacco. She reports that she does not drink alcohol or use drugs.  Allergies:  Allergies  Allergen Reactions  . No Known Allergies     Medications: I have reviewed the patient's current medications.  Results for orders placed or performed during the hospital encounter of 04/01/17 (from the past 48 hour(s))  CBC WITH DIFFERENTIAL     Status: None   Collection Time: 04/01/17  2:35 PM  Result Value Ref Range   WBC 6.6 4.0 - 10.5 K/uL   RBC 4.25 3.87 - 5.11 MIL/uL   Hemoglobin 12.7 12.0 - 15.0 g/dL   HCT 37.9 36.0 - 46.0 %   MCV 89.2 78.0 - 100.0 fL   MCH 29.9 26.0 - 34.0 pg   MCHC 33.5 30.0 - 36.0 g/dL   RDW 14.0 11.5 - 15.5 %   Platelets 167 150 - 400 K/uL   Neutrophils Relative % 59 %   Neutro Abs 4.0 1.7 - 7.7 K/uL   Lymphocytes Relative 29 %   Lymphs Abs 1.9 0.7 - 4.0 K/uL   Monocytes Relative 7 %   Monocytes Absolute 0.4 0.1 - 1.0 K/uL   Eosinophils  Relative 4 %   Eosinophils Absolute 0.3 0.0 - 0.7 K/uL   Basophils Relative 1 %   Basophils Absolute 0.0 0.0 - 0.1 K/uL  Basic metabolic panel     Status: None   Collection Time: 04/01/17  2:35 PM  Result Value Ref Range   Sodium 140 135 - 145 mmol/L   Potassium 3.7 3.5 - 5.1 mmol/L   Chloride 108 101 - 111 mmol/L   CO2 25 22 - 32 mmol/L   Glucose, Bld 96 65 - 99 mg/dL   BUN 14 6 - 20 mg/dL   Creatinine, Ser 0.81 0.44 - 1.00 mg/dL   Calcium 9.3 8.9 - 10.3 mg/dL   GFR calc non Af Amer >60 >60 mL/min   GFR calc Af Amer >60 >60 mL/min    Comment: (NOTE) The eGFR has been calculated using the CKD EPI equation. This calculation has not been validated in all clinical situations. eGFR's persistently <60 mL/min signify possible Chronic Kidney Disease.     Anion gap 7 5 - 15  Protime-INR     Status: None   Collection Time: 04/01/17  2:35 PM  Result Value Ref Range   Prothrombin Time 12.7 11.4 - 15.2 seconds   INR 0.95   APTT     Status: Abnormal   Collection Time: 04/01/17  2:35 PM  Result Value Ref Range   aPTT 23 (L) 24 - 36 seconds  Urinalysis, Routine w reflex microscopic     Status: Abnormal   Collection Time: 04/01/17  2:48 PM  Result Value Ref Range   Color, Urine STRAW (A) YELLOW   APPearance CLEAR CLEAR   Specific Gravity, Urine 1.008 1.005 - 1.030   pH 7.0 5.0 - 8.0   Glucose, UA NEGATIVE NEGATIVE mg/dL   Hgb urine dipstick NEGATIVE NEGATIVE   Bilirubin Urine NEGATIVE NEGATIVE   Ketones, ur NEGATIVE NEGATIVE mg/dL   Protein, ur NEGATIVE NEGATIVE mg/dL   Nitrite NEGATIVE NEGATIVE   Leukocytes, UA NEGATIVE NEGATIVE    Dg Cervical Spine Complete  Result Date: 04/01/2017 CLINICAL DATA:  Cord compression. Severe pain in the right shoulder with numbness in the right hand. EXAM: CERVICAL SPINE - COMPLETE 4+ VIEW COMPARISON:  None. FINDINGS: Advanced degenerative disc narrowing at C3-4 and C6-7 with spurring. Mild retrolisthesis at C3-4. Uncovertebral spurring and listhesis causes right foraminal stenosis at C3-4. Mild left foraminal narrowing at C6-7 from uncovertebral ridging. No fracture deformity or endplate erosion. Cervical carotid atherosclerosis. IMPRESSION: 1. Focal advanced disc degeneration at C3-4 and C6-7. Spurring causes associated foraminal stenosis on the right at C3-4 and left at C6-7. 2. History of C5-6 disc herniation. This level is essentially negative by radiography. Electronically Signed   By: Charlotte Charlotte [name] M.D.   On: 04/01/2017 15:46    Review of Systems  HENT: Negative.   Eyes: Negative.   Cardiovascular: Negative.   Musculoskeletal: Positive for neck pain.  Skin: Negative.   Neurological: Positive for sensory change, focal weakness and weakness.  Psychiatric/Behavioral: Negative.    Blood pressure  138/69, pulse 68, temperature 97.8 F (36.6 C), temperature source Oral, resp. rate 18, SpO2 97 %. Physical Exam  Constitutional: She is oriented to person, place, and time. She appears well-developed and well-nourished.  Eyes: Conjunctivae and EOM are normal. Pupils are equal, round, and reactive to light.  Neck: Normal range of motion. Neck supple.  Cardiovascular: Normal rate and regular rhythm.   Respiratory: Effort normal and breath sounds normal.  GI: Soft. Bowel sounds are  normal.  Musculoskeletal: Normal range of motion.  Neurological: She is alert and oriented to person, place, and time.  Weakness of biceps grips 6 in both upper extremities graded 4 minus out of 5. Absent reflexes in the biceps and triceps 3+ reflexes in the patellae and the Achilles. No clonus. Station and gait reveal an abnormally wide-based gait.  Skin: Skin is dry.  Psychiatric: She has a normal mood and affect. Her behavior is normal. Judgment and thought content normal.    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  Decompression and arthroplasty C5-C6.  Charlotte Charlotte [name] 04/01/2017, 4:04 PM

## 2017-04-01 NOTE — Anesthesia Procedure Notes (Signed)
Procedure Name: Intubation Date/Time: 04/01/2017 9:53 PM Performed by: Valetta Fuller Pre-anesthesia Checklist: Patient identified, Emergency Drugs available, Suction available and Patient being monitored Patient Re-evaluated:Patient Re-evaluated prior to inductionOxygen Delivery Method: Circle system utilized Preoxygenation: Pre-oxygenation with 100% oxygen Intubation Type: IV induction Ventilation: Mask ventilation without difficulty Laryngoscope Size: Glidescope Grade View: Grade I Tube size: 7.5 mm Number of attempts: 1 Airway Equipment and Method: Stylet Placement Confirmation: ETT inserted through vocal cords under direct vision,  positive ETCO2 and breath sounds checked- equal and bilateral Secured at: 21 cm Tube secured with: Tape Dental Injury: Teeth and Oropharynx as per pre-operative assessment

## 2017-04-01 NOTE — H&P (Signed)
Charlotte [name] is an 66 y.o. female.   Chief Complaint: severe weakness, loss of balance, and bilateral shoulder and arm pain HPI: 66 year old female who presents with a rapidly progressive loss of lower extremity strength and gait imbalance as well as an increase in bilateral shoulder and arm pain.  Patient is recovering from shoulder surgery 8 months ago on the right and has been in rehabilitation for that.  She has noted increased arm pain and numbness and tingling on the right side for at least the last few months that was attributed to the shoulder replacement.  The pain has become bilateral and has progressed.  The leg symptoms and imbalance and generalized weakness started Saturday and the patient presented in an urgent manner today to my outpatient clinic.  Past Medical History:  Diagnosis Date  . Anxiety   . Arthritis   . Asthma 01/27/2016  . Depression   . Essential hypertension 01/27/2016  . GERD (gastroesophageal reflux disease)   . Hyperlipidemia 01/27/2016  . Hypothyroidism 01/27/2016  . Shortness of breath dyspnea    relating to asthma  . Sleep apnea   . Syncope 01/27/2016    Past Surgical History:  Procedure Laterality Date  . ABDOMINAL HYSTERECTOMY    . BREAST REDUCTION SURGERY  2011  . COLONOSCOPY    . ESOPHAGOGASTRODUODENOSCOPY    . REVERSE SHOULDER ARTHROPLASTY Right 08/02/2016   Procedure: REVERSE SHOULDER ARTHROPLASTY;  Surgeon: Netta Cedars, MD;  Location: [place];  Service: Orthopedics;  Laterality: Right;    Family History  Problem Relation Age of Onset  . Heart failure Mother   . Diabetes Mother   . Hypertension Mother   . Hyperlipidemia Mother   . Lung cancer Father   . Diabetes Father   . Hypertension Father   . Hypertension Sister   . Hyperlipidemia Sister   . Hyperthyroidism Sister   . Lung cancer Brother   . Rheum arthritis Child   . Lung cancer Child   . Heart disease Maternal Grandfather   . Lung cancer Paternal Grandfather   . Diabetes Brother    . Hyperlipidemia Brother   . Hyperlipidemia Brother    Social History:  reports that she has never smoked. She has never used smokeless tobacco. She reports that she does not drink alcohol or use drugs.  Allergies:  Allergies  Allergen Reactions  . No Known Allergies     Medicines: Robaxin, Valium, Tramadol all prn for pain and spasms  No results found for this or any previous visit (from the past 48 hour(s)). No results found.  ROS  No bowel or bladder incontinence or saddle anesthesia by questioning, but urinary urgency noted last few days  Pulse 80 and regular, RR 20  Physical Exam  Patient is seated in a wheel chair in severe distress, neck is tender to palpation and limited mobility due to pain. Right shoulder with limited ROM due to global head and neck stiffness and pain. Incision well healed, no erythema, no drainage  Elbow and wrist PROM with no pain but active use seems less than baseline.  UNable to make a strong grip on the right compared to more normal on the left.  Sensation grossly intact to light touch bilateral UE, pulses normal at the wrists  Bilateral LEs with weak DF, PF and 3 plus reflexes bilateral LE, no clonus  Gait very unsteady and weak with slapping feet  MRI C Spine 04/01/17 - C5-6 HNP with complete canal compromise and cord compression.  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 PLan [facility] urgent admission to [facility].  I have spoken with Dr Kristeen Miss who was in surgery but took my call and has agreed to see her at [facility] once she gets there. Patient has MRI on disk with her.  I appreciate Dr Ellene Route seeing her so quickly and assisting in her care!  She is being transported by family at her request directly to [facility] and is to be NPO.  Augustin Schooling, MD 04/01/2017, 12:18 PM

## 2017-04-01 NOTE — Progress Notes (Signed)
Orthopedics Progress Note  Subjective: Patient reports being much more comfortable since being admitted and laying down and getting pain medications  Objective:  Vitals:   04/01/17 1300 04/01/17 1600  BP: 138/69 126/61  Pulse: 68 62  Resp: 18 20  Temp: 97.8 F (36.6 C) 98.3 F (36.8 C)    General: Awake and alert  Musculoskeletal: Neuro exam stable with 5/5 ankle PF and DF, sensation intact bilateral LEs   Lab Results  Component Value Date   WBC 6.6 04/01/2017   HGB 12.7 04/01/2017   HCT 37.9 04/01/2017   MCV 89.2 04/01/2017   PLT 167 04/01/2017       Component Value Date/Time   NA 140 04/01/2017 1435   K 3.7 04/01/2017 1435   CL 108 04/01/2017 1435   CO2 25 04/01/2017 1435   GLUCOSE 96 04/01/2017 1435   BUN 14 04/01/2017 1435   CREATININE 0.81 04/01/2017 1435   CALCIUM 9.3 04/01/2017 1435   GFRNONAA >60 04/01/2017 1435   GFRAA >60 04/01/2017 1435    Lab Results  Component Value Date   INR 0.95 04/01/2017    Assessment/Plan: Patient admitted for clinical deterioration in functional status and increase in pain due to cervical myelopathy due to large herniated C5-6 disc.  Dr Ellene Route has seen the patient and is planning on surgery tonight for urgent decompression and then disc replacement vs fusion. I appreciate Dr Clarice Pole quick and excellent care.   Doran Heater. Veverly Fells, MD 04/01/2017 7:10 PM

## 2017-04-01 NOTE — Anesthesia Preprocedure Evaluation (Addendum)
Anesthesia Evaluation  Patient identified by MRN, date of birth, ID band Patient awake    Reviewed: Allergy & Precautions, NPO status , Patient's Chart, lab work & pertinent test results  Airway Mallampati: II  TM Distance: >3 FB Neck ROM: Limited    Dental  (+) Teeth Intact, Dental Advisory Given   Pulmonary asthma , sleep apnea and Continuous Positive Airway Pressure Ventilation ,    Pulmonary exam normal breath sounds clear to auscultation       Cardiovascular hypertension, Pt. on medications (-) angina(-) CAD, (-) Past MI and (-) CHF Normal cardiovascular exam Rhythm:Regular Rate:Normal  Stress Test 02/09/16: Nuclear stress EF: 65%. No wall motion abnormalities The left ventricular ejection fraction is normal (55-65%). This is a low risk study. There is no evidence of ischemia on exam Myocardial thickness appears increased, consider left ventricular hypertrophy. Occasional PVCs seen during stress, brief paroxysmal atrial tachycardia, 5 beat run noted as well.   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  Reproductive/Obstetrics                           Anesthesia Physical Anesthesia Plan  ASA: II and emergent  Anesthesia Plan: General   Post-op Pain Management:    Induction: Intravenous  Airway Management Planned: Oral ETT and Video Laryngoscope Planned  Additional Equipment:   Intra-op Plan:   Post-operative Plan: Extubation in OR  Informed Consent: I have reviewed the patients History and Physical, chart, labs  and discussed the procedure including the risks, benefits and alternatives for the proposed anesthesia with the patient or authorized representative who has indicated his/her understanding and acceptance.   Dental advisory given  Plan Discussed with: CRNA  Anesthesia Plan Comments: (Risks/benefits of general anesthesia discussed with patient including risk of damage to teeth, lips, gum, and tongue, nausea/vomiting, allergic reactions to medications, and the possibility of heart attack, stroke and death.  All patient questions answered.  Patient wishes to proceed.)       Anesthesia Quick Evaluation

## 2017-04-02 ENCOUNTER — Encounter (HOSPITAL_COMMUNITY): Payer: Self-pay | Admitting: Neurological Surgery

## 2017-04-02 DIAGNOSIS — M1990 Unspecified osteoarthritis, unspecified site: Secondary | ICD-10-CM | POA: Diagnosis not present

## 2017-04-02 DIAGNOSIS — J45909 Unspecified asthma, uncomplicated: Secondary | ICD-10-CM | POA: Diagnosis not present

## 2017-04-02 DIAGNOSIS — M50022 Cervical disc disorder at C5-C6 level with myelopathy: Secondary | ICD-10-CM | POA: Diagnosis not present

## 2017-04-02 DIAGNOSIS — F419 Anxiety disorder, unspecified: Secondary | ICD-10-CM | POA: Diagnosis not present

## 2017-04-02 MED ORDER — ONDANSETRON HCL 4 MG/2ML IJ SOLN: 4.0000 mg | Freq: Four times a day (QID) | INTRAMUSCULAR | Status: DC | PRN

## 2017-04-02 MED ORDER — HYDROCODONE-ACETAMINOPHEN 5-325 MG PO TABS
1.0000 | ORAL_TABLET | ORAL | Status: DC | PRN
  Administered 2017-04-02 (×3): 2 via ORAL
  Filled 2017-04-02 (×3): qty 2

## 2017-04-02 MED ORDER — HYDROMORPHONE HCL 1 MG/ML IJ SOLN: 0.5000 mg | INTRAMUSCULAR | Status: DC | PRN

## 2017-04-02 MED ORDER — ONDANSETRON HCL 4 MG PO TABS: 4.0000 mg | ORAL_TABLET | Freq: Four times a day (QID) | ORAL | Status: DC | PRN

## 2017-04-02 MED ORDER — BISACODYL 10 MG RE SUPP: 10.0000 mg | Freq: Every day | RECTAL | Status: DC | PRN

## 2017-04-02 MED ORDER — SODIUM CHLORIDE 0.9% FLUSH: 3.0000 mL | Freq: Two times a day (BID) | INTRAVENOUS | Status: DC

## 2017-04-02 MED ORDER — MENTHOL 3 MG MT LOZG: 1.0000 | LOZENGE | OROMUCOSAL | Status: DC | PRN

## 2017-04-02 MED ORDER — FENTANYL CITRATE (PF) 100 MCG/2ML IJ SOLN
25.0000 ug | INTRAMUSCULAR | Status: DC | PRN
  Administered 2017-04-02: 25 ug via INTRAVENOUS

## 2017-04-02 MED ORDER — HYDROCODONE-ACETAMINOPHEN 5-325 MG PO TABS: 1.0000 | ORAL_TABLET | ORAL | 0 refills | Status: DC | PRN

## 2017-04-02 MED ORDER — METHOCARBAMOL 1000 MG/10ML IJ SOLN
500.0000 mg | Freq: Four times a day (QID) | INTRAVENOUS | Status: DC | PRN
  Filled 2017-04-02: qty 5

## 2017-04-02 MED ORDER — PHENOL 1.4 % MT LIQD: 1.0000 | OROMUCOSAL | Status: DC | PRN

## 2017-04-02 MED ORDER — ALUM & MAG HYDROXIDE-SIMETH 200-200-20 MG/5ML PO SUSP: 30.0000 mL | Freq: Four times a day (QID) | ORAL | Status: DC | PRN

## 2017-04-02 MED ORDER — DOCUSATE SODIUM 100 MG PO CAPS: 100.0000 mg | ORAL_CAPSULE | Freq: Two times a day (BID) | ORAL | Status: DC

## 2017-04-02 MED ORDER — METHOCARBAMOL 500 MG PO TABS: 500.0000 mg | ORAL_TABLET | Freq: Four times a day (QID) | ORAL | 1 refills | Status: DC | PRN

## 2017-04-02 MED ORDER — SENNA 8.6 MG PO TABS
1.0000 | ORAL_TABLET | Freq: Two times a day (BID) | ORAL | Status: DC
  Filled 2017-04-02: qty 1

## 2017-04-02 MED ORDER — FLEET ENEMA 7-19 GM/118ML RE ENEM: 1.0000 | ENEMA | Freq: Once | RECTAL | Status: DC | PRN

## 2017-04-02 MED ORDER — SODIUM CHLORIDE 0.9 % IV SOLN
250.0000 mL | INTRAVENOUS | Status: DC
  Administered 2017-04-02: 250 mL via INTRAVENOUS

## 2017-04-02 MED ORDER — FENTANYL CITRATE (PF) 100 MCG/2ML IJ SOLN
INTRAMUSCULAR | Status: AC
  Filled 2017-04-02: qty 2

## 2017-04-02 MED ORDER — POLYETHYLENE GLYCOL 3350 17 G PO PACK: 17.0000 g | PACK | Freq: Every day | ORAL | Status: DC | PRN

## 2017-04-02 MED ORDER — SODIUM CHLORIDE 0.9% FLUSH: 3.0000 mL | INTRAVENOUS | Status: DC | PRN

## 2017-04-02 MED ORDER — METHOCARBAMOL 500 MG PO TABS
500.0000 mg | ORAL_TABLET | Freq: Four times a day (QID) | ORAL | Status: DC | PRN
  Administered 2017-04-02 (×2): 500 mg via ORAL
  Filled 2017-04-02 (×2): qty 1

## 2017-04-02 MED ORDER — ACETAMINOPHEN 650 MG RE SUPP: 650.0000 mg | RECTAL | Status: DC | PRN

## 2017-04-02 MED ORDER — ONDANSETRON HCL 4 MG/2ML IJ SOLN: 4.0000 mg | Freq: Once | INTRAMUSCULAR | Status: DC | PRN

## 2017-04-02 MED ORDER — ACETAMINOPHEN 325 MG PO TABS: 650.0000 mg | ORAL_TABLET | ORAL | Status: DC | PRN

## 2017-04-02 NOTE — Evaluation (Signed)
Physical Therapy Evaluation Patient Details Name: Charlotte [name] MRN: [id number] DOB: 01/26/1951 Today's Date: 04/02/2017   History of Present Illness  Pt is a 66 y/o female s/p surgical decompression and arthroplasty at the C5-C6 level. 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  Clinical Impression  Pt admitted with above diagnosis. Pt currently with functional limitations due to the deficits listed below (see PT Problem List). At the time of PT eval pt was able to perform transfers and ambulation at a min guard to supervision level. Pt reports improvement in symptoms from pre-op but continues to feel weakness in LLE and RUE. Feel pt will benefit from outpatient PT when appropriate per post-op protocol. Pt will benefit from skilled PT to increase their independence and safety with mobility to allow discharge to the venue listed below.       Follow Up Recommendations No PT follow up;Supervision for mobility/OOB    Equipment Recommendations  None recommended by PT    Recommendations for Other Services       Precautions / Restrictions Precautions Precautions: Fall;Cervical Precaution Comments: Reviewed handout in detail with pt and family. Pt was cued for precautions during functional mobility.  Required Braces or Orthoses:  (No Brace Needed Order) Restrictions Weight Bearing Restrictions: No      Mobility  Bed Mobility Overal bed mobility: Needs Assistance Bed Mobility: Rolling;Sidelying to Sit Rolling: Modified independent (Device/Increase time) Sidelying to sit: Min guard       General bed mobility comments: VC's for proper log roll technique. Hands on guarding at shoulders as pt elevated trunk to full sitting position.   Transfers Overall transfer level: Needs  assistance Equipment used: None Transfers: Sit to/from Stand Sit to Stand: Supervision         General transfer comment: Close supervision for safety as pt powered up to full standing position. Pt was able to complete without difficulty.   Ambulation/Gait Ambulation/Gait assistance: Min guard;Supervision Ambulation Distance (Feet): 300 Feet Assistive device: None Gait Pattern/deviations: Step-through pattern;Decreased stride length;Decreased weight shift to left Gait velocity: Decreased Gait velocity interpretation: Below normal speed for age/gender General Gait Details: Min guard progressing to supervision for safety. Pt was able to ambulate well without any noted unsteadiness. Slow but generally steady. Pt holding RUE in a guarded position but was able to relax it to her side for a natural arm swing with cues.   Stairs Stairs: Yes Stairs assistance: Min guard Stair Management: One rail Left;Step to pattern;Forwards Number of Stairs: 5 General stair comments: VC's for sequencing and general safety with stair training. Pt held therapist's hand on R to simulate home environment (B rails) but did not require assist.   Wheelchair Mobility    Modified Rankin (Stroke Patients Only)       Balance Overall balance assessment: Needs assistance Sitting-balance support: Feet supported;No upper extremity supported Sitting balance-Leahy Scale: Good     Standing balance support: No upper extremity supported;During functional activity Standing balance-Leahy Scale: Fair                               Pertinent Vitals/Pain Pain Assessment: Faces Faces Pain Scale: Hurts little more Pain Location: Incision site Pain Descriptors / Indicators: Operative site guarding Pain Intervention(s): Limited activity within patient's tolerance;Monitored during session;Repositioned    Home Living Family/patient expects to be  discharged to:: Private residence Living Arrangements: Other  relatives (Granddaughter) Available Help at Discharge: Family;Available 24 hours/day Type of Home: House Home Access: Stairs to enter Entrance Stairs-Rails: Right;Left;Can reach both Entrance Stairs-Number of Steps: 5 Home Layout: One level Home Equipment: Shower seat;Hand held shower head;Adaptive equipment Additional Comments: Lives with adult granddaughter. When granddaughter is at work the pt will have daughter available to assist.     Prior Function Level of Independence: Independent         Comments: R TSA in september 2017     Hand Dominance   Dominant Hand: Right    Extremity/Trunk Assessment   Upper Extremity Assessment Upper Extremity Assessment: Defer to OT evaluation    Lower Extremity Assessment Lower Extremity Assessment: LLE deficits/detail LLE Deficits / Details: Pt reports decreased strength in LLE consistent with pre-op diagnosis. She also states it is improved from prior to surgery.     Cervical / Trunk Assessment Cervical / Trunk Assessment: Other exceptions Cervical / Trunk Exceptions: s/p surgery  Communication   Communication: No difficulties  Cognition Arousal/Alertness: Awake/alert Behavior During Therapy: WFL for tasks assessed/performed Overall Cognitive Status: Within Functional Limits for tasks assessed                                        General Comments      Exercises     Assessment/Plan    PT Assessment Patient needs continued PT services  PT Problem List Decreased strength;Decreased range of motion;Decreased activity tolerance;Decreased balance;Decreased mobility;Decreased knowledge of use of DME;Decreased safety awareness;Decreased knowledge of precautions;Pain       PT Treatment Interventions DME instruction;Stair training;Gait training;Functional mobility training;Therapeutic activities;Therapeutic exercise;Neuromuscular re-education;Patient/family education    PT Goals (Current goals can be found in  the Care Plan section)  Acute Rehab PT Goals Patient Stated Goal: Home at d/c - back to baseline PT Goal Formulation: With patient/family Time For Goal Achievement: 04/09/17 Potential to Achieve Goals: Good    Frequency Min 5X/week   Barriers to discharge        Co-evaluation               AM-PAC PT "6 Clicks" Daily Activity  Outcome Measure Difficulty turning over in bed (including adjusting bedclothes, sheets and blankets)?: A Little Difficulty moving from lying on back to sitting on the side of the bed? : A Little Difficulty sitting down on and standing up from a chair with arms (e.g., wheelchair, bedside commode, etc,.)?: Total Help needed moving to and from a bed to chair (including a wheelchair)?: A Little Help needed walking in hospital room?: A Little Help needed climbing 3-5 steps with a railing? : A Little 6 Click Score: 16    End of Session Equipment Utilized During Treatment: Gait belt Activity Tolerance: Patient tolerated treatment well Patient left: in bed;with call bell/phone within reach;with family/visitor present (Sitting EOB) Nurse Communication: Mobility status PT Visit Diagnosis: Unsteadiness on feet (R26.81);Pain Pain - part of body:  (Neck)    Time: 6384-6659 PT Time Calculation (min) (ACUTE ONLY): 25 min   Charges:   PT Evaluation $PT Eval Moderate Complexity: 1 Procedure PT Treatments $Gait Training: 8-22 mins   PT G Codes:        Rolinda Roan, PT, DPT Acute Rehabilitation Services Pager: [phone]   Thelma Comp 04/02/2017, 9:29 AM

## 2017-04-02 NOTE — Progress Notes (Signed)
Patient alert and oriented, mae's well, voiding adequate amount of urine, swallowing without difficulty, c/o mild pain. Patient discharged home with family. Script and discharged instructions given to patient. Patient and family stated understanding of d/c instructions given and has an appointment with MD.   

## 2017-04-02 NOTE — Evaluation (Signed)
Occupational Therapy Evaluation and Discharge Patient Details Name: Charlotte [name] MRN: [id number] DOB: [birth year] Today's Date: 04/02/2017    History of Present Illness Pt is a 66 y/o female s/p surgical decompression and arthroplasty at the C5-C6 level. 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   Clinical Impression   PTA Pt independent in ADL/IADL and mobility. Pt currently set up for ADL and supervision for mobility, and is planning on going home with excellent family support. Cervical handout provided and reviewed adls in detail. Pt educated on: compensatory strategies for grooming, set an alarm at night for medication, avoid sitting for long periods of time, correct bed positioning for sleeping, correct sequence for bed mobility, and never wash directly over incision. All education is complete and patient/family indicate understanding.     Follow Up Recommendations  No OT follow up    Equipment Recommendations  None recommended by OT (Pt has appropriate DME)    Recommendations for Other Services       Precautions / Restrictions Precautions Precautions: Fall;Cervical Precaution Comments: Reviewed handout in detail with pt and family. Pt was cued for precautions during functional mobility.  Required Braces or Orthoses:  (No Brace Needed Order) Restrictions Weight Bearing Restrictions: No      Mobility Bed Mobility Overal bed mobility: Needs Assistance Bed Mobility: Rolling;Sidelying to Sit Rolling: Modified independent (Device/Increase time) Sidelying to sit: Min guard       General bed mobility comments: Pt sitting EOB when OT entered the room  Transfers Overall transfer level: Needs assistance Equipment used: None Transfers: Sit to/from Stand Sit to Stand:  Supervision         General transfer comment: supervision for safety    Balance Overall balance assessment: Needs assistance Sitting-balance support: Feet supported;No upper extremity supported Sitting balance-Leahy Scale: Good     Standing balance support: No upper extremity supported;During functional activity Standing balance-Leahy Scale: Fair                             ADL either performed or assessed with clinical judgement   ADL Overall ADL's : Needs assistance/impaired Eating/Feeding: Modified independent;Sitting Eating/Feeding Details (indicate cue type and reason): sitting EOB Grooming: Supervision/safety;Standing Grooming Details (indicate cue type and reason): educated in cup method for oral care, and precautions while showering Upper Body Bathing: Supervision/ safety;Sitting Upper Body Bathing Details (indicate cue type and reason): plans on sitting in walk in shower - discussed non-slip pad for safety Lower Body Bathing: Min guard;With adaptive equipment;Sitting/lateral leans Lower Body Bathing Details (indicate cue type and reason): educated on long handle sponge, and precautions during LB bathing Upper Body Dressing : Set up;Sitting;Cueing for compensatory techniques Upper Body Dressing Details (indicate cue type and reason): Pt educated in sequence for upper body dressing (taking shoulder into consideration) Lower Body Dressing: Set up;Sit to/from stand Lower Body Dressing Details (indicate cue type and reason): able to don underwear without assist Toilet Transfer: Supervision/safety       Tub/ Shower Transfer: Supervision/safety;Walk-in shower;Ambulation;Shower Scientist, research (medical) Details (indicate cue type and reason): discussed non-slip mat for floor of shower Functional mobility during ADLs: Supervision/safety General ADL Comments: Pt has good support from daughter and granddaughter     Vision Patient Visual Report: No change from  baseline Vision Assessment?: No apparent visual deficits     Perception  Praxis      Pertinent Vitals/Pain Pain Assessment: Faces Faces Pain Scale: Hurts little more Pain Location: Incision site Pain Descriptors / Indicators: Operative site guarding Pain Intervention(s): Limited activity within patient's tolerance;Monitored during session;Repositioned     Hand Dominance Right   Extremity/Trunk Assessment Upper Extremity Assessment Upper Extremity Assessment: Overall WFL for tasks assessed (R TSA in Sept - limited ROM)   Lower Extremity Assessment Lower Extremity Assessment: Defer to PT evaluation LLE Deficits / Details: Pt reports decreased strength in LLE consistent with pre-op diagnosis. She also states it is improved from prior to surgery.    Cervical / Trunk Assessment Cervical / Trunk Assessment: Other exceptions Cervical / Trunk Exceptions: s/p surgery   Communication Communication Communication: No difficulties   Cognition Arousal/Alertness: Awake/alert Behavior During Therapy: WFL for tasks assessed/performed Overall Cognitive Status: Within Functional Limits for tasks assessed                                     General Comments  Pt's daughter and granddaughter present for education    Exercises     Shoulder Instructions      Home Living Family/patient expects to be discharged to:: Private residence Living Arrangements: Other relatives (Granddaughter) Available Help at Discharge: Family;Available 24 hours/day Type of Home: House Home Access: Stairs to enter [employer] of Steps: 5 Entrance Stairs-Rails: Right;Left;Can reach both Home Layout: One level     Bathroom Shower/Tub: Occupational psychologist: Handicapped height Bathroom Accessibility: Yes How Accessible: Accessible via walker Home Equipment: Rupert held shower head;Adaptive equipment Adaptive Equipment: Long-handled sponge Additional  Comments: Lives with adult granddaughter. When granddaughter is at work the pt will have daughter available to assist.       Prior Functioning/Environment Level of Independence: Independent        Comments: R TSA in september 2017        OT Problem List: Decreased range of motion;Decreased activity tolerance;Impaired balance (sitting and/or standing);Decreased knowledge of precautions;Decreased knowledge of use of DME or AE;Pain      OT Treatment/Interventions:      OT Goals(Current goals can be found in the care plan section) Acute Rehab OT Goals Patient Stated Goal: Home at d/c - back to baseline OT Goal Formulation: With patient/family Time For Goal Achievement: 04/16/17 Potential to Achieve Goals: Good  OT Frequency:     Barriers to D/C:            Co-evaluation              AM-PAC PT "6 Clicks" Daily Activity     Outcome Measure Help from another person eating meals?: None Help from another person taking care of personal grooming?: None Help from another person toileting, which includes using toliet, bedpan, or urinal?: None Help from another person bathing (including washing, rinsing, drying)?: A Little Help from another person to put on and taking off regular upper body clothing?: A Little Help from another person to put on and taking off regular lower body clothing?: A Little 6 Click Score: 21   End of Session Equipment Utilized During Treatment: Gait belt Nurse Communication: Mobility status;Precautions  Activity Tolerance: Patient tolerated treatment well Patient left: in bed;with call bell/phone within reach;with family/visitor present (sitting EOB for eating breakfast)  OT Visit Diagnosis: Pain;Unsteadiness on feet (R26.81) Pain - Right/Left: Right Pain - part of body: Shoulder (back/neck)  Time: 7473-4037 OT Time Calculation (min): 17 min Charges:  OT General Charges $OT Visit: 1 Procedure OT Evaluation $OT Eval Moderate  Complexity: 1 Procedure G-Codes: OT G-codes **NOT FOR INPATIENT CLASS** Functional Assessment Tool Used: AM-PAC 6 Clicks Daily Activity Functional Limitation: Self care Self Care Current Status (Q9643): At least 20 percent but less than 40 percent impaired, limited or restricted Self Care Goal Status (C3818): At least 1 percent but less than 20 percent impaired, limited or restricted Self Care Discharge Status [phone]): At least 20 percent but less than 40 percent impaired, limited or restricted   Hulda Humphrey OTR/L [place] 04/02/2017, 10:07 AM

## 2017-04-02 NOTE — Anesthesia Postprocedure Evaluation (Signed)
Anesthesia Post Note  Patient: Charlotte [name]  Procedure(s) Performed: Procedure(s) (LRB): CERVICAL ANTERIOR DISC ARTHROPLASTY C5-C6 (N/A)  Patient location during evaluation: PACU Anesthesia Type: General Level of consciousness: awake and alert Pain management: pain level controlled Vital Signs Assessment: post-procedure vital signs reviewed and stable Respiratory status: spontaneous breathing, nonlabored ventilation, respiratory function stable and patient connected to nasal cannula oxygen Cardiovascular status: blood pressure returned to baseline and stable Postop Assessment: no signs of nausea or vomiting Anesthetic complications: no       Last Vitals:  Vitals:   04/02/17 0430 04/02/17 0800  BP: (!) 107/52 119/60  Pulse: 85 89  Resp: 16 18  Temp: 36.4 C 36.9 C    Last Pain:  Vitals:   04/02/17 0800  TempSrc: Oral  PainSc:                  Catalina Gravel

## 2017-04-02 NOTE — Care Management CC44 (Signed)
Condition Code 44 Documentation Completed  Patient Details  Name: Tyshae Stair MRN: [id number] Date of Birth: Oct 28, 1951   Condition Code 44 given:  Yes Patient signature on Condition Code 44 notice:  Yes Documentation of 2 MD's agreement:  Yes Code 44 added to claim:  Yes    Ninfa Meeker, RN 04/02/2017, 10:47 AM

## 2017-04-02 NOTE — Care Management Obs Status (Signed)
[place] NOTIFICATION   Patient Details  Name: Charlotte [name] MRN: [id number] Date of Birth: 1951/08/10   Medicare Observation Status Notification Given:  Yes    Ninfa Meeker, RN 04/02/2017, 10:47 AM

## 2017-04-02 NOTE — Transfer of Care (Signed)
Immediate Anesthesia Transfer of Care Note  Patient: Tyhesha Dutson  Procedure(s) Performed: Procedure(s): CERVICAL ANTERIOR DISC ARTHROPLASTY C5-C6 (N/A)  Patient Location: PACU  Anesthesia Type:General  Level of Consciousness: awake, oriented and sedated  Airway & Oxygen Therapy: Patient connected to nasal cannula oxygen  Post-op Assessment: Report given to RN and Post -op Vital signs reviewed and stable  Post vital signs: Reviewed and stable  Last Vitals:  Vitals:   04/01/17 2355 04/02/17 0000  BP: 120/78 120/78  Pulse: 96 95  Resp: 12 16  Temp: 36.4 C     Last Pain:  Vitals:   04/01/17 1955  TempSrc: Oral  PainSc:       Patients Stated Pain Goal: 4 (36/14/43 1540)  Complications: No apparent anesthesia complications

## 2017-04-02 NOTE — Discharge Summary (Signed)
Physician Discharge Summary  Patient ID: Charlotte [name] MRN: [id number] DOB/AGE: [birth year] 68 y.o.  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 She tolerated surgery well and has had marked improvement in her neural function.  Consults: Neurosurgery  Significant Diagnostic Studies: MRI from [facility] demonstrating large central disc herniation with spinal cord compression C5-C6  Treatments: surgery: Anterior cervical decompression C5-C6 arthroplasty with Mobi-C  Discharge Exam: Blood pressure 119/60, pulse 89, temperature 98.4 F (36.9 C), temperature source Oral, resp. rate 18, height 5\' 7"  (1.702 m), weight 94.3 kg (208 lb), SpO2 98 %. Incision is clean and dry. Station and gait are markedly improved patient walking with less wide-based to the gait upper extremity strength is now 4+ out of 5 in all groups.  Disposition: 01-Home or Self Care  Discharge Instructions    Call MD for:  redness, tenderness, or signs of infection (pain, swelling, redness, odor or green/yellow discharge around incision site)    Complete by:  As directed    Call MD for:  severe uncontrolled pain    Complete by:  As directed    Call MD for:  temperature >100.4    Complete by:  As directed    Diet - low sodium heart healthy    Complete by:  As directed    Discharge instructions    Complete by:  As directed    Okay to shower. Do not apply salves or appointments to incision. No heavy lifting with the upper  extremities greater than 15 pounds. May resume driving when not requiring pain medication and patient feels comfortable with doing so.   Incentive spirometry RT    Complete by:  As directed    Increase activity slowly    Complete by:  As directed         Signed: Dakarri Kessinger J 04/02/2017, 9:17 AM

## 2017-04-02 NOTE — Op Note (Signed)
Surgery: 04/01/2017 Preoperative diagnosis: Herniated nucleus pulposus C5-C6 with myelopathy Postoperative diagnosis: Same Procedure: Anterior cervical decompression C5-C6 arthroplasty using Mobi-C prosthesis measuring 15 x 13 x 5 mm. Surgeon: Kristeen Miss Anesthesia: Gen. endotracheal Indications: Charlotte [name] is a 66 year old individual who a number of months ago had shoulder arthroplasty. She did well after shoulder surgery but had significant pain in her neck and shoulders a persistent and this has gotten worse in the last weeks time she was noted to have developed some weakness in her arms and also difficulty with her station and gait she is unsteady on her feet she is found to have a large extruded fragment of disc at C5-C6 compressing the spinal cord she is advised regarding the need for surgery.  Procedure: The patient was brought to the operating room supine on a stretcher. After the smooth induction of general endotracheal anesthesia she was placed on the operating table in supine position. The neck was prepped with alcohol DuraPrep and draped in a sterile fashion. Fluoroscopic guidance was used to localize C5-6. Transverse incision was made in the neck and carried down to the platysma plane between the sternal bladder mastoid and the strap muscles dissected bluntly until the prevertebral space was reached this was again identified positively with fluoroscopy as 56 area discectomy was then undertaken opening the anterior longitudinal ligament using a series of curettes and rongeurs to evacuate the total of the disc in the interspace. As region of the posterior longitudinal ligament was reached is noted be an opening centrally through this opening a 1 mm Kerrison punch could be admitted and the opening was enlarged underneath this was found be several large fragments of disc material that had herniated into the free epidural space these were carefully retrieved using a combination of Kerrison  punches and micropituitary rongeurs in addition to a nerve hook to feel at the borders to make sure all the disc material was removed once all this was removed the lateral recesses could be more easily explored no further disc material was identified here. With the decompression being obtained the endplates were smoothed the appropriate contour and the interspace was sized for appropriate size arthroplasty is felt by 15 x 13 mm 5 mm tall MOBI see arthroplasty would fit best and this was placed into the interspace under fluoroscopic guidance. Once set in position the device was released and final radiographs were obtained hemostasis was obtained in the wound and then the platysma was closed with 3-0 Vicryl interrupted fashion and 3-0 Vicryl was used in a subcuticular skin. Patient tolerated procedure well. Blood loss was minimal area

## 2017-04-05 NOTE — Progress Notes (Signed)
Physical Therapy Addendum for G-Codes    2017/05/01 0912  PT G-Codes **NOT FOR INPATIENT CLASS**  Functional Assessment Tool Used Clinical judgement  Functional Limitation Mobility: Walking and moving around  Mobility: Walking and Moving Around Current Status (D5258) CJ  Mobility: Walking and Moving Around Goal Status (F4834) CI    Rolinda Roan, PT, DPT Acute Rehabilitation Services Pager: [phone]

## 2017-08-20 ENCOUNTER — Other Ambulatory Visit: Payer: Self-pay | Admitting: Cardiovascular Disease

## 2017-10-14 ENCOUNTER — Encounter: Payer: Self-pay | Admitting: Psychology

## 2017-11-11 ENCOUNTER — Other Ambulatory Visit: Payer: Self-pay | Admitting: Cardiovascular Disease

## 2017-11-12 NOTE — Telephone Encounter (Signed)
REFILL 

## 2017-11-17 ENCOUNTER — Other Ambulatory Visit: Payer: Self-pay | Admitting: Family Medicine

## 2017-11-17 DIAGNOSIS — Z1231 Encounter for screening mammogram for malignant neoplasm of breast: Secondary | ICD-10-CM

## 2017-12-19 ENCOUNTER — Ambulatory Visit
Admission: RE | Admit: 2017-12-19 | Discharge: 2017-12-19 | Disposition: A | Discharge status: Home / Self Care | Payer: Medicare Other | Source: Ambulatory Visit | Attending: Family Medicine | Admitting: Family Medicine

## 2017-12-19 DIAGNOSIS — Z1231 Encounter for screening mammogram for malignant neoplasm of breast: Secondary | ICD-10-CM

## 2018-01-01 ENCOUNTER — Ambulatory Visit: Payer: Medicare Other | Admitting: Psychology

## 2018-01-01 ENCOUNTER — Encounter: Payer: Self-pay | Admitting: Psychology

## 2018-01-01 DIAGNOSIS — R413 Other amnesia: Secondary | ICD-10-CM

## 2018-01-01 NOTE — Progress Notes (Signed)
NEUROBEHAVIORAL STATUS EXAM   Name: Charlotte [name] Date of Birth: [birth year] Date of Interview: 01/01/2018  Reason for Referral:  Charlotte [name] is a 67 y.o. female who is referred for neuropsychological evaluation by Dr. [name] of [facility] due to concerns about memory decline. This patient is accompanied in the office by her daughter who supplements the history.  History of Presenting Problem:  Charlotte [name] and her daughter report gradual onset of memory decline approximately 1 - 1.5 years ago, more noticeable in the past 6 mos to a year. She and her children have voiced concerns about memory decline in the past with Dr. Brigitte Pulse. In 2017, Topamax was discontinued and sertraline was decreased, and that seemed to help improve memory. However, they are noticing decline again over the past year. Particularly concerning to the patient is that she is forgetting to do things she intends to do. For example, she forgot to go for Jury Duty twice in the fall of 2018 even after being reminded on the same day. She and her daughter also endorse the following cognitive symptoms: forgetting details of recent conversations, forgetting recent events, repeating questions and statements, misplacing and losing things, word finding difficulty and occasional difficulty following conversation or comprehending what is being told to her.  She denies significant difficulty with attention/concentration. She denies difficulty with visual-spatial navigation.   The patient lives alone and manages all instrumental ADLs independently. She has not gotten lost driving or had difficulty navigating to familiar locations. She manages her medications and fills her pill planner weekly with no difficulty. She manages her finances and has forgotten to pay a bill on occasion. She has missed appointments, as discussed earlier. She does not have any trouble with cooking or meal preparation.  The patient endorses depressed mood with  anhedonia and reduced interest/motivation. She also endorses generalized anxiety with frequent worrying and inability to control worrying. She has never seen a mental health professional for assessment or treatment. She has been taking Sertraline as prescribed by her PCP. She denies any previous trials of any other psychotropic medications. She was taking 200 mg of sertraline daily but this was reduced in November to 100 mg to see if this would have any impact on memory. She and her daughter have not seen any change in memory or mood since making that dosage change. She denies suicidal ideation or intention.  Physically, she complains of significant daily fatigue which has been going on a long time. She does not feel she sleeps well. She has sleep apnea and uses a CPAP. She has difficulty falling asleep. She does not regularly take naps during the day but can on occasion.   Her family has told her that her gait has changed in that she is not picking up her feet enough. She fell going up the steps on one occasion (no injury).   Family history is significant for dementia in her maternal grandmother and one maternal aunt.   Social History: Born/Raised: [place] Education: 9th grade Occupational history: Worked in [employer]. Retired since 2013. Marital history: Widowed since 2010. 3 daughters and 5 grandchildren. Alcohol: None Tobacco: None, never a smoker   Medical History: Past Medical History:  Diagnosis Date  . Anxiety   . Arthritis   . Asthma 01/27/2016  . Depression   . Essential hypertension 01/27/2016  . GERD (gastroesophageal reflux disease)   . HNP (herniated nucleus pulposus) 03/2017  . Hyperlipidemia 01/27/2016  . Hypothyroidism 01/27/2016  .  Shortness of breath dyspnea    relating to asthma  . Sleep apnea   . Syncope 01/27/2016    Current Medications:  Outpatient Encounter Medications as of 01/01/2018  Medication Sig  . albuterol (PROVENTIL HFA;VENTOLIN HFA) 108  (90 Base) MCG/ACT inhaler Inhale 2 puffs into the lungs every 6 (six) hours as needed for wheezing or shortness of breath.   Marland Kitchen aspirin 81 MG tablet Take 81 mg by mouth daily.  . furosemide (LASIX) 20 MG tablet TAKE 1 TABLET BY MOUTH  DAILY  . Glycopyrrolate-Formoterol (BEVESPI AEROSPHERE) 9-4.8 MCG/ACT AERO Inhale 2 puffs into the lungs daily.   Marland Kitchen HYDROcodone-acetaminophen (NORCO/VICODIN) 5-325 MG tablet Take 1-2 tablets by mouth every 4 (four) hours as needed (breakthrough pain).  Marland Kitchen levothyroxine (SYNTHROID, LEVOTHROID) 50 MCG tablet Take 50 mcg by mouth daily.   Marland Kitchen lisinopril-hydrochlorothiazide (PRINZIDE,ZESTORETIC) 20-12.5 MG tablet TAKE 1 TABLET BY MOUTH  DAILY  . methocarbamol (ROBAXIN) 500 MG tablet Take 1 tablet (500 mg total) by mouth 3 (three) times daily as needed. (Patient taking differently: Take 500 mg by mouth 3 (three) times daily as needed for muscle spasms. )  . methocarbamol (ROBAXIN) 500 MG tablet Take 1 tablet (500 mg total) by mouth every 6 (six) hours as needed for muscle spasms.  Marland Kitchen sertraline (ZOLOFT) 100 MG tablet Take 100 mg by mouth 2 (two) times daily.   . simvastatin (ZOCOR) 40 MG tablet Take 40 mg by mouth at bedtime.    No facility-administered encounter medications on file as of 01/01/2018.    Patient is not taking hydrocodone. Patient is taking 100 mg of sertraline daily (reduced in 09/2017 from 200 mg) Per Dr. Raul Del medication list, the patient is taking lisinopril-HCTZ 10-12.5 mg tablet once daily (not 20-12.5) Per Dr. Raul Del medication list, the patient is prescribed meclizine HCl 25 mg tablet 1-2 orally every 6 hours as needed  Behavioral Observations:   Appearance: Neatly, casually and appropriately dressed and groomed Gait: Ambulated independently, no gross abnormalities observed Speech: Fluent; normal rate, rhythm and volume. No significant word finding difficulty. Thought process: Linear, goal directed Affect: Full, mildly dysthymic Interpersonal:  Pleasant, appropriate   30 minutes spent face-to-face with patient completing neurobehavioral status exam. 20 minutes spent integrating medical records/clinical data and completing this report. CPT code [phone].   TESTING: There is medical necessity to proceed with neuropsychological assessment as the results will be used to aid in differential diagnosis and clinical decision-making and to inform specific treatment recommendations. Per the patient, her daughter and medical records reviewed, there has been a change in cognitive functioning and a reasonable suspicion of neurocognitive disorder (rule out underlying neurodegenerative dementia, cognitive effects of sleep apnea/poor sleep, pseudodementia/depression).  Clinical Decision Making: In considering the patient's current level of functioning, level of presumed impairment, nature of symptoms, emotional and behavioral responses during the interview, level of literacy, and observed level of motivation, a battery of tests was selected and communicated to the psychometrician.   Following the clinical interview/neurobehavioral status exam, the patient completed this full battery of neuropsychological testing with my psychometrician under my supervision (see separate note).   PLAN: The patient will return to see me for a follow-up session at which time her test performances and my impressions and treatment recommendations will be reviewed in detail.  Evaluation ongoing; full report to follow.

## 2018-01-01 NOTE — Progress Notes (Signed)
   Neuropsychology Note  Charlotte [name] completed 60 minutes of neuropsychological testing with technician, Milana Kidney, BS, under the supervision of Dr. Macarthur Critchley, Licensed Psychologist. The patient did not appear overtly distressed by the testing session, per behavioral observation or via self-report to the technician. Rest breaks were offered.   Clinical Decision Making: In considering the patient's current level of functioning, level of presumed impairment, nature of symptoms, emotional and behavioral responses during the interview, level of literacy, and observed level of motivation/effort, a battery of tests was selected and communicated to the psychometrician.  Communication between the psychologist and technician was ongoing throughout the testing session and changes were made as deemed necessary based on patient performance on testing, technician observations and additional pertinent factors such as those listed above.  Charlotte [name] will return within approximately 2 weeks for an interactive feedback session with [name] at which time her test performances, clinical impressions and treatment recommendations will be reviewed in detail. The patient understands she can contact our office should she require our assistance before this time.  15 minutes spent performing neuropsychological evaluation services/clinical decision making (psychologist). [CPT 06004] 60 minutes spent face-to-face with patient administering standardized tests, 30 minutes spent scoring (technician). [CPT [id number], 59977]  Full report to follow.

## 2018-02-16 ENCOUNTER — Other Ambulatory Visit: Payer: Self-pay | Admitting: Family Medicine

## 2018-02-16 DIAGNOSIS — M25512 Pain in left shoulder: Secondary | ICD-10-CM

## 2018-02-18 ENCOUNTER — Ambulatory Visit
Admission: RE | Admit: 2018-02-18 | Discharge: 2018-02-18 | Disposition: A | Discharge status: Home / Self Care | Payer: Medicare Other | Source: Ambulatory Visit | Attending: Family Medicine | Admitting: Family Medicine

## 2018-02-18 DIAGNOSIS — M25512 Pain in left shoulder: Secondary | ICD-10-CM

## 2018-02-26 ENCOUNTER — Other Ambulatory Visit: Payer: Self-pay | Admitting: Cardiovascular Disease

## 2018-02-26 NOTE — Telephone Encounter (Signed)
REFILL 

## 2018-03-02 NOTE — Progress Notes (Signed)
NEUROPSYCHOLOGICAL EVALUATION   Name:    Charlotte [name]  Date of Birth:   03/10/1951 Date of Interview:  01/01/2018 Date of Testing:  01/01/2018   Date of Feedback:  03/05/2018       Background Information:  Reason for Referral:  Ashlley [name] is a 67 y.o. female referred by Dr. [name] of [facility] to assess her current level of cognitive functioning and assist in differential diagnosis. The current evaluation consisted of a review of available medical records, an interview with the patient and her daughter, and the completion of a neuropsychological testing battery. Informed consent was obtained.  History of Presenting Problem:  Charlotte [name] and her daughter report gradual onset of memory decline approximately 1 - 1 1/2 years ago, more noticeable in the past 6 mos to a year. She and her children have voiced concerns about memory decline in the past with Dr. Brigitte Pulse. In 2017, Topamax was discontinued and sertraline was decreased, and that seemed to help improve memory. However, they are noticing decline again over the past year. Particularly concerning to the patient is that she is forgetting to do things she intends to do. For example, she forgot to go for Jury Duty twice in the fall of 2018 even after being reminded on the same day. She and her daughter also endorse the following cognitive symptoms: forgetting details of recent conversations, forgetting recent events, repeating questions and statements, misplacing and losing things, word finding difficulty and occasional difficulty following conversation or comprehending what is being told to her.  She denies significant difficulty with attention/concentration. She denies difficulty with visual-spatial navigation.   The patient lives alone and manages all instrumental ADLs independently. She has not gotten lost driving or had difficulty navigating to familiar locations. She manages her medications and fills her pill planner weekly  with no difficulty. She manages her finances and has forgotten to pay a bill on occasion. She has missed appointments, as discussed earlier. She does not have any trouble with cooking or meal preparation.  The patient endorses depressed mood with anhedonia and reduced interest/motivation. She also endorses generalized anxiety with frequent worrying and inability to control worrying. She has never seen a mental health professional for assessment or treatment. She has been taking Sertraline as prescribed by her PCP. She denies any previous trials of any other psychotropic medications. She was taking 200 mg of sertraline daily but this was reduced in November to 100 mg to see if this would have any impact on memory. She and her daughter have not seen any change in memory or mood since making that dosage change. She denies suicidal ideation or intention.  Physically, she complains of significant daily fatigue which has been going on a long time. She does not feel she sleeps well. She has sleep apnea and uses a CPAP. She has difficulty falling asleep. She does not regularly take naps during the day but may on occasion.   Her family has told her that her gait has changed in that she is not picking up her feet enough. She fell going up the steps on one occasion (no injury).   Family history is significant for dementia in her maternal grandmother and one maternal aunt.   Social History: Born/Raised: [place] Education: 9th grade Occupational history: Worked in [employer]. Retired since 2013. Marital history: Widowed since 2010. 3 daughters and 5 grandchildren. Alcohol: None Tobacco: None, never a smoker   Medical History:  Past Medical History:  Diagnosis Date  . Anxiety   . Arthritis   . Asthma 01/27/2016  . Depression   . Essential hypertension 01/27/2016  . GERD (gastroesophageal reflux disease)   . HNP (herniated nucleus pulposus) 03/2017  . Hyperlipidemia 01/27/2016  .  Hypothyroidism 01/27/2016  . Shortness of breath dyspnea    relating to asthma  . Sleep apnea   . Syncope 01/27/2016    Current medications:  Outpatient Encounter Medications as of 03/05/2018  Medication Sig  . albuterol (PROVENTIL HFA;VENTOLIN HFA) 108 (90 Base) MCG/ACT inhaler Inhale 2 puffs into the lungs every 6 (six) hours as needed for wheezing or shortness of breath.   Marland Kitchen aspirin 81 MG tablet Take 81 mg by mouth daily.  . furosemide (LASIX) 20 MG tablet Take 1 tablet (20 mg total) by mouth daily. NEED OV.  Marland Kitchen Glycopyrrolate-Formoterol (BEVESPI AEROSPHERE) 9-4.8 MCG/ACT AERO Inhale 2 puffs into the lungs daily.   Marland Kitchen HYDROcodone-acetaminophen (NORCO/VICODIN) 5-325 MG tablet Take 1-2 tablets by mouth every 4 (four) hours as needed (breakthrough pain). (Patient not taking: Reported on 01/01/2018)  . levothyroxine (SYNTHROID, LEVOTHROID) 50 MCG tablet Take 50 mcg by mouth daily.   Marland Kitchen lisinopril-hydrochlorothiazide (PRINZIDE,ZESTORETIC) 20-12.5 MG tablet Take 1 tablet by mouth daily. NEED OV.  . LORazepam (ATIVAN) 1 MG tablet   . methocarbamol (ROBAXIN) 500 MG tablet Take 1 tablet (500 mg total) by mouth 3 (three) times daily as needed. (Patient taking differently: Take 500 mg by mouth 3 (three) times daily as needed for muscle spasms. )  . methocarbamol (ROBAXIN) 500 MG tablet Take 1 tablet (500 mg total) by mouth every 6 (six) hours as needed for muscle spasms.  Marland Kitchen sertraline (ZOLOFT) 100 MG tablet Take 100 mg by mouth once.   . simvastatin (ZOCOR) 40 MG tablet Take 40 mg by mouth at bedtime.    No facility-administered encounter medications on file as of 03/05/2018.    Patient is not taking hydrocodone. Patient is taking 100 mg of sertraline daily (reduced in 09/2017 from 200 mg) Per Dr. Raul Del medication list, the patient is taking lisinopril-HCTZ 10-12.5 mg tablet once daily (not 20-12.5) Per Dr. Raul Del medication list, the patient is prescribed meclizine HCl 25 mg tablet 1-2 orally every 6  hours as needed   Current Examination:  Behavioral Observations:  Appearance: Neatly, casually and appropriately dressed and groomed Gait: Ambulated independently, no gross abnormalities observed Speech: Fluent; normal rate, rhythm and volume. No significant word finding difficulty. Thought process: Linear, goal directed Affect: Full, mildly dysthymic Interpersonal: Pleasant, appropriate Orientation: Oriented to all spheres. Accurately named the current President and his predecessor.   Tests Administered: . Test of Premorbid Functioning (TOPF) . Wechsler Adult Intelligence Scale-Fourth Edition (WAIS-IV): Similarities, Music therapist, Coding and Digit Span subtests . Wechsler Memory Scale-Fourth Edition (WMS-IV) Older Adult Version (ages 58-90): Logical Memory I, II and Recognition subtests  . [employer] Verbal Learning Test - 2nd Edition (CVLT-2) Short Form . Repeatable Battery for the Assessment of Neuropsychological Status (RBANS) Form A:  Figure Copy and Recall subtests and Semantic Fluency subtest . Boston Naming Test (BNT) . Boston Diagnostic Aphasia Examination: Complex Ideational Material subtest . Controlled Oral Word Association Test (COWAT) . Trail Making Test A and B . Clock drawing test . Beck Depression Inventory - 2nd Edition (BDI-II) . Generalized Anxiety Disorder - 7 item screener (GAD-7)  Test Results: Note: Standardized scores are presented only for use by appropriately trained professionals and to allow for any future test-retest comparison. These scores should not be  interpreted without consideration of all the information that is contained in the rest of the report. The most recent standardization samples from the test publisher or other sources were used whenever possible to derive standard scores; scores were corrected for age, gender, ethnicity and education when available.   Test Scores:  Test Name Raw Score Standardized Score Descriptor  TOPF 14/70 SS= 74  Borderline  WAIS-IV Subtests     Similarities 15/36 ss= 6 Low average  Block Design 24/66 ss= 8 Low end of average  Coding 36/135 ss= 7 Low average  Digit Span Forward 6/16 ss= 5 Borderline  Digit Span Backward 5/16 ss= 6 Low average  WMS-IV Subtests     LM I 11/53 ss= 3 Impaired  LM II 0/39 ss= 1 Severely impaired  LM II Recognition 8/23 Cum %: <2 Severely impaired  RBANS Subtests     Figure Copy 19/20 Z= 0.5 Average  Figure Recall 11/20 Z= -0.7 Average  Semantic Fluency 14 Z= -1.5 Borderline  CVLT-II Scores     Trial 1 4/9 Z= -1 Low average  Trial 4 6/9 Z= -1.5 Borderline  Trials 1-4 total 19/36 T= 32 Borderline  SD Free Recall 5/9 Z= -1.5 Borderline  LD Free Recall 4/9 Z= -1.5 Borderline  LD Cued Recall 5/9 Z= -1.5 Borderline  Recognition Discriminability 8/9 hits, 3 false positives Z= -1 Low average  Forced Choice Recognition 9/9  WNL  BNT 48/60 T= 44 Average  BDAE Complex Ideational Material 7/12 T=13 Severely impaired  COWAT-FAS 14 T= 26 Severely impaired  COWAT-Animals 13 T= 37 Low average  Trail Making Test A  54" 0 errors T= 38 Low average  Trail Making Test B  95" 0 errors T= 51 Average  Clock Drawing   WNL  BDI-II 15/63  Mild  GAD-7 13/21  Moderate      Description of Test Results:  Premorbid verbal intellectual abilities were estimated to have been within the borderline range based on a test of word reading. Psychomotor processing speed was low average. Auditory attention and working memory were borderline to low average. Visual-spatial construction was average. Language abilities were variable. Specifically, confrontation naming was average (when compared to age- and education- matched normative sample), while phonemic verbal fluency was severely impaired, and semantic verbal fluency ranged from low average to borderline. Auditory comprehension of complex ideational material was severely impaired. With regard to verbal memory, encoding and acquisition of  non-contextual information (i.e., word list) was borderline. After a brief distracter task, free recall was borderline (5/9 items). After a delay, free recall was borderline (4/9 items). Cued recall was borderline (5/9 items). Performance on a yes/no recognition task was low average. On another verbal memory test, encoding and acquisition of contextual auditory information (i.e., short stories) was impaired. After a delay, free recall was severely impaired (no recall of any of the aspects of either story). Performance on a yes/no recognition task was severely impaired and below chance. Meanwhile, with regard to non-verbal memory, delayed free recall of visual information was average. Performance on tasks measuring various aspects of executive functioning were within normal limits. Mental flexibility and set-shifting were average on Trails B. Verbal abstract reasoning was low average. Performance on a clock drawing task was normal. On a self-report measure of mood, the patient's responses were indicative of clinically significant depression at the present time. Symptoms endorsed included: sadness much of the time, anhedonia, guilty feelings, tearfulness, indecisiveness, reduced energy, reduced sleep, irritability, increased appetite, concentration difficulty, fatigue and reduced  libido. She denied suicidal ideation or intention. On a self-report measure of anxiety, the patient endorsed moderate and clinically significant generalized anxiety, characterized by nervousness, excessive worries, and irritability nearly every day, as well as restlessness, inability to control worrying, and trouble relaxing several days of the week.    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 Generalized anxiety disorder, moderate. Results of cognitive testing are abnormal, and even when interpreted in the context of low level of education there are areas of  significant impairment. Additionally, there is evidence that her cognitive deficits are beginning to interfere with her ability to manage complex tasks (e.g., manage appointments such as her recent jury duty). As such, diagnostic criteria for a dementia syndrome are met. 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 The patient's cognitive profile indicates severe impairments in verbal memory. Additionally, auditory comprehension of complex ideational material, and certain aspects of verbal fluency, are severely impaired. Other aspects of cognitive functioning, including processing speed, attention, visual-spatial construction, and executive functioning, were all within expected ranges for her age and estimated premorbid baseline. Given her clinical features and cognitive profile, early stage Alzheimer's disease is considered the most likely etiology. However, neuroimaging to rule out structural abnormality and to assess vascular load (if brain MRI or CT has not already been performed) will assist with differential diagnosis of etiology.  The patient also presents with mild depression and more significant generalized anxiety, however I do not think this alone would cause the level of impairment and the specific cognitive profile that we are seeing in this patient. These issues may exacerbate underlying cognitive dysfunction but I doubt they are the sole cause.    Recommendations/Plan: Based on the findings of the present evaluation, the following recommendations are offered:  1. Brain MRI is recommended as discussed above. 2. Treatment for depression/anxiety: May want to consider psychiatry consultation to evaluate effectiveness of current medication. I also think she could benefit from supportive counseling (being provided a summary of each session will help with carryover, given memory difficulties). 3. Continue regular CPAP use, and consider updated sleep study (if one hasn't been performed in the  past year) given the patient's complaint of poor sleep. Untreated sleep apnea can exacerbate underlying dementia. 4. She appears to be an appropriate candidate for cholinesterase inhibitor therapy; she can speak with her PCP or see one of our neurologists if she is interested in discussing this further. 5. At this time, I recommend limiting driving to familiar local areas, avoiding high traffic areas if possible, given her memory difficulty. However it is reassuring that she performed quite well on a cognitive test highly correlated with driving ability (Trails B). 6. She should utilize a daily/weekly pill planner for medications and have a family member monitor these to ensure no errors are made. 7. She should utilize one calendar and have alarm reminders if necessary for appointments. I recommend that a family member be aware of her appointments as well and accompany her to important ones. 8. Financial POA and healthcare POA are recommended to be put in place if they are not already. 9. Neuropsychological re-evaluation in 12 months will assist in monitoring cognitive status, tracking progression of symptoms and further contributing to treatment planning. 10. I provided written information to the patient and her daughter regarding coping with a dementia diagnosis and community resources.   Feedback to Patient: Kaylean Tupou and her daughter returned for a feedback appointment on 03/05/2018 to review  the results of her neuropsychological evaluation with this provider. 30 minutes face-to-face time was spent reviewing her test results, my impressions and my recommendations as detailed above.    Total time spent on this patient's case: 50 minutes for neurobehavioral status exam with psychologist (CPT code [phone]); 90 minutes of testing/scoring by psychometrician under psychologist's supervision (CPT codes [phone], [phone] units); 180 minutes for integration of patient data, interpretation of standardized  test results and clinical data, clinical decision making, treatment planning and preparation of this report, and interactive feedback with review of results to the patient/family by psychologist (CPT codes [phone], [phone] units).      Thank you for your referral of Seline Enzor. Please feel free to contact me if you have any questions or concerns regarding this report.

## 2018-03-05 ENCOUNTER — Encounter: Payer: Self-pay | Admitting: Psychology

## 2018-03-05 ENCOUNTER — Ambulatory Visit: Payer: Medicare Other | Admitting: Psychology

## 2018-03-05 DIAGNOSIS — F03A0 Unspecified dementia, mild, without behavioral disturbance, psychotic disturbance, mood disturbance, and anxiety: Secondary | ICD-10-CM

## 2018-03-05 DIAGNOSIS — F0390 Unspecified dementia without behavioral disturbance: Secondary | ICD-10-CM | POA: Diagnosis not present

## 2018-03-05 DIAGNOSIS — G3184 Mild cognitive impairment, so stated: Secondary | ICD-10-CM | POA: Insufficient documentation

## 2018-03-05 HISTORY — DX: Mild cognitive impairment of uncertain or unknown etiology: G31.84

## 2018-03-05 NOTE — Patient Instructions (Signed)
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 Generalized anxiety disorder, moderate. Results of cognitive testing are abnormal, and even when interpreted in the context of low level of education there are areas of significant impairment. Additionally, there is evidence that her cognitive deficits are beginning to interfere with her ability to manage complex tasks (e.g., manage appointments such as her recent jury duty). As such, diagnostic criteria for a dementia syndrome are met. 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 The patient's cognitive profile indicates severe impairments in verbal memory. Additionally, auditory comprehension of complex ideational material, and certain aspects of verbal fluency, are severely impaired. Other aspects of cognitive functioning, including processing speed, attention, visual-spatial construction, and executive functioning, were all within expected ranges for her age and estimated premorbid baseline. Given her clinical features and cognitive profile, early stage Alzheimer's disease is considered the most likely etiology. However, neuroimaging to rule out structural abnormality and to assess vascular load (if brain MRI or CT has not already been performed) will assist with differential diagnosis of etiology.  The patient also presents with mild depression and more significant generalized anxiety, however I do not think this alone would cause the level of impairment and the specific cognitive profile that we are seeing in this patient. These issues may exacerbate underlying cognitive dysfunction but I doubt they are the sole cause.    Recommendations/Plan: Based on the findings of the present evaluation, the following recommendations are offered:  1. Brain MRI is recommended as discussed above. 2. Treatment for depression/anxiety: May want to consider psychiatry consultation to  evaluate effectiveness of current medication. I also think she could benefit from supportive counseling (being provided a summary of each session will help with carryover, given memory difficulties). 3. Continue regular CPAP use, and consider updated sleep study (if one hasn't been performed in the past year) given the patient's complaint of poor sleep. Untreated sleep apnea can exacerbate underlying dementia. 4. She appears to be an appropriate candidate for cholinesterase inhibitor therapy; she can speak with her PCP or see one of our neurologists if she is interested in discussing this further. 5. At this time, I recommend limiting driving to familiar local areas, avoiding high traffic areas if possible, given her memory difficulty. However it is reassuring that she performed quite well on a cognitive test highly correlated with driving ability (Trails B). 6. She should utilize a daily/weekly pill planner for medications and have a family member monitor these to ensure no errors are made. 7. She should utilize one calendar and have alarm reminders if necessary for appointments. I recommend that a family member be aware of her appointments as well and accompany her to important ones. 8. Financial POA and healthcare POA are recommended to be put in place if they are not already. 9. Neuropsychological re-evaluation in 12 months will assist in monitoring cognitive status, tracking progression of symptoms and further contributing to treatment planning.

## 2018-03-12 DIAGNOSIS — M25512 Pain in left shoulder: Secondary | ICD-10-CM | POA: Insufficient documentation

## 2018-03-12 DIAGNOSIS — S43439A Superior glenoid labrum lesion of unspecified shoulder, initial encounter: Secondary | ICD-10-CM | POA: Insufficient documentation

## 2018-03-12 HISTORY — DX: Superior glenoid labrum lesion of unspecified shoulder, initial encounter: S43.439A

## 2018-03-12 HISTORY — DX: Pain in left shoulder: M25.512

## 2018-03-17 ENCOUNTER — Other Ambulatory Visit: Payer: Self-pay | Admitting: Family Medicine

## 2018-03-17 DIAGNOSIS — R413 Other amnesia: Secondary | ICD-10-CM

## 2018-03-21 ENCOUNTER — Ambulatory Visit
Admission: RE | Admit: 2018-03-21 | Discharge: 2018-03-21 | Disposition: A | Discharge status: Home / Self Care | Payer: Medicare Other | Source: Ambulatory Visit | Attending: Family Medicine | Admitting: Family Medicine

## 2018-03-21 DIAGNOSIS — R413 Other amnesia: Secondary | ICD-10-CM

## 2018-03-21 MED ORDER — GADOBENATE DIMEGLUMINE 529 MG/ML IV SOLN
19.0000 mL | Freq: Once | INTRAVENOUS | Status: AC | PRN
  Administered 2018-03-21: 19 mL via INTRAVENOUS

## 2018-06-01 ENCOUNTER — Ambulatory Visit: Payer: Medicare Other | Admitting: Cardiovascular Disease

## 2018-06-01 ENCOUNTER — Encounter: Payer: Self-pay | Admitting: Cardiovascular Disease

## 2018-06-01 VITALS — BP 130/70 | HR 64 | Ht 67.0 in | Wt 205.8 lb

## 2018-06-01 DIAGNOSIS — G4733 Obstructive sleep apnea (adult) (pediatric): Secondary | ICD-10-CM

## 2018-06-01 DIAGNOSIS — E669 Obesity, unspecified: Secondary | ICD-10-CM

## 2018-06-01 DIAGNOSIS — I10 Essential (primary) hypertension: Secondary | ICD-10-CM | POA: Diagnosis not present

## 2018-06-01 DIAGNOSIS — R4189 Other symptoms and signs involving cognitive functions and awareness: Secondary | ICD-10-CM

## 2018-06-01 NOTE — Progress Notes (Signed)
PCP: Dr. [name] Primary cardiologist: Dr. Sallyanne Kuster; Dr. Claiborne Billings (Sleep)   HPI: Charlotte [name] is a 67 y.o. female is a previous diagnosis of obstructive sleep apnea.  She presents for 2-year follow-up evaluation.    Charlotte [name] has a history of asthma, hypertension, hyperlipidemia, hypothyroidism, and is followed by Dr. Sallyanne Kuster for cardiology care.  She was referred for a sleep study due to concerns of obstructive sleep apnea.  She underwent a split-night study on 04/01/2016.  This demonstrated severe obstructive sleep apnea with an AHI of 32.7 per hour.  She had a significant positional component with supine sleep AHI at 42.4 per hour.  She was unable to achieve any REM sleep on the baseline portion of the study.  CPAP titration was initiated titrated up to 11 cm water pressure.  During her study .  She had soft snoring.  There was a significantly abnormal arousal index area.  She was in sinus rhythm and had a rare P EC.  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  She was set up with CPAP on 05/03/2016. A download was obtained from 05/20/2016 through August 1 17.  She had only 27% of days of usage and only 17% of days greater than 4 hours of use.  She was only averaging 4 hours and 24 minutes.  On 11 cm water pressure, her AHI was 4.0, with an apnea index was 3.8 with a hypopnea index of 0.2.  Since she underwent shoulder surgery  she has been sleeping in a recliner since her surgery.  She has had pain in her shoulder which has been limiting her sleep.  When I last saw her in August 2017 an Epworth sleepiness scale score was calculated  as shown below and this endorsed at 16 compatible with significant excessive daytime sleepiness.  Epworth Sleepiness Scale: Situation   Chance of Dozing/Sleeping (0 = never , 1 = slight chance , 2 = moderate chance , 3 = high chance )   sitting and reading 2   watching TV 2   sitting inactive in a public place  2   being a passenger in a motor vehicle for an hour or more 2   lying down in the afternoon 2   sitting and talking to someone 2   sitting quietly after lunch (no alcohol) 2   while stopped for a few minutes in traffic as the driver 2   Total Score  16   Since I last saw her, she states that she has not been using her CPAP therapy for some time.  She had recently been seen by Cristopher Peru, PsyD for a neurology evaluation there has been some issues with cognitive functioning.  During that evaluation it was recommended that with complaints of poor sleep the fact that she had not been using her CPAP therapy at this could be exacerbating underlying dementia.  States that she would like to reinstitute therapy.  Epworth Sleepiness Scale score was recalculated in the office today and this also endorsed at 16 and is consistent with significant excessive daytime sleepiness.  She  now presents for evaluation.   Past Medical History:  Diagnosis Date  . Anxiety   . Arthritis   . Asthma 01/27/2016  . Depression   . Essential hypertension 01/27/2016  . GERD (gastroesophageal reflux disease)   . HNP (herniated nucleus pulposus) 03/2017  . Hyperlipidemia 01/27/2016  . Hypothyroidism 01/27/2016  . Shortness  of breath dyspnea    relating to asthma  . Sleep apnea   . Syncope 01/27/2016    Past Surgical History:  Procedure Laterality Date  . ABDOMINAL HYSTERECTOMY    . BREAST REDUCTION SURGERY  2011  . CERVICAL DISC ARTHROPLASTY N/A 04/01/2017   Procedure: CERVICAL ANTERIOR DISC ARTHROPLASTY C5-C6;  Surgeon: Kristeen Miss, MD;  Location: [facility];  Service: Neurosurgery;  Laterality: N/A;  . COLONOSCOPY    . ESOPHAGOGASTRODUODENOSCOPY    . REDUCTION MAMMAPLASTY    . REVERSE SHOULDER ARTHROPLASTY Right 08/02/2016   Procedure: REVERSE SHOULDER ARTHROPLASTY;  Surgeon: Netta Cedars, MD;  Location: [facility];  Service: Orthopedics;  Laterality: Right;    Allergies  Allergen Reactions  . No Known  Allergies     Current Outpatient Medications  Medication Sig Dispense Refill  . albuterol (PROVENTIL HFA;VENTOLIN HFA) 108 (90 Base) MCG/ACT inhaler Inhale 2 puffs into the lungs every 6 (six) hours as needed for wheezing or shortness of breath.     Marland Kitchen aspirin 81 MG tablet Take 81 mg by mouth daily.    . furosemide (LASIX) 20 MG tablet Take 1 tablet (20 mg total) by mouth daily. NEED OV. 30 tablet 0  . levothyroxine (SYNTHROID, LEVOTHROID) 50 MCG tablet Take 50 mcg by mouth daily.     Marland Kitchen lisinopril-hydrochlorothiazide (PRINZIDE,ZESTORETIC) 10-12.5 MG tablet Take 1 tablet by mouth daily.    Marland Kitchen LORazepam (ATIVAN) 1 MG tablet     . methocarbamol (ROBAXIN) 500 MG tablet Take 1 tablet (500 mg total) by mouth 3 (three) times daily as needed. (Patient taking differently: Take 500 mg by mouth 3 (three) times daily as needed for muscle spasms. ) 60 tablet 1  . methocarbamol (ROBAXIN) 500 MG tablet Take 1 tablet (500 mg total) by mouth every 6 (six) hours as needed for muscle spasms. 30 tablet 1  . simvastatin (ZOCOR) 40 MG tablet Take 40 mg by mouth at bedtime.      No current facility-administered medications for this visit.     Social History   Socioeconomic History  . Marital status: Widowed    Spouse name: Not on file  . Number of children: Not on file  . Years of education: Not on file  . Highest education level: Not on file  Occupational History  . Not on file  Social Needs  . Financial resource strain: Not on file  . Food insecurity:    Worry: Not on file    Inability: Not on file  . Transportation needs:    Medical: Not on file    Non-medical: Not on file  Tobacco Use  . Smoking status: Never Smoker  . Smokeless tobacco: Never Used  Substance and Sexual Activity  . Alcohol use: No    Alcohol/week: 0.0 oz  . Drug use: No  . Sexual activity: Not on file  Lifestyle  . Physical activity:    Days per week: Not on file    Minutes per session: Not on file  . Stress: Not on file    Relationships  . Social connections:    Talks on phone: Not on file    Gets together: Not on file    Attends religious service: Not on file    Active member of club or organization: Not on file    Attends meetings of clubs or organizations: Not on file    Relationship status: Not on file  . Intimate partner violence:    Fear of current or ex partner: Not  on file    Emotionally abused: Not on file    Physically abused: Not on file    Forced sexual activity: Not on file  Other Topics Concern  . Not on file  Social History Narrative   Epworth Sleepiness Scale = 17 (as of 02/16/2016)    Family History  Problem Relation Age of Onset  . Heart failure Mother   . Diabetes Mother   . Hypertension Mother   . Hyperlipidemia Mother   . Lung cancer Father   . Diabetes Father   . Hypertension Father   . Hypertension Sister   . Hyperlipidemia Sister   . Hyperthyroidism Sister   . Lung cancer Brother   . Rheum arthritis Child   . Lung cancer Child   . Dementia Maternal Grandmother   . Heart disease Maternal Grandfather   . Lung cancer Paternal Grandfather   . Diabetes Brother   . Hyperlipidemia Brother   . Hyperlipidemia Brother   . Dementia Maternal Aunt      ROS General: Negative; No fevers, chills, or night sweats HEENT: Negative; No changes in vision or hearing, sinus congestion, difficulty swallowing Pulmonary: Positive for asthma Cardiovascular: Negative; No chest pain, presyncope, syncope, palpatations GI: Negative; No nausea, vomiting, diarrhea, or abdominal pain GU: Negative; No dysuria, hematuria, or difficulty voiding Musculoskeletal: Negative; no myalgias, joint pain, or weakness Hematologic: Negative; no easy bruising, bleeding Endocrine: Positive for hypothyroidism. Neuro: Negative; no changes in balance, headaches Skin: Negative; No rashes or skin lesions Psychiatric: Negative; No behavioral problems, depression Sleep: Positive for OSA.  Recently started on  CPAP with poor compliance.  Positive for daytime sleepiness, and hypersomnolence; no bruxism, restless legs, hypnogognic hallucinations, no cataplexy   Physical Exam BP 130/70   Pulse 64   Ht 5\' 7"  (1.702 m)   Wt 205 lb 12.8 oz (93.4 kg)   BMI 32.23 kg/m    Repeat blood pressure by me was 160/72   Wt Readings from Last 3 Encounters:  06/01/18 205 lb 12.8 oz (93.4 kg)  04/01/17 208 lb (94.3 kg)  10/07/16 205 lb 6.4 oz (93.2 kg)   General: Alert, oriented, no distress.  Skin: normal turgor, no rashes, warm and dry HEENT: Normocephalic, atraumatic. Pupils equal round and reactive to light; sclera anicteric; extraocular muscles intact; Nose without nasal septal hypertrophy Mouth/Parynx benign; Mallinpatti scale 3 Neck: No JVD, no carotid bruits; normal carotid upstroke Lungs: clear to ausculatation and percussion; no wheezing or rales Chest wall: without tenderness to palpitation Heart: PMI not displaced, RRR, s1 s2 normal, 1/6 systolic murmur, no diastolic murmur, no rubs, gallops, thrills, or heaves Abdomen: soft, nontender; no hepatosplenomehaly, BS+; abdominal aorta nontender and not dilated by palpation. Back: no CVA tenderness Pulses 2+ Musculoskeletal: full range of motion, normal strength, no joint deformities Extremities: no clubbing cyanosis or edema, Homan's sign negative  Neurologic: grossly nonfocal; Cranial nerves grossly wnl Psychologic: Normal mood and affect  ECG (independently read by me): Normal sinus rhythm at 64 bpm.  Baseline wander.  No ectopy.  Normal intervals.  ECG (independently read by me): Not done today, but the ECG from 04/22/2016 was reviewed which showed sinus rhythm with inferolateral ST changes  LABS:  BMP Latest Ref Rng & Units 04/01/2017 08/03/2016 07/25/2016  Glucose 65 - 99 mg/dL 96 132(H) 105(H)  BUN 6 - 20 mg/dL 14 15 9   Creatinine 0.44 - 1.00 mg/dL 0.81 0.97 0.86  Sodium 135 - 145 mmol/L 140 138 141  Potassium 3.5 - 5.1 mmol/L  3.7 3.8  4.1  Chloride 101 - 111 mmol/L 108 103 108  CO2 22 - 32 mmol/L 25 27 25   Calcium 8.9 - 10.3 mg/dL 9.3 8.9 9.2     No flowsheet data found.   CBC Latest Ref Rng & Units 04/01/2017 08/03/2016 07/25/2016  WBC 4.0 - 10.5 K/uL 6.6 - 5.7  Hemoglobin 12.0 - 15.0 g/dL 12.7 11.5(L) 11.7(L)  Hematocrit 36.0 - 46.0 % 37.9 36.0 36.4  Platelets 150 - 400 K/uL 167 - 182     Lipid Panel  No results found for: CHOL, TRIG, HDL, CHOLHDL, VLDL, LDLCALC, LDLDIRECT   RADIOLOGY: No results found.  IMPRESSION: 1. OSA (obstructive sleep apnea)   2. Essential hypertension   3. Cognitive decline   4. Mild obesity     ASSESSMENT AND PLAN: Charlotte [name] is a 67 year old female who has a history of hypertension, asthma, hypothyroidism, hyperlipidemia, and was found to have severe obstructive sleep apnea on her sleep study from May 2017.  On the diagnostic portion of the study, her AHI was 32.7 per hour but her RDI was significantly increased at 67.8 per hour.  She was unable to achieve any REM sleep.  She was titrated up to 11 cm water pressure with excellent benefit.  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  She essentially has not used CPAP therapy for the last several years.  She has had issues with cognitive function with possible dementia and was recently seen by neurology and psychiatry was discussion that her untreated sleep apnea could be contributing to her progressive dementia.  Had a long discussion with her today.  She would like to reinstitute therapy.  I again discussed the adverse consequences on untreated sleep apnea with reference to blood pressure, cardiovascular health, in addition to glucose intolerance, GERD, as well as neurologic events.  In the past, [facility] [employer] was her DME company.  She will plan to reinitiate therapy and  had [facility] Medicare insurance.  Discussed the importance of weight loss and exercise with reference to OSA benefit.   I will see her for follow-up evaluation.  Troy Sine, MD, [facility]  06/07/2018 1:07 PM

## 2018-06-01 NOTE — Patient Instructions (Signed)
Follow-Up: Your physician wants you to follow-up in: 1 year with Dr. Kelly (sleep clinic). You will receive a reminder letter in the mail two months in advance. If you don't receive a letter, please call our office to schedule the follow-up appointment.    If you need a refill on your cardiac medications before your next appointment, please call your pharmacy.   

## 2018-06-05 ENCOUNTER — Encounter: Payer: Self-pay | Admitting: Psychology

## 2018-06-07 ENCOUNTER — Encounter: Payer: Self-pay | Admitting: Cardiovascular Disease

## 2018-06-09 ENCOUNTER — Telehealth: Payer: Self-pay | Admitting: Cardiovascular Disease

## 2018-06-09 NOTE — Telephone Encounter (Signed)
Pt's daughter calling and wanting to know the status of the pt's mask replacement. Please advise daughter.

## 2018-06-10 NOTE — Telephone Encounter (Signed)
Left message that [facility] is working on getting her mask. They were waiting for Dr Claiborne Billings to sign the order, which was done and sent to them yesterday.

## 2018-07-17 ENCOUNTER — Telehealth: Payer: Self-pay | Admitting: *Deleted

## 2018-07-17 NOTE — Telephone Encounter (Signed)
Called and left CPAP compliance report ( 06/14/18 to 07/13/18) and recommendations on patient's home voicemail. ( ok per dpr)

## 2018-09-30 ENCOUNTER — Telehealth: Payer: Self-pay | Admitting: Psychology

## 2018-09-30 NOTE — Telephone Encounter (Signed)
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 We have mailed a letter to patient to let her know we have canceled all appointments with Bailar. Thank you for your understanding °

## 2018-10-29 ENCOUNTER — Telehealth: Payer: Self-pay | Admitting: Psychology

## 2018-10-29 NOTE — Telephone Encounter (Signed)
Spoke with daughter regarding letter that was mailed ([name] leaving). Daughter stated at this time that her mom's PCP is monitoring any cognitive changes and will refer her for testing if needed.

## 2018-11-23 ENCOUNTER — Other Ambulatory Visit: Payer: Self-pay | Admitting: Family Medicine

## 2018-11-23 DIAGNOSIS — Z1231 Encounter for screening mammogram for malignant neoplasm of breast: Secondary | ICD-10-CM

## 2018-12-22 ENCOUNTER — Ambulatory Visit
Admission: RE | Admit: 2018-12-22 | Discharge: 2018-12-22 | Disposition: A | Discharge status: Home / Self Care | Payer: Medicare Other | Source: Ambulatory Visit | Attending: Family Medicine | Admitting: Family Medicine

## 2018-12-22 DIAGNOSIS — Z1231 Encounter for screening mammogram for malignant neoplasm of breast: Secondary | ICD-10-CM

## 2019-03-08 ENCOUNTER — Encounter: Payer: Medicare Other | Admitting: Psychology

## 2019-03-15 ENCOUNTER — Encounter: Payer: Medicare Other | Admitting: Psychology

## 2019-09-02 ENCOUNTER — Other Ambulatory Visit (HOSPITAL_COMMUNITY): Payer: Self-pay | Admitting: Family Medicine

## 2019-09-02 DIAGNOSIS — R0600 Dyspnea, unspecified: Secondary | ICD-10-CM

## 2019-09-02 DIAGNOSIS — R0609 Other forms of dyspnea: Secondary | ICD-10-CM

## 2019-09-08 ENCOUNTER — Other Ambulatory Visit: Payer: Self-pay

## 2019-09-08 ENCOUNTER — Ambulatory Visit (HOSPITAL_COMMUNITY): Payer: Medicare Other | Attending: Cardiology

## 2019-09-08 DIAGNOSIS — R0609 Other forms of dyspnea: Secondary | ICD-10-CM

## 2019-09-08 DIAGNOSIS — R0600 Dyspnea, unspecified: Secondary | ICD-10-CM

## 2019-12-13 ENCOUNTER — Ambulatory Visit: Payer: Medicare Other | Attending: Internal Medicine

## 2019-12-13 DIAGNOSIS — Z23 Encounter for immunization: Secondary | ICD-10-CM | POA: Insufficient documentation

## 2019-12-13 NOTE — Progress Notes (Signed)
   Covid-19 Vaccination Clinic  Name:  Vali Alderfer    MRN: [id number] DOB: [birth year]  12/13/2019  Ms. Fansler was observed post Covid-19 immunization for 15 minutes without incidence. She was provided with Vaccine Information Sheet and instruction to access the V-Safe system.   Ms. Hornstein was instructed to call 911 with any severe reactions post vaccine: Marland Kitchen Difficulty breathing  . Swelling of your face and throat  . A fast heartbeat  . A bad rash all over your body  . Dizziness and weakness    Immunizations Administered    Name Date Dose VIS Date Route   Pfizer COVID-19 Vaccine 12/13/2019  8:41 AM 0.3 mL 10/29/2019 Intramuscular   Manufacturer: [place]   Lot: [id number]   [place]: [id number]

## 2020-01-03 ENCOUNTER — Ambulatory Visit: Payer: Medicare Other | Attending: Internal Medicine

## 2020-01-03 DIAGNOSIS — Z23 Encounter for immunization: Secondary | ICD-10-CM

## 2020-01-03 NOTE — Progress Notes (Signed)
   Covid-19 Vaccination Clinic  Name:  Aulii Martinie    MRN: [id number] DOB: [birth year]  01/03/2020  Ms. Onan was observed post Covid-19 immunization for 15 minutes without incidence. She was provided with Vaccine Information Sheet and instruction to access the V-Safe system.   Ms. Rakoczy was instructed to call 911 with any severe reactions post vaccine: Marland Kitchen Difficulty breathing  . Swelling of your face and throat  . A fast heartbeat  . A bad rash all over your body  . Dizziness and weakness    Immunizations Administered    Name Date Dose VIS Date Route   Pfizer COVID-19 Vaccine 01/03/2020  8:48 AM 0.3 mL 10/29/2019 Intramuscular   Manufacturer: [place]   Lot: [id number]   [place]: [id number]

## 2020-02-11 ENCOUNTER — Other Ambulatory Visit: Payer: Self-pay | Admitting: Family Medicine

## 2020-02-11 DIAGNOSIS — Z1231 Encounter for screening mammogram for malignant neoplasm of breast: Secondary | ICD-10-CM

## 2020-02-29 ENCOUNTER — Other Ambulatory Visit: Payer: Self-pay

## 2020-02-29 ENCOUNTER — Ambulatory Visit
Admission: RE | Admit: 2020-02-29 | Discharge: 2020-02-29 | Disposition: A | Discharge status: Home / Self Care | Payer: Medicare Other | Source: Ambulatory Visit | Attending: Family Medicine | Admitting: Family Medicine

## 2020-02-29 DIAGNOSIS — Z1231 Encounter for screening mammogram for malignant neoplasm of breast: Secondary | ICD-10-CM

## 2020-03-03 ENCOUNTER — Ambulatory Visit
Admission: RE | Admit: 2020-03-03 | Discharge: 2020-03-03 | Disposition: A | Discharge status: Home / Self Care | Payer: Medicare Other | Source: Ambulatory Visit | Attending: Family Medicine | Admitting: Family Medicine

## 2020-03-03 ENCOUNTER — Other Ambulatory Visit: Payer: Self-pay | Admitting: Family Medicine

## 2020-03-03 DIAGNOSIS — R29898 Other symptoms and signs involving the musculoskeletal system: Secondary | ICD-10-CM

## 2020-03-08 ENCOUNTER — Other Ambulatory Visit: Payer: Self-pay | Admitting: Family Medicine

## 2020-03-08 DIAGNOSIS — M479 Spondylosis, unspecified: Secondary | ICD-10-CM

## 2020-03-08 DIAGNOSIS — R29898 Other symptoms and signs involving the musculoskeletal system: Secondary | ICD-10-CM

## 2020-03-12 ENCOUNTER — Ambulatory Visit (INDEPENDENT_AMBULATORY_CARE_PROVIDER_SITE_OTHER): Payer: Medicare Other

## 2020-03-12 ENCOUNTER — Other Ambulatory Visit: Payer: Self-pay

## 2020-03-12 DIAGNOSIS — M479 Spondylosis, unspecified: Secondary | ICD-10-CM

## 2020-03-12 DIAGNOSIS — R29898 Other symptoms and signs involving the musculoskeletal system: Secondary | ICD-10-CM

## 2020-03-25 ENCOUNTER — Other Ambulatory Visit: Payer: Medicare Other

## 2020-05-15 ENCOUNTER — Encounter: Payer: Self-pay | Admitting: Cardiovascular Disease

## 2020-05-15 ENCOUNTER — Ambulatory Visit (INDEPENDENT_AMBULATORY_CARE_PROVIDER_SITE_OTHER): Payer: Medicare Other | Admitting: Cardiovascular Disease

## 2020-05-15 ENCOUNTER — Other Ambulatory Visit: Payer: Self-pay

## 2020-05-15 VITALS — BP 126/74 | HR 59 | Ht 67.0 in | Wt 207.0 lb

## 2020-05-15 DIAGNOSIS — I10 Essential (primary) hypertension: Secondary | ICD-10-CM | POA: Diagnosis not present

## 2020-05-15 DIAGNOSIS — E669 Obesity, unspecified: Secondary | ICD-10-CM | POA: Diagnosis not present

## 2020-05-15 DIAGNOSIS — R4189 Other symptoms and signs involving cognitive functions and awareness: Secondary | ICD-10-CM | POA: Diagnosis not present

## 2020-05-15 DIAGNOSIS — E039 Hypothyroidism, unspecified: Secondary | ICD-10-CM

## 2020-05-15 DIAGNOSIS — G4733 Obstructive sleep apnea (adult) (pediatric): Secondary | ICD-10-CM | POA: Diagnosis not present

## 2020-05-15 DIAGNOSIS — J45909 Unspecified asthma, uncomplicated: Secondary | ICD-10-CM

## 2020-05-15 MED ORDER — AMLODIPINE BESYLATE 5 MG PO TABS: 5.0000 mg | ORAL_TABLET | Freq: Every day | ORAL | 3 refills | Status: DC

## 2020-05-15 MED ORDER — ROSUVASTATIN CALCIUM 20 MG PO TABS: 20.0000 mg | ORAL_TABLET | Freq: Every day | ORAL | 3 refills | Status: DC

## 2020-05-15 NOTE — Patient Instructions (Signed)
Medication Instructions:  STOP TAKING SIMVASTATIN BEGIN TAKING ROSUVASTATIN 20MG  DAILY BEGIN TAKING AMLODIPINE 5MG  DAILY *If you need a refill on your cardiac medications before your next appointment, please call your pharmacy*   Follow-Up: At [facility], you and your health needs are our priority.  As part of our continuing mission to provide you with exceptional heart care, we have created designated Provider Care Teams.  These Care Teams include your primary Cardiologist (physician) and Advanced Practice Providers (APPs -  Physician Assistants and Nurse Practitioners) who all work together to provide you with the care you need, when you need it.  We recommend signing up for the patient portal called "MyChart".  Sign up information is provided on this After Visit Summary.  MyChart is used to connect with patients for Virtual Visits (Telemedicine).  Patients are able to view lab/test results, encounter notes, upcoming appointments, etc.  Non-urgent messages can be sent to your provider as well.   To learn more about what you can do with MyChart, go to [email or web address].    Your next appointment:   12 month(s)  The format for your next appointment:   In Person  Provider:   [name], MD   Other Instructions FOLLOW UP WITH YOUR PCP IN 2-3 MONTHS FOR LABS

## 2020-05-15 NOTE — Progress Notes (Signed)
PCP: Dr. [name] Primary cardiologist: Dr. Sallyanne Kuster; Dr. Claiborne Billings (Sleep)   HPI: Charlotte [name] is a 69 y.o. female who presents for a 2-year follow-up evaluation.    Ms. Circle has a history of asthma, hypertension, hyperlipidemia, hypothyroidism, and had been followed by Dr. Sallyanne Kuster for cardiology care.  She was referred for a sleep study due to concerns of obstructive sleep apnea.  She underwent a split-night study on 04/01/2016.  This demonstrated severe obstructive sleep apnea with an AHI of 32.7 per hour.  She had a significant positional component with supine sleep AHI at 42.4 per hour.  She was unable to achieve any REM sleep on the baseline portion of the study.  CPAP titration was initiated titrated up to 11 cm water pressure.  During her study .  She had soft snoring.  There was a significantly abnormal arousal index area.  She was in sinus rhythm and had a rare P EC.  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  She was set up with CPAP on 05/03/2016. A download was obtained from 05/20/2016 through August 1 17.  She had only 27% of days of usage and only 17% of days greater than 4 hours of use.  She was only averaging 4 hours and 24 minutes.  On 11 cm water pressure, her AHI was 4.0, with an apnea index was 3.8 with a hypopnea index of 0.2.  Since she underwent shoulder surgery  she has been sleeping in a recliner since her surgery.  She has had pain in her shoulder which has been limiting her sleep.  When I saw her in August 2017 an Epworth sleepiness scale score was calculated  as shown below and this endorsed at 16 compatible with significant excessive daytime sleepiness.  Epworth Sleepiness Scale: Situation   Chance of Dozing/Sleeping (0 = never , 1 = slight chance , 2 = moderate chance , 3 = high chance )   sitting and reading 2   watching TV 2   sitting inactive in a public place 2   being a passenger in a motor  vehicle for an hour or more 2   lying down in the afternoon 2   sitting and talking to someone 2   sitting quietly after lunch (no alcohol) 2   while stopped for a few minutes in traffic as the driver 2   Total Score  16   She was last evaluated by me in July 2019.  She had  been seen by Cristopher Peru, PsyD for a neurology evaluation there has been some issues with cognitive functioning.  During that evaluation it was recommended that with complaints of poor sleep the fact that she had not been using her CPAP therapy at this could be exacerbating underlying dementia.  She had stopped CPAP for some time.  During that evaluation stated that she would try to reinstitute therapy.  Download was obtained 1 month later which did show improve use but she was still not meeting compliance standards with 73% of usage days and only 60% of usage greater than 4 hours.  AHI was 3.2.  She was set on a total device with a range of 7-20 with 95th percentile pressure 11.6.   Since I last saw her, she is now followed primarily by Dr. Serita Grammes and has not seen Dr. Annett Gula.  She has had issues with increasing blood pressure and her lisinopril HCT  dose was recently increased 6 months ago to 20/25 mg.  She is continue to take furosemide 20 mg daily.  I obtained a new download of her CPAP from 829 through May 14, 2020 which again shows poor compliance with only 6 out of 30 days of usage.  Her AHI however is excellent when used at 0.6.  She continues to be on levothyroxine 50 mcg for hypothyroidism.  She is on simvastatin 40 mg for hyperlipidemia.  LDL cholesterol in April 2021 was 101.  She presents for evaluation.   Past Medical History:  Diagnosis Date  . Anxiety   . Arthritis   . Asthma 01/27/2016  . 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 Chest pain at rest 01/27/2016  . Chronic diastolic heart failure ([place]) 04/22/2016  . Depression   . Essential hypertension 01/27/2016  .  Exertional dyspnea 01/27/2016  . GERD (gastroesophageal reflux disease)   . HNP (herniated nucleus pulposus) 03/2017  . HNP (herniated nucleus pulposus) with myelopathy, cervical 04/01/2017  . Hyperlipidemia 01/27/2016  . Hypersomnolence 01/27/2016  . Hypothyroidism 01/27/2016  . Murmur, cardiac 01/27/2016  . OSA (obstructive sleep apnea) 04/22/2016  . Pericardial effusion 01/27/2016  . 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 Shortness of breath dyspnea    relating to asthma  . Sleep apnea   . Syncope 01/27/2016    Past Surgical History:  Procedure Laterality Date  . ABDOMINAL HYSTERECTOMY    . BREAST REDUCTION SURGERY  2011  . CERVICAL DISC ARTHROPLASTY N/A 04/01/2017   Procedure: CERVICAL ANTERIOR DISC ARTHROPLASTY C5-C6;  Surgeon: Kristeen Miss, MD;  Location: [place];  Service: Neurosurgery;  Laterality: N/A;  . COLONOSCOPY    . ESOPHAGOGASTRODUODENOSCOPY    . REDUCTION MAMMAPLASTY    . REVERSE SHOULDER ARTHROPLASTY Right 08/02/2016   Procedure: REVERSE SHOULDER ARTHROPLASTY;  Surgeon: Netta Cedars, MD;  Location: [place];  Service: Orthopedics;  Laterality: Right;    Allergies  Allergen Reactions  . No Known Allergies     Current Outpatient Medications  Medication Sig Dispense Refill  . albuterol (PROVENTIL HFA;VENTOLIN HFA) 108 (90 Base) MCG/ACT inhaler Inhale 2 puffs into the lungs every 6 (six) hours as needed for wheezing or shortness of breath.     Marland Kitchen aspirin 81 MG tablet Take 81 mg by mouth daily.    Marland Kitchen donepezil (ARICEPT) 10 MG tablet Take 10 mg by mouth daily.    . furosemide (LASIX) 20 MG tablet Take 1 tablet (20 mg total) by mouth daily. NEED OV. 30 tablet 0  . levothyroxine (SYNTHROID, LEVOTHROID) 50 MCG tablet Take 50 mcg by mouth daily.     Marland Kitchen lisinopril-hydrochlorothiazide (PRINZIDE,ZESTORETIC) 10-12.5 MG tablet Take 2 tablets by mouth daily.     Marland Kitchen LORazepam (ATIVAN) 1 MG tablet     . sertraline (ZOLOFT) 100 MG tablet Take 100 mg by mouth daily.    . TRELEGY ELLIPTA  100-62.5-25 MCG/INH AEPB Inhale 1 puff into the lungs daily.    Marland Kitchen amLODipine (NORVASC) 5 MG tablet Take 1 tablet (5 mg total) by mouth daily. 90 tablet 3  . rosuvastatin (CRESTOR) 20 MG tablet Take 1 tablet (20 mg total) by mouth daily. 90 tablet 3   No current facility-administered medications for this visit.    Social History   Socioeconomic History  . Marital status: Widowed    Spouse name: Not on file  . Number of children: Not on file  . Years of education: Not on file  . Highest education  level: Not on file  Occupational History  . Not on file  Tobacco Use  . Smoking status: Never Smoker  . Smokeless tobacco: Never Used  Vaping Use  . Vaping Use: Never used  Substance and Sexual Activity  . Alcohol use: No    Alcohol/week: 0.0 standard drinks  . Drug use: No  . Sexual activity: Not on file  Other Topics Concern  . Not on file  Social History Narrative   Epworth Sleepiness Scale = 17 (as of 02/16/2016)   Social Determinants of Health   Financial Resource Strain:   . Difficulty of Paying Living Expenses:   Food Insecurity:   . Worried About Charity fundraiser in the Last Year:   . Arboriculturist in the Last Year:   Transportation Needs:   . Film/video editor (Medical):   Marland Kitchen Lack of Transportation (Non-Medical):   Physical Activity:   . Days of Exercise per Week:   . Minutes of Exercise per Session:   Stress:   . Feeling of Stress :   Social Connections:   . Frequency of Communication with Friends and Family:   . Frequency of Social Gatherings with Friends and Family:   . Attends Religious Services:   . Active Member of Clubs or Organizations:   . Attends Archivist Meetings:   Marland Kitchen Marital Status:   Intimate Partner Violence:   . Fear of Current or Ex-Partner:   . Emotionally Abused:   Marland Kitchen Physically Abused:   . Sexually Abused:     Family History  Problem Relation Age of Onset  . Heart failure Mother   . Diabetes Mother   . Hypertension  Mother   . Hyperlipidemia Mother   . Lung cancer Father   . Diabetes Father   . Hypertension Father   . Hypertension Sister   . Hyperlipidemia Sister   . Hyperthyroidism Sister   . Lung cancer Brother   . Rheum arthritis Child   . Lung cancer Child   . Dementia Maternal Grandmother   . Heart disease Maternal Grandfather   . Lung cancer Paternal Grandfather   . Diabetes Brother   . Hyperlipidemia Brother   . Hyperlipidemia Brother   . Dementia Maternal Aunt      ROS General: Negative; No fevers, chills, or night sweats HEENT: Negative; No changes in vision or hearing, sinus congestion, difficulty swallowing Pulmonary: Positive for asthma Cardiovascular: Negative; No chest pain, presyncope, syncope, palpatations GI: Negative; No nausea, vomiting, diarrhea, or abdominal pain GU: Negative; No dysuria, hematuria, or difficulty voiding Musculoskeletal: Low back discomfort at L4 and 5 Hematologic: Negative; no easy bruising, bleeding Endocrine: Positive for hypothyroidism. Neuro: Intermittent sciatica down her left leg Skin: Negative; No rashes or skin lesions Psychiatric: Negative; No behavioral problems, depression Sleep: Positive for OSA.  On CPAP with poor compliance.  Positive for daytime sleepiness, and hypersomnolence; no bruxism, restless legs, hypnogognic hallucinations, no cataplexy   Physical Exam BP 126/74   Pulse (!) 59   Ht 5\' 7"  (1.702 m)   Wt 207 lb (93.9 kg)   SpO2 98%   BMI 32.42 kg/m    Repeat blood pressure by me was elevated at 160/78 on 3 separate readings   Wt Readings from Last 3 Encounters:  05/15/20 207 lb (93.9 kg)  06/01/18 205 lb 12.8 oz (93.4 kg)  04/01/17 208 lb (94.3 kg)    General: Alert, oriented, no distress.  Skin: normal turgor, no rashes, warm and dry  HEENT: Normocephalic, atraumatic. Pupils equal round and reactive to light; sclera anicteric; extraocular muscles intact; Nose without nasal septal hypertrophy Mouth/Parynx benign;  Mallinpatti scale 3 Neck: No JVD, no carotid bruits; normal carotid upstroke Lungs: clear to ausculatation and percussion; no wheezing or rales Chest wall: without tenderness to palpitation Heart: PMI not displaced, RRR, s1 s2 normal, 1/6 systolic murmur, no diastolic murmur, no rubs, gallops, thrills, or heaves Abdomen: soft, nontender; no hepatosplenomehaly, BS+; abdominal aorta nontender and not dilated by palpation. Back: no CVA tenderness Pulses 2+ Musculoskeletal: full range of motion, normal strength, no joint deformities Extremities: no clubbing cyanosis or edema, Homan's sign negative  Neurologic: grossly nonfocal; Cranial nerves grossly wnl Psychologic: Normal mood and affect  ECG (independently read by me): Sinus bradycardia 59 bpm.  No ectopy.  Normal intervals.  July 2019 ECG (independently read by me): Normal sinus rhythm at 64 bpm.  Baseline wander.  No ectopy.  Normal intervals.  ECG (independently read by me): Not done today, but the ECG from 04/22/2016 was reviewed which showed sinus rhythm with inferolateral ST changes  LABS:  BMP Latest Ref Rng & Units 04/01/2017 08/03/2016 07/25/2016  Glucose 65 - 99 mg/dL 96 132(H) 105(H)  BUN 6 - 20 mg/dL 14 15 9   Creatinine 0.44 - 1.00 mg/dL 0.81 0.97 0.86  Sodium 135 - 145 mmol/L 140 138 141  Potassium 3.5 - 5.1 mmol/L 3.7 3.8 4.1  Chloride 101 - 111 mmol/L 108 103 108  CO2 22 - 32 mmol/L 25 27 25   Calcium 8.9 - 10.3 mg/dL 9.3 8.9 9.2     No flowsheet data found.   CBC Latest Ref Rng & Units 04/01/2017 08/03/2016 07/25/2016  WBC 4.0 - 10.5 K/uL 6.6 - 5.7  Hemoglobin 12.0 - 15.0 g/dL 12.7 11.5(L) 11.7(L)  Hematocrit 36 - 46 % 37.9 36.0 36.4  Platelets 150 - 400 K/uL 167 - 182     Lipid Panel  No results found for: CHOL, TRIG, HDL, CHOLHDL, VLDL, LDLCALC, LDLDIRECT   RADIOLOGY: No results found.  IMPRESSION: 1. Essential hypertension   2. OSA (obstructive sleep apnea)   3. Cognitive decline   4. Mild obesity   5.  Hypothyroidism, unspecified type   6. Mild asthma without complication, unspecified whether persistent     ASSESSMENT AND PLAN: Ms. Carolyn Sylvia is a 59 -year-old female who has a history of hypertension, asthma, hypothyroidism, hyperlipidemia, and was found to have severe obstructive sleep apnea on her sleep study from May 2017.  On the diagnostic portion of the study, her AHI was 32.7 per hour but her RDI was significantly increased at 67.8 per hour.  She was unable to achieve any REM sleep.  She was titrated up to 11 cm water pressure with excellent benefit.  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  When I last saw her in 2019 she had not used CPAP for several years.  At that time I had lengthy discussion with her and she did initially reinitiate therapy with subsequent download 1 month later.  However over the past several years she states that she again has become lazy and has not used treatment insistently.  Over the past month, she had only used treatment for 6 out of 30 days averaging 4 hours and 5 minutes.  Her 95th percentile pressure is 10.0 and her AHI is excellent when used at 0.6/h.  I again had a long discussion with her today regarding the effects  of untreated deep apnea on her cardiovascular health.  I discussed with her that this may be playing a role with her difficult to control blood pressure particularly if she does have severe sleep apnea during REM sleep.  Also discussed its implications with potential nocturnal arrhythmias and even ischemic events.  Her blood pressure today is elevated despite being on lisinopril HCT 20/25 mg and furosemide 40 mg daily.  I am adding amlodipine 5 mg to her medical regimen with target blood pressure less than 130/80.  Her recent laboratory by Dr. Manuella Ghazi was reviewed.  She has been on simvastatin 40 mg and LDL cholesterol remains slightly elevated at 101.  I have suggested she change this to rosuvastatin 20 mg  for more aggressive therapy.  She continues to be on levothyroxine for hypothyroidism and currently is on 50 mcg.  She has asthma and is on trilogy Ellipta.  Due to memory issues she is on Aricept.  I have recommended that she see Dr. Brigitte Pulse in the next 2 to 3 months and repeat laboratory be obtained and at that time her blood pressure can be followed up closely.  She sees Dr. Brigitte Pulse at least 2 times per year.  As long as she remains stable I will see her in 1 year for reevaluation or sooner as needed.   Troy Sine, MD, [facility]  05/15/2020 9:11 AM

## 2020-09-28 ENCOUNTER — Other Ambulatory Visit: Payer: Self-pay | Admitting: Family Medicine

## 2020-09-28 DIAGNOSIS — E2839 Other primary ovarian failure: Secondary | ICD-10-CM

## 2021-01-05 ENCOUNTER — Other Ambulatory Visit: Payer: Medicare Other

## 2021-01-16 DIAGNOSIS — G2581 Restless legs syndrome: Secondary | ICD-10-CM | POA: Diagnosis not present

## 2021-01-26 ENCOUNTER — Other Ambulatory Visit: Payer: Self-pay | Admitting: Family Medicine

## 2021-01-26 DIAGNOSIS — Z1231 Encounter for screening mammogram for malignant neoplasm of breast: Secondary | ICD-10-CM

## 2021-02-15 DIAGNOSIS — H40011 Open angle with borderline findings, low risk, right eye: Secondary | ICD-10-CM | POA: Diagnosis not present

## 2021-02-15 DIAGNOSIS — H2513 Age-related nuclear cataract, bilateral: Secondary | ICD-10-CM | POA: Diagnosis not present

## 2021-02-15 DIAGNOSIS — D3132 Benign neoplasm of left choroid: Secondary | ICD-10-CM | POA: Diagnosis not present

## 2021-03-07 ENCOUNTER — Other Ambulatory Visit: Payer: Self-pay | Admitting: Cardiovascular Disease

## 2021-03-08 ENCOUNTER — Other Ambulatory Visit: Payer: Medicare Other

## 2021-03-22 ENCOUNTER — Ambulatory Visit
Admission: RE | Admit: 2021-03-22 | Discharge: 2021-03-22 | Disposition: A | Discharge status: Home / Self Care | Payer: Medicare Other | Source: Ambulatory Visit | Attending: Family Medicine | Admitting: Family Medicine

## 2021-03-22 ENCOUNTER — Other Ambulatory Visit: Payer: Self-pay

## 2021-03-22 DIAGNOSIS — Z1231 Encounter for screening mammogram for malignant neoplasm of breast: Secondary | ICD-10-CM | POA: Diagnosis not present

## 2021-03-23 ENCOUNTER — Other Ambulatory Visit: Payer: Self-pay | Admitting: Family Medicine

## 2021-03-23 DIAGNOSIS — R7301 Impaired fasting glucose: Secondary | ICD-10-CM | POA: Diagnosis not present

## 2021-03-23 DIAGNOSIS — J45909 Unspecified asthma, uncomplicated: Secondary | ICD-10-CM | POA: Diagnosis not present

## 2021-03-23 DIAGNOSIS — I110 Hypertensive heart disease with heart failure: Secondary | ICD-10-CM | POA: Diagnosis not present

## 2021-03-23 DIAGNOSIS — I5030 Unspecified diastolic (congestive) heart failure: Secondary | ICD-10-CM | POA: Diagnosis not present

## 2021-03-23 DIAGNOSIS — R928 Other abnormal and inconclusive findings on diagnostic imaging of breast: Secondary | ICD-10-CM

## 2021-03-23 DIAGNOSIS — E782 Mixed hyperlipidemia: Secondary | ICD-10-CM | POA: Diagnosis not present

## 2021-03-23 DIAGNOSIS — E039 Hypothyroidism, unspecified: Secondary | ICD-10-CM | POA: Diagnosis not present

## 2021-03-30 DIAGNOSIS — H2513 Age-related nuclear cataract, bilateral: Secondary | ICD-10-CM | POA: Diagnosis not present

## 2021-03-30 DIAGNOSIS — H25043 Posterior subcapsular polar age-related cataract, bilateral: Secondary | ICD-10-CM | POA: Diagnosis not present

## 2021-03-30 DIAGNOSIS — H18413 Arcus senilis, bilateral: Secondary | ICD-10-CM | POA: Diagnosis not present

## 2021-03-30 DIAGNOSIS — H40013 Open angle with borderline findings, low risk, bilateral: Secondary | ICD-10-CM | POA: Diagnosis not present

## 2021-03-30 DIAGNOSIS — H2511 Age-related nuclear cataract, right eye: Secondary | ICD-10-CM | POA: Diagnosis not present

## 2021-04-06 DIAGNOSIS — G4733 Obstructive sleep apnea (adult) (pediatric): Secondary | ICD-10-CM | POA: Diagnosis not present

## 2021-04-12 ENCOUNTER — Other Ambulatory Visit: Payer: Self-pay | Admitting: Family Medicine

## 2021-04-12 ENCOUNTER — Other Ambulatory Visit: Payer: Self-pay

## 2021-04-12 ENCOUNTER — Ambulatory Visit
Admission: RE | Admit: 2021-04-12 | Discharge: 2021-04-12 | Disposition: A | Discharge status: Home / Self Care | Payer: Medicare Other | Source: Ambulatory Visit | Attending: Family Medicine | Admitting: Family Medicine

## 2021-04-12 DIAGNOSIS — R928 Other abnormal and inconclusive findings on diagnostic imaging of breast: Secondary | ICD-10-CM

## 2021-04-12 DIAGNOSIS — N6314 Unspecified lump in the right breast, lower inner quadrant: Secondary | ICD-10-CM | POA: Diagnosis not present

## 2021-04-12 DIAGNOSIS — R922 Inconclusive mammogram: Secondary | ICD-10-CM | POA: Diagnosis not present

## 2021-04-12 DIAGNOSIS — N6312 Unspecified lump in the right breast, upper inner quadrant: Secondary | ICD-10-CM | POA: Diagnosis not present

## 2021-05-04 DIAGNOSIS — H2511 Age-related nuclear cataract, right eye: Secondary | ICD-10-CM | POA: Diagnosis not present

## 2021-05-04 DIAGNOSIS — H2512 Age-related nuclear cataract, left eye: Secondary | ICD-10-CM | POA: Diagnosis not present

## 2021-05-08 DIAGNOSIS — M5416 Radiculopathy, lumbar region: Secondary | ICD-10-CM | POA: Diagnosis not present

## 2021-05-08 DIAGNOSIS — M5116 Intervertebral disc disorders with radiculopathy, lumbar region: Secondary | ICD-10-CM | POA: Diagnosis not present

## 2021-05-18 DIAGNOSIS — H2513 Age-related nuclear cataract, bilateral: Secondary | ICD-10-CM | POA: Diagnosis not present

## 2021-05-18 DIAGNOSIS — H2512 Age-related nuclear cataract, left eye: Secondary | ICD-10-CM | POA: Diagnosis not present

## 2021-05-24 ENCOUNTER — Other Ambulatory Visit: Payer: Self-pay | Admitting: Cardiovascular Disease

## 2021-06-06 ENCOUNTER — Encounter: Payer: Self-pay | Admitting: Physician Assistant

## 2021-06-08 DIAGNOSIS — I10 Essential (primary) hypertension: Secondary | ICD-10-CM | POA: Diagnosis not present

## 2021-06-08 DIAGNOSIS — M4126 Other idiopathic scoliosis, lumbar region: Secondary | ICD-10-CM | POA: Diagnosis not present

## 2021-06-08 DIAGNOSIS — M5416 Radiculopathy, lumbar region: Secondary | ICD-10-CM | POA: Diagnosis not present

## 2021-06-12 ENCOUNTER — Encounter: Payer: Self-pay | Admitting: Cardiovascular Disease

## 2021-06-12 ENCOUNTER — Other Ambulatory Visit: Payer: Self-pay

## 2021-06-12 ENCOUNTER — Ambulatory Visit: Payer: Medicare Other | Admitting: Cardiovascular Disease

## 2021-06-12 VITALS — BP 112/63 | HR 58 | Ht 66.0 in | Wt 199.8 lb

## 2021-06-12 DIAGNOSIS — G4733 Obstructive sleep apnea (adult) (pediatric): Secondary | ICD-10-CM | POA: Diagnosis not present

## 2021-06-12 DIAGNOSIS — I10 Essential (primary) hypertension: Secondary | ICD-10-CM | POA: Diagnosis not present

## 2021-06-12 DIAGNOSIS — E669 Obesity, unspecified: Secondary | ICD-10-CM | POA: Diagnosis not present

## 2021-06-12 DIAGNOSIS — R7303 Prediabetes: Secondary | ICD-10-CM | POA: Diagnosis not present

## 2021-06-12 DIAGNOSIS — J45909 Unspecified asthma, uncomplicated: Secondary | ICD-10-CM | POA: Diagnosis not present

## 2021-06-12 NOTE — Progress Notes (Signed)
PCP: Dr. [name] Primary cardiologist: Dr. Sallyanne Kuster; Dr. Claiborne Billings (Sleep)   HPI: Charlotte [name] is a 70 y.o. female who presents for a one -year follow-up evaluation.    Ms. Ursin has a history of asthma, hypertension, hyperlipidemia, hypothyroidism, and had been followed by Dr. Sallyanne Kuster for cardiology care.  She was referred for a sleep study due to concerns of obstructive sleep apnea.  She underwent a split-night study on 04/01/2016.  This demonstrated severe obstructive sleep apnea with an AHI of 32.7 per hour.  She had a significant positional component with supine sleep AHI at 42.4 per hour.  She was unable to achieve any REM sleep on the baseline portion of the study.  CPAP titration was initiated titrated up to 11 cm water pressure.  During her study .  She had soft snoring.  There was a significantly abnormal arousal index area.  She was in sinus rhythm and had a rare P EC.  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  She was set up with CPAP on 05/03/2016. A download was obtained from 05/20/2016 through August 1 17.  She had only 27% of days of usage and only 17% of days greater than 4 hours of use.  She was only averaging 4 hours and 24 minutes.  On 11 cm water pressure, her AHI was 4.0, with an apnea index was 3.8 with a hypopnea index of 0.2.  Since she underwent shoulder surgery  she has been sleeping in a recliner since her surgery.  She has had pain in her shoulder which has been limiting her sleep.  When I saw her in August 2017 an Epworth sleepiness scale score was calculated  as shown below and this endorsed at 16 compatible with significant excessive daytime sleepiness.  Epworth Sleepiness Scale: Situation   Chance of Dozing/Sleeping (0 = never , 1 = slight chance , 2 = moderate chance , 3 = high chance )   sitting and reading 2   watching TV 2   sitting inactive in a public place 2   being a passenger in a motor  vehicle for an hour or more 2   lying down in the afternoon 2   sitting and talking to someone 2   sitting quietly after lunch (no alcohol) 2   while stopped for a few minutes in traffic as the driver 2   Total Score  16   She was evaluated by me in July 2019.  She had  been seen by Cristopher Peru, PsyD for a neurology evaluation there has been some issues with cognitive functioning.  During that evaluation it was recommended that with complaints of poor sleep the fact that she had not been using her CPAP therapy at this could be exacerbating underlying dementia.  She had stopped CPAP for some time.  During that evaluation stated that she would try to reinstitute therapy.  Download was obtained 1 month later which did show improve use but she was still not meeting compliance standards with 73% of usage days and only 60% of usage greater than 4 hours.  AHI was 3.2.  She was set on a total device with a range of 7-20 with 95th percentile pressure 11.6.   Last saw her June 2021 and since her prior evaluation is normal she needs she is now followed primarily by Dr. Serita Grammes and has not seen Dr. Recardo Evangelist. She has had issues with  increasing blood pressure and her lisinopril HCT dose was recently increased 6 months ago to 20/25 mg.  She is continue to take furosemide 20 mg daily.  I obtained a new download of her CPAP from May 29 through May 14, 2020 which again shows poor compliance with only 6 out of 30 days of usage.  Her AHI however is excellent when used at 0.6.  She continues to be on levothyroxine 50 mcg for hypothyroidism.  She is on simvastatin 40 mg for hyperlipidemia.  LDL cholesterol in April 2021 was 101.  During that evaluation, abnormal discussion with her regarding regarding the effects of untreated sleep apnea and her cardiovascular health.  Her blood pressure remained elevated and amlodipine 5 mg was added to her medical regimen.  Her LDL remains elevated despite being on  simvastatin I suggested she change to rosuvastatin 20 mg a more aggressive in therapy.  Over the past year, she has done well.  She has consistently used her CPAP therapy but now admits that she feels so much better.  She is unaware of any breakthrough snoring.  An Epworth Sleepiness Scale score was calculated in the office today and this endorsed at 7 arguing against residual daytime sleepiness.  Her CPAP machine set up date was May 03, 2016.  Her machine is 3G and not 5G compatible and as result we were unable to obtain any wireless download data after April of this year when did not work now only allows 5G compatibility.  However a download was obtained from March 1 through February 16, 2021 which confirms excellent compliance.  She is averaging 7 hours and 52 minutes of CPAP use per night.  Air sense 10 AutoSet unit is set at a minimum pressure of 6 with potential maximum of 20 cm.  Her 95th percentile pressure is 10.6 with a maximum average pressure of 11.6.  AHI is excellent at 0.8.  Since I added amlodipine last year, her blood pressure has improved.  In addition with the change to rosuvastatin, lipid studies significantly improved with most recent LDL cholesterol at 64 when drawn by Dr. Serita Grammes on Mar 23, 2021.  She presents for yearly evaluation.   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 04/01/2017   Chest pain at rest 01/27/2016   Chronic diastolic heart failure ([place]) 04/22/2016   Depression    Essential hypertension 01/27/2016   Exertional dyspnea 01/27/2016   GERD (gastroesophageal reflux disease)    HNP (herniated nucleus pulposus) 03/2017   HNP (herniated nucleus pulposus) with myelopathy, cervical 04/01/2017   Hyperlipidemia 01/27/2016   Hypersomnolence 01/27/2016   Hypothyroidism 01/27/2016   Murmur, cardiac 01/27/2016   OSA (obstructive sleep apnea) 04/22/2016   Pericardial effusion  01/27/2016   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   Shortness of breath dyspnea    relating to asthma   Sleep apnea    Syncope 01/27/2016    Past Surgical History:  Procedure Laterality Date   ABDOMINAL HYSTERECTOMY     BREAST REDUCTION SURGERY  2011   CERVICAL DISC ARTHROPLASTY N/A 04/01/2017   Procedure: CERVICAL ANTERIOR [place] ARTHROPLASTY C5-C6;  Surgeon: Kristeen Miss, MD;  Location: [facility] [place] [facility];  Service: Neurosurgery;  Laterality: N/A;   COLONOSCOPY     ESOPHAGOGASTRODUODENOSCOPY     REDUCTION MAMMAPLASTY     REVERSE SHOULDER ARTHROPLASTY Right 08/02/2016   Procedure: REVERSE SHOULDER ARTHROPLASTY;  Surgeon: Richardson Landry  Veverly Fells, MD;  Location: [place] [facility];  Service: Orthopedics;  Laterality: Right;    Allergies  Allergen Reactions   No Known Allergies     Current Outpatient Medications  Medication Sig Dispense Refill   albuterol (PROVENTIL HFA;VENTOLIN HFA) 108 (90 Base) MCG/ACT inhaler Inhale 2 puffs into the lungs every 6 (six) hours as needed for wheezing or shortness of breath.      amLODipine (NORVASC) 5 MG tablet TAKE 1 TABLET BY MOUTH  DAILY 90 tablet 0   aspirin 81 MG tablet Take 81 mg by mouth daily.     donepezil (ARICEPT) 10 MG tablet Take 10 mg by mouth daily.     furosemide (LASIX) 20 MG tablet Take 1 tablet (20 mg total) by mouth daily. NEED OV. 30 tablet 0   levothyroxine (SYNTHROID, LEVOTHROID) 50 MCG tablet Take 50 mcg by mouth daily.      lisinopril-hydrochlorothiazide (PRINZIDE,ZESTORETIC) 10-12.5 MG tablet Take 2 tablets by mouth daily.      LORazepam (ATIVAN) 1 MG tablet      rosuvastatin (CRESTOR) 20 MG tablet Take 1 tablet (20 mg total) by mouth daily. KEEP OV. 90 tablet 0   sertraline (ZOLOFT) 100 MG tablet Take 100 mg by mouth daily.     TRELEGY ELLIPTA 100-62.5-25 MCG/INH AEPB Inhale 1 puff into the lungs daily.     No current facility-administered medications for this visit.    Social History   Socioeconomic History   Marital status: Widowed    Spouse name: Not  on file   Number of children: Not on file   Years of education: Not on file   Highest education level: Not on file  Occupational History   Not on file  Tobacco Use   Smoking status: Never   Smokeless tobacco: Never  Vaping Use   Vaping Use: Never used  Substance and Sexual Activity   Alcohol use: No    Alcohol/week: 0.0 standard drinks   Drug use: No   Sexual activity: Not on file  Other Topics Concern   Not on file  Social History Narrative   Epworth Sleepiness Scale = 17 (as of 02/16/2016)   Social Determinants of Health   Financial Resource Strain: Not on file  Food Insecurity: Not on file  Transportation Needs: Not on file  Physical Activity: Not on file  Stress: Not on file  Social Connections: Not on file  Intimate Partner Violence: Not on file    Family History  Problem Relation Age of Onset   Heart failure Mother    Diabetes Mother    Hypertension Mother    Hyperlipidemia Mother    Lung cancer Father    Diabetes Father    Hypertension Father    Hypertension Sister    Hyperlipidemia Sister    Hyperthyroidism Sister    Lung cancer Brother    Rheum arthritis Child    Lung cancer Child    Dementia Maternal Grandmother    Heart disease Maternal Grandfather    Lung cancer Paternal Grandfather    Diabetes Brother    Hyperlipidemia Brother    Hyperlipidemia Brother    Dementia Maternal Aunt      ROS General: Negative; No fevers, chills, or night sweats HEENT: Negative; No changes in vision or hearing, sinus congestion, difficulty swallowing Pulmonary: Positive for asthma Cardiovascular: Negative; No chest pain, presyncope, syncope, palpatations GI: Negative; No nausea, vomiting, diarrhea, or abdominal pain GU: Negative; No dysuria, hematuria, or difficulty voiding Musculoskeletal: Low back discomfort at L4  and 5 Hematologic: Negative; no easy bruising, bleeding Endocrine: Positive for hypothyroidism. Neuro: Intermittent sciatica down her left  leg Skin: Negative; No rashes or skin lesions Psychiatric: Negative; No behavioral problems, depression Sleep: Positive for OSA.  On CPAP with poor compliance.  Positive for daytime sleepiness, and hypersomnolence; no bruxism, restless legs, hypnogognic hallucinations, no cataplexy   Physical Exam BP 112/63   Pulse (!) 58   Ht '5\' 6"'$  (1.676 m)   Wt 199 lb 12.8 oz (90.6 kg)   SpO2 99%   BMI 32.25 kg/m    Repeat blood pressure when taken by me was excellent at 114/60   Wt Readings from Last 3 Encounters:  06/12/21 199 lb 12.8 oz (90.6 kg)  05/15/20 207 lb (93.9 kg)  06/01/18 205 lb 12.8 oz (93.4 kg)   General: Alert, oriented, no distress.  Skin: normal turgor, no rashes, warm and dry HEENT: Normocephalic, atraumatic. Pupils equal round and reactive to light; sclera anicteric; extraocular muscles intact;  Nose without nasal septal hypertrophy Mouth/Parynx benign; Mallinpatti scale 3 Neck: No JVD, no carotid bruits; normal carotid upstroke Lungs: clear to ausculatation and percussion; no wheezing or rales Chest wall: without tenderness to palpitation Heart: PMI not displaced, RRR, s1 s2 normal, 2/6 systolic murmur, no diastolic murmur, no rubs, gallops, thrills, or heaves Abdomen: soft, nontender; no hepatosplenomehaly, BS+; abdominal aorta nontender and not dilated by palpation. Back: no CVA tenderness Pulses 2+ Musculoskeletal: full range of motion, normal strength, no joint deformities Extremities: no clubbing cyanosis or edema, Homan's sign negative  Neurologic: grossly nonfocal; Cranial nerves grossly wnl Psychologic: Normal mood and affect  ECG (independently read by me): Sinus bradycardia 58 bpm with isolated PAC.  No ST segment changes.  Normal intervals.  June 2021 ECG (independently read by me): Sinus bradycardia 59 bpm.  No ectopy.  Normal intervals.  July 2019 ECG (independently read by me): Normal sinus rhythm at 64 bpm.  Baseline wander.  No ectopy.  Normal  intervals.  ECG (independently read by me): Not done today, but the ECG from 04/22/2016 was reviewed which showed sinus rhythm with inferolateral ST changes  LABS:  BMP Latest Ref Rng & Units 04/01/2017 08/03/2016 07/25/2016  Glucose 65 - 99 mg/dL 96 132(H) 105(H)  BUN 6 - 20 mg/dL '14 15 9  '$ Creatinine 0.44 - 1.00 mg/dL 0.81 0.97 0.86  Sodium 135 - 145 mmol/L 140 138 141  Potassium 3.5 - 5.1 mmol/L 3.7 3.8 4.1  Chloride 101 - 111 mmol/L 108 103 108  CO2 22 - 32 mmol/L '25 27 25  '$ Calcium 8.9 - 10.3 mg/dL 9.3 8.9 9.2     No flowsheet data found.   CBC Latest Ref Rng & Units 04/01/2017 08/03/2016 07/25/2016  WBC 4.0 - 10.5 K/uL 6.6 - 5.7  Hemoglobin 12.0 - 15.0 g/dL 12.7 11.5(L) 11.7(L)  Hematocrit 36.0 - 46.0 % 37.9 36.0 36.4  Platelets 150 - 400 K/uL 167 - 182     Lipid Panel  No results found for: CHOL, TRIG, HDL, CHOLHDL, VLDL, LDLCALC, LDLDIRECT   RADIOLOGY: No results found.  IMPRESSION: 1. OSA (obstructive sleep apnea)   2. Essential hypertension   3. Mild asthma without complication, unspecified whether persistent   4. Mild obesity   5. Borderline diabetes      ASSESSMENT AND PLAN: Ms. Danashia Bohley is a 23 -year-old female who has a history of hypertension, asthma, hypothyroidism, hyperlipidemia, and was found to have severe obstructive sleep apnea on her sleep study from May 2017.  On the diagnostic portion of the study, her AHI was 32.7 per hour but her RDI was significantly increased at 67.8 per hour.  She was unable to achieve any REM sleep.  She was titrated up to 11 cm water pressure with excellent benefit.  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  When I last saw her in 2019 she had not used CPAP for several years.  At that time I had lengthy discussion with her and she did initially reinitiate therapy with subsequent download 1 month later.  However over the past several years she states that she again has become lazy and has  not used treatment insistently.  When I last saw her in June 2021, she was not compliant.  I had a lengthy discussion with her specifically DF discussed the effects that untreated sleep apnea may have on her elevated blood pressure, potential for nocturnal arrhythmias, glucose, as well as potential ischemia and GERD.  Fortunately, she heated my advice and subsequently she began to use CPAP with regularity.  Her compliance is excellent.  Most recent download through the beginning of April showed an AHI of 0.8 and her 95th percentile pressure was 10.6 with maximum average pressure 11.6.  Her CPAP machine is 3G and since April 2022: Only 5G units are we able to obtain wireless information.  She is compliant.  Her machine is over 28 years old.  I discussed with her that she would qualify for a new machine and that there is at least a 4 to 23-monthwaiting period presently due to supply chain issues.  For this reason, we will notify [facility] her DME company so that she can be put on a wait list and when a new machine becomes available later this year perhaps she may be able to obtain one.  If she continues with her old machine, I will see her in 1 year.  However if she does get a new machine I will need to see her within 90 days of her new set up date.  Presently, her blood pressure today is excellent on her regimen consisting of amlodipine 5 mg, furosemide 20 mg, and lisinopril HCT 10/12.5 mg daily.  Lipid studies are markedly improved on rosuvastatin 20 mg daily with LDL at 64.  Her asthma is controlled with trilogy Ellipta and as needed albuterol.  Recent laboratory has revealed she is borderline diabetic which is being followed closely by Dr. SBrigitte Pulse  Her hemoglobin A1c was 6.5 with a random glucose at 140.  She has made significant dietary adjustments since her lab was done.  She is mildly obese with a body mass index of 32.25.  Weight loss and increased exercise was recommended.  She will follow-up with Dr.  KSerita Grammes  I will see her in 1 year or sooner as noted above   TTroy Sine MD, [facility] 06/12/2021 8:49 AM

## 2021-06-12 NOTE — Progress Notes (Addendum)
Assessment/Plan:   Charlotte [name] is a 70 y.o. year old female with risk factors including  age, hypertension, hyperlipidemia,  hypothyroidism, OSA on CPAP, insomnia, dCHF, anxiety, depression and  seen today for evaluation of memory loss. MoCA today is 17/30 (equivalent to MMSE 23/30) with deficiencies in visuospatial/executive, memory, language, abstraction, delayed recall  0/5; orientation  6/6 .  In 2019 she had a neurocognitive exam, yielding mild dementia, rule out Alzheimer's disease, currently on Aricept 10 mg daily, tolerating it well.   Recommendations:   Mild Dementia likely due to Alzheimer's disease  MRI brain with/without contrast to assess for underlying structural abnormality and assess vascular load  Repeat Neurocognitive testing to assess trajectory over time and potential contribution from mood disorder Check B12 Continue Donepezil 10 mg daily, side effects discussed Discussed safety both in and out of the home.  Discussed the importance of regular daily schedule with inclusion of crossword puzzles to maintain brain function.  Continue to monitor mood with PCP.  Stay active at least 30 minutes at least 3 times a week.  Naps should be scheduled and should be no longer than 60 minutes and should not occur after 2 PM.  Mediterranean diet is recommended  Continue to monitor mild tremors during the next visit. Folllow up once results above are available   Subjective:   The patient is seen in neurologic consultation at the request of Charlotte Neer, MD for the evaluation of memory.  The patient is here alone.  She is a 70 year old woman who had memory issues for about 3 years.  In 2/ 2019, the patient was seen by neuropsychology, Charlotte [name] and after extensive work-up, she was diagnosed with mild dementia, rule out Alzheimer's disease.  She was placed on Aricept 10 mg daily, and she is tolerating it well.  In addition, other issues contributed to her memory loss  were mild major depressive disorder, and moderate generalized anxiety disorder. At the time, she was forgetting appointments, including jury duty twice in the Fall of 2018 even after being reminded on the same day.  She also was forgetting details of recent conversations, forgetting recent events, repeating questions and statements, misplacing and losing things, word finding difficulty, and occasional difficulty following a conversation or comprehending what is being told to her.  She was not appearing to have any significant difficulty with attention and concentration, or visual spatial navigation. Psychiatry was recommended but patient declined follow up.  During a visit to her PCP on 06/04/2021, she reported being "a little more forgetful ".  She was referred for further evaluation.  Although "Aricept helps, it feels that it is taking me a little longer to get the words out ".  She gets to her room, and does not remember what she was coming there for.  The patient lives alone, but her sister lives next door, and monitors any changes.  She has been dealing with some depression, but this is not worse than prior.  She denies irritability.  Her sleep is poor because she has to "get up a lot at night to go to the bathroom and wear a CPAP so uncomfortable". She does report vivid dreams, she calls them "crazy dreams ", and at times she acts them out.  She denies sleepwalking, although her dead husband used to tell her that she used to sleepwalk at times.  Denies hallucinations or paranoia.  She denies any issues with bathing and dressing.  Occasionally she forgets to take her medication.  She takes care of her own finances, without forgetting to pay any bills.  She denies leaving objects in unusual places.  Her appetite is good, denies trouble swallowing, or sialorrhea.  She cooks, and denies leaving the stove or the faucet on, or forgetting common recipes.  She ambulates without difficulty without a walker or a  cane.  She denies any falls.  She has a history of right leg neuropathy, followed by Charlotte [name], otherwise no focal numbness or tingling, unilateral weakness, but she does have mild tremors, which she reports having them for many years, and they have not gotten worse.  She does not yet drop any objects or have any trouble writing or utilizing utensils.  She continues to drive without getting lost, she uses frequently the GPS.  She denies any headaches, or falls.  She denies any injuries to the head, double vision or dizziness.  Denies urine incontinence during the day.  She denies constipation or diarrhea.  Denies anosmia.  Denies any history of alcohol or tobacco.  Family history significant for dementia in maternal grandmother and 1 maternal aunt.  She is retired from the [employer] since 2013.  She is widowed since 2010.  She has 3 daughters and 5 grandchildren.  Labs 06/04/21:  A1C 6.5,  Lipid panel normal, TSH 2.29 nl,  CMP normal    MRI brain 03/21/18 w and wo contrast  1. Normal MRI appearance of the brain for age. 2. 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  Allergies  Allergen Reactions   No Known Allergies     Current Outpatient Medications  Medication Instructions   albuterol (PROVENTIL HFA;VENTOLIN HFA) 108 (90 Base) MCG/ACT inhaler 2 puffs, Inhalation, Every 6 hours PRN   amLODipine (NORVASC) 5 MG tablet TAKE 1 TABLET BY MOUTH  DAILY   aspirin 81 mg, Oral, Daily   donepezil (ARICEPT) 10 mg, Oral, Daily   furosemide (LASIX) 20 mg, Oral, Daily, NEED OV.   levothyroxine (SYNTHROID) 50 mcg, Oral, Daily   lisinopril-hydrochlorothiazide (PRINZIDE,ZESTORETIC) 10-12.5 MG tablet 2 tablets, Oral, Daily   rOPINIRole (REQUIP) 0.5 mg, Oral, Daily at bedtime   rosuvastatin (CRESTOR) 20 mg, Oral, Daily, KEEP OV.   sertraline (ZOLOFT) 100 mg, Oral, Daily   TRELEGY ELLIPTA 100-62.5-25 MCG/INH AEPB 1 puff, Inhalation, Daily     VITALS:   Vitals:   06/13/21  0727  BP: 111/71  Pulse: 63  SpO2: 96%  Weight: 201 lb 3.2 oz (91.3 kg)  Height: '5\' 6"'$  (1.676 m)   No flowsheet data found.  PHYSICAL EXAM   HEENT:  Normocephalic, atraumatic. The mucous membranes are moist. The superficial temporal arteries are without ropiness or tenderness. Cardiovascular: Regular rate and rhythm. Lungs: Clear to auscultation bilaterally. Neck: There are no carotid bruits noted bilaterally.  NEUROLOGICAL: [place] Cognitive Assessment  06/13/2021  Visuospatial/ Executive (0/5) 3  Naming (0/3) 3  Attention: Read list of digits (0/2) 2  Attention: Read list of letters (0/1) 1  Attention: Serial 7 subtraction starting at 100 (0/3) 0  Language: Repeat phrase (0/2) 1  Language : Fluency (0/1) 0  Abstraction (0/2) 1  Delayed Recall (0/5) 0  Orientation (0/6) 6  Total 17  Adjusted Score (based on education) 17   No flowsheet data found.  No flowsheet data found.   Orientation:  Alert and oriented to person, place and time. No aphasia or dysarthria. Fund of knowledge is appropriate. Recent memory impaired and remote memory intact.  Attention and  concentration are normal.  Able to name objects and repeat phrases. Delayed recall 0 /5 Cranial nerves: There is good facial symmetry. Extraocular muscles are intact and visual fields are full to confrontational testing. Speech is fluent and clear. Soft palate rises symmetrically and there is no tongue deviation. Hearing is intact to conversational tone. Tone: Tone is good throughout. Tremors: she has mild L>R resting tremors. No head tremors. No glabellar reflex. No tongue tremors.  Sensation: Sensation is intact to light touch and pinprick throughout. Vibration is intact at the bilateral big toe.There is no extinction with double simultaneous stimulation. There is no sensory dermatomal level identified. Coordination: The patient has no difficulty with RAM's or FNF bilaterally. Normal finger to nose. No dysdiadochokinesia.   Motor: Strength is 5/5 in the bilateral upper and lower extremities. There is no pronator drift. There are no fasciculations noted. DTR's: Deep tendon reflexes are 2/4 at the bilateral biceps, triceps, brachioradialis, patella and achilles.  Plantar responses are downgoing bilaterally. Gait and Station: The patient is able to ambulate without difficulty.The patient is able to heel toe walk without any difficulty. No short stride. The patient is able to ambulate in a tandem fashion, but reduced arm swing . The patient is able to stand in the Romberg position.     Thank you for allowing [place] the opportunity to participate in the care of this nice patient. Please do not hesitate to contact us for any questions or concerns.   Total time spent on today's visit was 45 minutes, including both face-to-face time and nonface-to-face time.  Time included that spent on review of records (prior notes available to me/labs/imaging if pertinent), discussing treatment and goals, answering patient's questions and coordinating care.  Cc:  Charlotte Neer, MD  Sharene Butters 06/13/2021 8:27 AM

## 2021-06-12 NOTE — Patient Instructions (Signed)
Medication Instructions:  Your physician recommends that you continue on your current medications as directed. Please refer to the Current Medication list given to you today.  *If you need a refill on your cardiac medications before your next appointment, please call your pharmacy*   Lab Work: None ordered.    Testing/Procedures: None ordered.    Follow-Up: At [facility], you and your health needs are our priority.  As part of our continuing mission to provide you with exceptional heart care, we have created designated Provider Care Teams.  These Care Teams include your primary Cardiologist (physician) and Advanced Practice Providers (APPs -  Physician Assistants and Nurse Practitioners) who all work together to provide you with the care you need, when you need it.  We recommend signing up for the patient portal called "MyChart".  Sign up information is provided on this After Visit Summary.  MyChart is used to connect with patients for Virtual Visits (Telemedicine).  Patients are able to view lab/test results, encounter notes, upcoming appointments, etc.  Non-urgent messages can be sent to your provider as well.   To learn more about what you can do with MyChart, go to [email or web address].    Your next appointment:   Please plan to see Dr. Claiborne Billings within 3 months of getting your new CPAP.   The format for your next appointment:   In Person  Provider:   [name], MD

## 2021-06-13 ENCOUNTER — Other Ambulatory Visit (INDEPENDENT_AMBULATORY_CARE_PROVIDER_SITE_OTHER): Payer: Medicare Other

## 2021-06-13 ENCOUNTER — Ambulatory Visit: Payer: Medicare Other | Admitting: Physician Assistant

## 2021-06-13 ENCOUNTER — Encounter: Payer: Self-pay | Admitting: Physician Assistant

## 2021-06-13 VITALS — BP 111/71 | HR 63 | Ht 66.0 in | Wt 201.2 lb

## 2021-06-13 DIAGNOSIS — R413 Other amnesia: Secondary | ICD-10-CM

## 2021-06-13 DIAGNOSIS — F0390 Unspecified dementia without behavioral disturbance: Secondary | ICD-10-CM | POA: Diagnosis not present

## 2021-06-13 DIAGNOSIS — F03A0 Unspecified dementia, mild, without behavioral disturbance, psychotic disturbance, mood disturbance, and anxiety: Secondary | ICD-10-CM

## 2021-06-13 LAB — VITAMIN B12: Vitamin B-12: 286 pg/mL (ref 211–911)

## 2021-06-13 NOTE — Patient Instructions (Signed)
It was a pleasure to see you today at our office.   Recommendations:  Neurocognitive evaluation at our office MRI of the brain, the office  of MRI will call you to arrange you appointment Check B12  at the lab Follow up once the results of the above are available   RECOMMENDATIONS FOR ALL PATIENTS WITH MEMORY PROBLEMS: 1. Continue to exercise (Recommend 30 minutes of walking everyday, or 3 hours every week) 2. Increase social interactions - continue going to [place] and enjoy social gatherings with friends and family 3. Eat healthy, avoid fried foods and eat more fruits and vegetables 4. Maintain adequate blood pressure, blood sugar, and blood cholesterol level. Reducing the risk of stroke and cardiovascular disease also helps promoting better memory. 5. Avoid stressful situations. Live a simple life and avoid aggravations. Organize your time and prepare for the next day in anticipation. 6. Sleep well, avoid any interruptions of sleep and avoid any distractions in the bedroom that may interfere with adequate sleep quality 7. Avoid sugar, avoid sweets as there is a strong link between excessive sugar intake, diabetes, and cognitive impairment We discussed the Mediterranean diet, which has been shown to help patients reduce the risk of progressive memory disorders and reduces cardiovascular risk. This includes eating fish, eat fruits and green leafy vegetables, nuts like almonds and hazelnuts, walnuts, and also use olive oil. Avoid fast foods and fried foods as much as possible. Avoid sweets and sugar as sugar use has been linked to worsening of memory function.  There is always a concern of gradual progression of memory problems. If this is the case, then we may need to adjust level of care according to patient needs. Support, both to the patient and caregiver, should then be put into place.      You have been referred for a neuropsychological evaluation (i.e., evaluation of memory and thinking  abilities). Please bring someone with you to this appointment if possible, as it is helpful for the doctor to hear from both you and another adult who knows you well. Please bring eyeglasses and hearing aids if you wear them.    The evaluation will take approximately 3 hours and has two parts:   The first part is a clinical interview with the neuropsychologist (Dr. Melvyn Novas or Dr. Nicole Kindred). During the interview, the neuropsychologist will speak with you and the individual you brought to the appointment.    The second part of the evaluation is testing with the doctor's technician Hinton Dyer or Maudie Mercury). During the testing, the technician will ask you to remember different types of material, solve problems, and answer some questionnaires. Your family member will not be present for this portion of the evaluation.   Please note: We must reserve several hours of the neuropsychologist's time and the psychometrician's time for your evaluation appointment. As such, there is a No-Show fee of $100. If you are unable to attend any of your appointments, please contact our office as soon as possible to reschedule.    FALL PRECAUTIONS: Be cautious when walking. Scan the area for obstacles that may increase the risk of trips and falls. When getting up in the mornings, sit up at the edge of the bed for a few minutes before getting out of bed. Consider elevating the bed at the head end to avoid drop of blood pressure when getting up. Walk always in a well-lit room (use night lights in the walls). Avoid area rugs or power cords from appliances in the middle of  the walkways. Use a walker or a cane if necessary and consider physical therapy for balance exercise. Get your eyesight checked regularly.  FINANCIAL OVERSIGHT: Supervision, especially oversight when making financial decisions or transactions is also recommended.  HOME SAFETY: Consider the safety of the kitchen when operating appliances like stoves, microwave oven, and  blender. Consider having supervision and share cooking responsibilities until no longer able to participate in those. Accidents with firearms and other hazards in the house should be identified and addressed as well.   ABILITY TO BE LEFT ALONE: If patient is unable to contact 911 operator, consider using LifeLine, or when the need is there, arrange for someone to stay with patients. Smoking is a fire hazard, consider supervision or cessation. Risk of wandering should be assessed by caregiver and if detected at any point, supervision and safe proof recommendations should be instituted.  MEDICATION SUPERVISION: Inability to self-administer medication needs to be constantly addressed. Implement a mechanism to ensure safe administration of the medications.   DRIVING: Regarding driving, in patients with progressive memory problems, driving will be impaired. We advise to have someone else do the driving if trouble finding directions or if minor accidents are reported. Independent driving assessment is available to determine safety of driving.   If you are interested in the driving assessment, you can contact the following:  [employer] in [place]  [place] [phone] or [phone]    [place] refers to food and lifestyle choices that are based on the traditions of countries located on the [employer]. This way of eating has been shown to help prevent certain conditions and improve outcomes for people who have chronic diseases, like kidney disease and heart disease. What are tips for following this plan? Lifestyle  Cook and eat meals together with your family, when possible. Drink enough fluid to keep your urine clear or pale yellow. Be physically active every day. This includes: Aerobic exercise like running or swimming. Leisure  activities like gardening, walking, or housework. Get 7-8 hours of sleep each night. If recommended by your health care provider, drink red wine in moderation. This means 1 glass a day for nonpregnant women and 2 glasses a day for men. A glass of wine equals 5 oz (150 mL). Reading food labels  Check the serving size of packaged foods. For foods such as rice and pasta, the serving size refers to the amount of cooked product, not dry. Check the total fat in packaged foods. Avoid foods that have saturated fat or trans fats. Check the ingredients list for added sugars, such as corn syrup. Shopping  At the grocery store, buy most of your food from the areas near the walls of the store. This includes: Fresh fruits and vegetables (produce). Grains, beans, nuts, and seeds. Some of these may be available in unpackaged forms or large amounts (in bulk). Fresh seafood. Poultry and eggs. Low-fat dairy products. Buy whole ingredients instead of prepackaged foods. Buy fresh fruits and vegetables in-season from local farmers markets. Buy frozen fruits and vegetables in resealable bags. If you do not have access to quality fresh seafood, buy precooked frozen shrimp or canned fish, such as tuna, salmon, or sardines. Buy small amounts of raw or cooked vegetables, salads, or olives from the deli or salad bar at your store. Stock your pantry so you always have certain foods on hand, such as olive oil, canned tuna, canned  tomatoes, rice, pasta, and beans. Cooking  Cook foods with extra-virgin olive oil instead of using butter or other vegetable oils. Have meat as a side dish, and have vegetables or grains as your main dish. This means having meat in small portions or adding small amounts of meat to foods like pasta or stew. Use beans or vegetables instead of meat in common dishes like chili or lasagna. Experiment with different cooking methods. Try roasting or broiling vegetables instead of steaming or sauteing  them. Add frozen vegetables to soups, stews, pasta, or rice. Add nuts or seeds for added healthy fat at each meal. You can add these to yogurt, salads, or vegetable dishes. Marinate fish or vegetables using olive oil, lemon juice, garlic, and fresh herbs. Meal planning  Plan to eat 1 vegetarian meal one day each week. Try to work up to 2 vegetarian meals, if possible. Eat seafood 2 or more times a week. Have healthy snacks readily available, such as: Vegetable sticks with hummus. Greek yogurt. Fruit and nut trail mix. Eat balanced meals throughout the week. This includes: Fruit: 2-3 servings a day Vegetables: 4-5 servings a day Low-fat dairy: 2 servings a day Fish, poultry, or lean meat: 1 serving a day Beans and legumes: 2 or more servings a week Nuts and seeds: 1-2 servings a day Whole grains: 6-8 servings a day Extra-virgin olive oil: 3-4 servings a day Limit red meat and sweets to only a few servings a month What are my food choices? Mediterranean diet Recommended Grains: Whole-grain pasta. Brown rice. Bulgar wheat. Polenta. Couscous. Whole-wheat bread. [place]. Vegetables: Artichokes. Beets. Broccoli. Cabbage. Carrots. Eggplant. Green beans. Chard. Kale. Spinach. Onions. Leeks. Peas. Squash. Tomatoes. Peppers. Radishes. Fruits: Apples. Apricots. Avocado. Berries. Bananas. Cherries. Dates. Figs. Grapes. Lemons. Melon. Oranges. Peaches. Plums. Pomegranate. Meats and other protein foods: Beans. Almonds. Sunflower seeds. Pine nuts. Peanuts. [place]. Salmon. Scallops. Shrimp. [place]. Tilapia. Clams. Oysters. Eggs. Dairy: Low-fat milk. Cheese. Greek yogurt. Beverages: Water. Red wine. Herbal tea. Fats and oils: Extra virgin olive oil. Avocado oil. Grape seed oil. Sweets and desserts: [place] yogurt with honey. Baked apples. Poached pears. Trail mix. Seasoning and other foods: Basil. Cilantro. Coriander. Cumin. Mint. Parsley. Sage. Rosemary. Tarragon. Garlic. Oregano. Thyme. Pepper.  Balsalmic vinegar. Tahini. Hummus. Tomato sauce. Olives. Mushrooms. Limit these Grains: Prepackaged pasta or rice dishes. Prepackaged cereal with added sugar. Vegetables: Deep fried potatoes (french fries). Fruits: Fruit canned in syrup. Meats and other protein foods: Beef. Pork. Lamb. Poultry with skin. Hot dogs. [place] Salines. Dairy: Ice cream. Sour cream. Whole milk. Beverages: Juice. Sugar-sweetened soft drinks. Beer. Liquor and spirits. Fats and oils: Butter. Canola oil. Vegetable oil. Beef fat (tallow). Lard. Sweets and desserts: Cookies. Cakes. Pies. Candy. Seasoning and other foods: Mayonnaise. Premade sauces and marinades. The items listed may not be a complete list. Talk with your dietitian about what dietary choices are right for you. Summary The Mediterranean diet includes both food and lifestyle choices. Eat a variety of fresh fruits and vegetables, beans, nuts, seeds, and whole grains. Limit the amount of red meat and sweets that you eat. Talk with your health care provider about whether it is safe for you to drink red wine in moderation. This means 1 glass a day for nonpregnant women and 2 glasses a day for men. A glass of wine equals 5 oz (150 mL). This information is not intended to replace advice given to you by your health care provider. Make sure you discuss any questions you have with your  health care provider. Document Released: 06/27/2016 Document Revised: 07/30/2016 Document Reviewed: 06/27/2016 Elsevier Interactive Patient Education  2017 [employer].

## 2021-06-18 ENCOUNTER — Telehealth: Payer: Self-pay | Admitting: Physician Assistant

## 2021-06-18 ENCOUNTER — Other Ambulatory Visit (HOSPITAL_COMMUNITY): Payer: Self-pay | Admitting: Neurological Surgery

## 2021-06-18 ENCOUNTER — Other Ambulatory Visit: Payer: Self-pay | Admitting: Neurological Surgery

## 2021-06-18 ENCOUNTER — Telehealth: Payer: Self-pay

## 2021-06-18 DIAGNOSIS — M4126 Other idiopathic scoliosis, lumbar region: Secondary | ICD-10-CM

## 2021-06-18 NOTE — Telephone Encounter (Signed)
Patient advised of B12 to start. Voiced understanding.

## 2021-06-18 NOTE — Telephone Encounter (Signed)
-----   Message from Rondel Jumbo, PA-C sent at 06/13/2021 12:31 PM EDT ----- PLease inform patient that B12 is on the lower side at 286, would like to keep it between 400 and 1000. Start B12 , 1000 mcg a day as supplement and folow up with primary doctor, thanks

## 2021-06-18 NOTE — Telephone Encounter (Signed)
Patient called and left a message returning a call for her lab results.

## 2021-06-18 NOTE — Telephone Encounter (Signed)
Spoke with pt and informed her B12 is on the lower side at 286, would like to keep it between 400 and 1000.Start B12 , 1000 mcg a day as supplement and folow up with primary doctor

## 2021-06-21 DIAGNOSIS — R7303 Prediabetes: Secondary | ICD-10-CM | POA: Diagnosis not present

## 2021-06-28 ENCOUNTER — Other Ambulatory Visit: Payer: Self-pay

## 2021-06-28 ENCOUNTER — Ambulatory Visit
Admission: RE | Admit: 2021-06-28 | Discharge: 2021-06-28 | Disposition: A | Discharge status: Home / Self Care | Payer: Medicare Other | Source: Ambulatory Visit | Attending: Physician Assistant | Admitting: Physician Assistant

## 2021-06-28 DIAGNOSIS — R413 Other amnesia: Secondary | ICD-10-CM

## 2021-06-28 DIAGNOSIS — J329 Chronic sinusitis, unspecified: Secondary | ICD-10-CM | POA: Diagnosis not present

## 2021-06-28 DIAGNOSIS — I6782 Cerebral ischemia: Secondary | ICD-10-CM | POA: Diagnosis not present

## 2021-06-28 DIAGNOSIS — G319 Degenerative disease of nervous system, unspecified: Secondary | ICD-10-CM | POA: Diagnosis not present

## 2021-06-29 NOTE — Progress Notes (Signed)
Patient advised of MRI results, voiced understanding  

## 2021-07-16 ENCOUNTER — Other Ambulatory Visit: Payer: Self-pay

## 2021-07-16 ENCOUNTER — Other Ambulatory Visit (HOSPITAL_COMMUNITY): Payer: Self-pay | Admitting: Neurological Surgery

## 2021-07-16 ENCOUNTER — Ambulatory Visit (HOSPITAL_COMMUNITY)
Admission: RE | Admit: 2021-07-16 | Discharge: 2021-07-16 | Disposition: A | Discharge status: Home / Self Care | Payer: Medicare Other | Source: Ambulatory Visit | Attending: Neurological Surgery | Admitting: Neurological Surgery

## 2021-07-16 DIAGNOSIS — M4126 Other idiopathic scoliosis, lumbar region: Secondary | ICD-10-CM

## 2021-07-16 DIAGNOSIS — M4316 Spondylolisthesis, lumbar region: Secondary | ICD-10-CM | POA: Diagnosis not present

## 2021-07-16 DIAGNOSIS — M48062 Spinal stenosis, lumbar region with neurogenic claudication: Secondary | ICD-10-CM | POA: Diagnosis not present

## 2021-07-16 DIAGNOSIS — R531 Weakness: Secondary | ICD-10-CM | POA: Diagnosis not present

## 2021-07-16 DIAGNOSIS — M5116 Intervertebral disc disorders with radiculopathy, lumbar region: Secondary | ICD-10-CM | POA: Insufficient documentation

## 2021-07-16 DIAGNOSIS — M419 Scoliosis, unspecified: Secondary | ICD-10-CM | POA: Diagnosis not present

## 2021-07-16 DIAGNOSIS — M48061 Spinal stenosis, lumbar region without neurogenic claudication: Secondary | ICD-10-CM | POA: Diagnosis not present

## 2021-07-16 DIAGNOSIS — M5136 Other intervertebral disc degeneration, lumbar region: Secondary | ICD-10-CM | POA: Diagnosis not present

## 2021-07-16 MED ORDER — ONDANSETRON HCL 4 MG/2ML IJ SOLN: 4.0000 mg | Freq: Four times a day (QID) | INTRAMUSCULAR | Status: DC | PRN

## 2021-07-16 MED ORDER — HYDROCODONE-ACETAMINOPHEN 5-325 MG PO TABS
1.0000 | ORAL_TABLET | ORAL | Status: DC | PRN
  Administered 2021-07-16: 1 via ORAL
  Filled 2021-07-16: qty 1

## 2021-07-16 MED ORDER — DIAZEPAM 5 MG PO TABS
5.0000 mg | ORAL_TABLET | Freq: Once | ORAL | Status: AC
  Filled 2021-07-16: qty 1

## 2021-07-16 MED ORDER — DIAZEPAM 5 MG PO TABS
ORAL_TABLET | ORAL | Status: AC
  Administered 2021-07-16: 5 mg via ORAL
  Filled 2021-07-16: qty 1

## 2021-07-16 MED ORDER — IOHEXOL 180 MG/ML  SOLN
20.0000 mL | Freq: Once | INTRAMUSCULAR | Status: AC | PRN
  Administered 2021-07-16: 10 mL via INTRATHECAL

## 2021-07-16 MED ORDER — LIDOCAINE HCL (PF) 1 % IJ SOLN
5.0000 mL | Freq: Once | INTRAMUSCULAR | Status: AC
  Administered 2021-07-16: 5 mL via INTRADERMAL

## 2021-07-16 NOTE — Progress Notes (Signed)
Patient was given discharge instructions. She verbalized understanding. 

## 2021-07-16 NOTE — Procedures (Signed)
Charlotte [name] is a 70 year old individuals had significant left lumbar radiculopathy has been treated conservatively for a prolonged length of time.  She also has evolved degenerative scoliosis with the levels of L2 334 and 4 5 being the most severely affected.  There is concern that she has been progressing a significant stenosis along with this but has signs and symptoms of a left lumbar radiculopathy that has been unrelenting a myelogram is now being performed to better evaluate the mobility of her scoliosis in addition to the degrees of compression that she may have in various levels.  Pre op Dx: Degenerative scoliosis, lumbar stenosis, lumbar radiculopathy. Post op Dx: Same Procedure: Lumbar myelogram Surgeon: Mattea Seger Puncture level: L2-3 Fluid color: Clear colorless Injection: Isovue 180, 10 mL Findings: High-grade stenosis L3-L4 with degenerative scoliosis and retrolisthesis of L2-3 and L3-4.  Other evaluation with CT scanning.

## 2021-07-17 ENCOUNTER — Inpatient Hospital Stay: Admission: RE | Admit: 2021-07-17 | Payer: Medicare Other | Source: Ambulatory Visit

## 2021-07-19 ENCOUNTER — Other Ambulatory Visit: Payer: Self-pay | Admitting: Cardiovascular Disease

## 2021-07-25 DIAGNOSIS — M5416 Radiculopathy, lumbar region: Secondary | ICD-10-CM | POA: Diagnosis not present

## 2021-07-26 ENCOUNTER — Other Ambulatory Visit: Payer: Self-pay | Admitting: Neurological Surgery

## 2021-08-04 ENCOUNTER — Other Ambulatory Visit: Payer: Self-pay

## 2021-08-04 ENCOUNTER — Ambulatory Visit
Admission: RE | Admit: 2021-08-04 | Discharge: 2021-08-04 | Disposition: A | Discharge status: Home / Self Care | Payer: Medicare Other | Source: Ambulatory Visit | Attending: Family Medicine | Admitting: Family Medicine

## 2021-08-04 DIAGNOSIS — R928 Other abnormal and inconclusive findings on diagnostic imaging of breast: Secondary | ICD-10-CM

## 2021-08-04 DIAGNOSIS — R922 Inconclusive mammogram: Secondary | ICD-10-CM | POA: Diagnosis not present

## 2021-08-17 ENCOUNTER — Other Ambulatory Visit: Payer: Medicare Other

## 2021-08-17 NOTE — Pre-Procedure Instructions (Addendum)
Surgical Instructions    Your procedure is scheduled on Thursday, August 23, 2021 at 7:30 AM.  Report to [facility] Main Entrance "A" at 5:30 A.M., then check in with the Admitting office.  Call this number if you have problems the morning of surgery:  [phone]   If you have any questions prior to your surgery date call [phone]: Open Monday-Friday 8am-4pm    Remember:  Do not eat after midnight the night before your surgery  You may drink clear liquids until 4:30 AM the morning of your surgery.   Clear liquids allowed are: Water, Non-Citrus Juices (without pulp), Carbonated Beverages, Clear Tea, Black Coffee Only, and Gatorade    Take these medicines the morning of surgery with A SIP OF WATER:  amLODipine (NORVASC) levothyroxine (SYNTHROID, LEVOTHROID)  rosuvastatin (CRESTOR)  sertraline (ZOLOFT)   IF NEEDED: albuterol (PROVENTIL HFA;VENTOLIN HFA) - Bring with you the morning of surgery. traMADol Veatrice Bourbon)  Follow your surgeon's instructions on when to stop Aspirin.  If no instructions were given by your surgeon then you will need to call the office to get those instructions.      As of today, STOP taking any Aleve, Naproxen, Ibuprofen, Motrin, Advil, Goody's, BC's, all herbal medications, fish oil, and all vitamins.                     Do NOT Smoke (Tobacco/Vaping) or drink Alcohol 24 hours prior to your procedure.  If you use a CPAP at night, you may bring all equipment for your overnight stay.   Contacts, glasses, piercing's, hearing aid's, dentures or partials may not be worn into surgery, please bring cases for these belongings.    For patients admitted to the hospital, discharge time will be determined by your treatment team.   Patients discharged the day of surgery will not be allowed to drive home, and someone needs to stay with them for 24 hours.  NO VISITORS WILL BE ALLOWED IN PRE-OP WHERE PATIENTS GET READY FOR SURGERY.  ONLY 1 SUPPORT PERSON MAY BE  PRESENT IN THE WAITING ROOM WHILE YOU ARE IN SURGERY.  IF YOU ARE TO BE ADMITTED, ONCE YOU ARE IN YOUR ROOM YOU WILL BE ALLOWED TWO (2) VISITORS.  Minor children may have two parents present. Special consideration for safety and communication needs will be reviewed on a case by case basis.   Special instructions:   [place]- Preparing For Surgery  Before surgery, you can play an important role. Because skin is not sterile, your skin needs to be as free of germs as possible. You can reduce the number of germs on your skin by washing with CHG (chlorahexidine gluconate) Soap before surgery.  CHG is an antiseptic cleaner which kills germs and bonds with the skin to continue killing germs even after washing.    Oral Hygiene is also important to reduce your risk of infection.  Remember - BRUSH YOUR TEETH THE MORNING OF SURGERY WITH YOUR REGULAR TOOTHPASTE  Please do not use if you have an allergy to CHG or antibacterial soaps. If your skin becomes reddened/irritated stop using the CHG.  Do not shave (including legs and underarms) for at least 48 hours prior to first CHG shower. It is OK to shave your face.  Please follow these instructions carefully.   Shower the NIGHT BEFORE SURGERY and the MORNING OF SURGERY  If you chose to wash your hair, wash your hair first as usual with your normal shampoo.  After you  shampoo, rinse your hair and body thoroughly to remove the shampoo.  Use CHG Soap as you would any other liquid soap. You can apply CHG directly to the skin and wash gently with a scrungie or a clean washcloth.   Apply the CHG Soap to your body ONLY FROM THE NECK DOWN.  Do not use on open wounds or open sores. Avoid contact with your eyes, ears, mouth and genitals (private parts). Wash Face and genitals (private parts)  with your normal soap.   Wash thoroughly, paying special attention to the area where your surgery will be performed.  Thoroughly rinse your body with warm water from the  neck down.  DO NOT shower/wash with your normal soap after using and rinsing off the CHG Soap.  Pat yourself dry with a CLEAN TOWEL.  Wear CLEAN PAJAMAS to bed the night before surgery  Place CLEAN SHEETS on your bed the night before your surgery  DO NOT SLEEP WITH PETS.   Day of Surgery: Shower with CHG soap. Do not wear jewelry, make up, nail polish, gel polish, artificial nails, or any other type of covering on natural nails including finger and toenails. If patients have artificial nails, gel coating, etc. that need to be removed by a nail salon please have this removed prior to surgery. Surgery may need to be canceled/delayed if the surgeon/ anesthesia feels like the patient is unable to be adequately monitored. Do not wear lotions, powders, perfumes, or deodorant. Do not shave 48 hours prior to surgery. Do not bring valuables to the hospital. [facility] is not responsible for any belongings or valuables. Wear Clean/Comfortable clothing the morning of surgery Remember to brush your teeth WITH YOUR REGULAR TOOTHPASTE.   Please read over the following fact sheets that you were given.   3 days prior to your procedure or After your COVID test   You are not required to quarantine however you are required to wear a well-fitting mask when you are out and around people not in your household. If your mask becomes wet or soiled, replace with a new one.   Wash your hands often with soap and water for 20 seconds or clean your hands with an alcohol-based hand sanitizer that contains at least 60% alcohol.   Do not share personal items.   Notify your provider:  o if you are in close contact with someone who has COVID  o or if you develop a fever of 100.4 or greater, sneezing, cough, sore throat, shortness of breath or body aches.

## 2021-08-20 ENCOUNTER — Encounter (HOSPITAL_COMMUNITY)
Admission: RE | Admit: 2021-08-20 | Discharge: 2021-08-20 | Disposition: A | Discharge status: Home / Self Care | Payer: Medicare Other | Source: Ambulatory Visit | Attending: Neurological Surgery | Admitting: Neurological Surgery

## 2021-08-20 ENCOUNTER — Encounter (HOSPITAL_COMMUNITY): Payer: Self-pay

## 2021-08-20 ENCOUNTER — Other Ambulatory Visit: Payer: Self-pay

## 2021-08-20 DIAGNOSIS — M4317 Spondylolisthesis, lumbosacral region: Secondary | ICD-10-CM | POA: Diagnosis not present

## 2021-08-20 DIAGNOSIS — M5416 Radiculopathy, lumbar region: Secondary | ICD-10-CM | POA: Diagnosis not present

## 2021-08-20 DIAGNOSIS — Z01812 Encounter for preprocedural laboratory examination: Secondary | ICD-10-CM | POA: Insufficient documentation

## 2021-08-20 DIAGNOSIS — Z20822 Contact with and (suspected) exposure to covid-19: Secondary | ICD-10-CM | POA: Insufficient documentation

## 2021-08-20 DIAGNOSIS — Z9989 Dependence on other enabling machines and devices: Secondary | ICD-10-CM | POA: Diagnosis not present

## 2021-08-20 DIAGNOSIS — Z79890 Hormone replacement therapy (postmenopausal): Secondary | ICD-10-CM | POA: Diagnosis not present

## 2021-08-20 DIAGNOSIS — Z90710 Acquired absence of both cervix and uterus: Secondary | ICD-10-CM | POA: Diagnosis not present

## 2021-08-20 DIAGNOSIS — I5032 Chronic diastolic (congestive) heart failure: Secondary | ICD-10-CM | POA: Insufficient documentation

## 2021-08-20 DIAGNOSIS — M4726 Other spondylosis with radiculopathy, lumbar region: Secondary | ICD-10-CM | POA: Diagnosis not present

## 2021-08-20 DIAGNOSIS — M48062 Spinal stenosis, lumbar region with neurogenic claudication: Secondary | ICD-10-CM | POA: Diagnosis not present

## 2021-08-20 DIAGNOSIS — K219 Gastro-esophageal reflux disease without esophagitis: Secondary | ICD-10-CM | POA: Diagnosis not present

## 2021-08-20 DIAGNOSIS — I110 Hypertensive heart disease with heart failure: Secondary | ICD-10-CM | POA: Insufficient documentation

## 2021-08-20 DIAGNOSIS — Z8249 Family history of ischemic heart disease and other diseases of the circulatory system: Secondary | ICD-10-CM | POA: Diagnosis not present

## 2021-08-20 DIAGNOSIS — Z7982 Long term (current) use of aspirin: Secondary | ICD-10-CM | POA: Insufficient documentation

## 2021-08-20 DIAGNOSIS — M4326 Fusion of spine, lumbar region: Secondary | ICD-10-CM | POA: Diagnosis not present

## 2021-08-20 DIAGNOSIS — Z833 Family history of diabetes mellitus: Secondary | ICD-10-CM | POA: Diagnosis not present

## 2021-08-20 DIAGNOSIS — F419 Anxiety disorder, unspecified: Secondary | ICD-10-CM | POA: Diagnosis present

## 2021-08-20 DIAGNOSIS — D62 Acute posthemorrhagic anemia: Secondary | ICD-10-CM | POA: Diagnosis not present

## 2021-08-20 DIAGNOSIS — M4807 Spinal stenosis, lumbosacral region: Secondary | ICD-10-CM | POA: Diagnosis not present

## 2021-08-20 DIAGNOSIS — J45909 Unspecified asthma, uncomplicated: Secondary | ICD-10-CM | POA: Diagnosis not present

## 2021-08-20 DIAGNOSIS — E039 Hypothyroidism, unspecified: Secondary | ICD-10-CM | POA: Diagnosis not present

## 2021-08-20 DIAGNOSIS — Z7951 Long term (current) use of inhaled steroids: Secondary | ICD-10-CM | POA: Insufficient documentation

## 2021-08-20 DIAGNOSIS — Z981 Arthrodesis status: Secondary | ICD-10-CM | POA: Diagnosis not present

## 2021-08-20 DIAGNOSIS — G4733 Obstructive sleep apnea (adult) (pediatric): Secondary | ICD-10-CM | POA: Diagnosis not present

## 2021-08-20 DIAGNOSIS — I350 Nonrheumatic aortic (valve) stenosis: Secondary | ICD-10-CM | POA: Insufficient documentation

## 2021-08-20 DIAGNOSIS — F32A Depression, unspecified: Secondary | ICD-10-CM | POA: Diagnosis not present

## 2021-08-20 DIAGNOSIS — M4157 Other secondary scoliosis, lumbosacral region: Secondary | ICD-10-CM | POA: Diagnosis not present

## 2021-08-20 DIAGNOSIS — Z79899 Other long term (current) drug therapy: Secondary | ICD-10-CM | POA: Insufficient documentation

## 2021-08-20 DIAGNOSIS — E785 Hyperlipidemia, unspecified: Secondary | ICD-10-CM | POA: Diagnosis not present

## 2021-08-20 DIAGNOSIS — R531 Weakness: Secondary | ICD-10-CM | POA: Diagnosis present

## 2021-08-20 DIAGNOSIS — F0390 Unspecified dementia without behavioral disturbance: Secondary | ICD-10-CM | POA: Insufficient documentation

## 2021-08-20 DIAGNOSIS — M4727 Other spondylosis with radiculopathy, lumbosacral region: Secondary | ICD-10-CM | POA: Diagnosis not present

## 2021-08-20 DIAGNOSIS — Z801 Family history of malignant neoplasm of trachea, bronchus and lung: Secondary | ICD-10-CM | POA: Diagnosis not present

## 2021-08-20 HISTORY — DX: Nonrheumatic aortic (valve) stenosis: I35.0

## 2021-08-20 HISTORY — DX: Other specified postprocedural states: Z98.890

## 2021-08-20 HISTORY — DX: Nausea with vomiting, unspecified: R11.2

## 2021-08-20 HISTORY — DX: Prediabetes: R73.03

## 2021-08-20 HISTORY — DX: Unspecified dementia, unspecified severity, without behavioral disturbance, psychotic disturbance, mood disturbance, and anxiety: F03.90

## 2021-08-20 LAB — BASIC METABOLIC PANEL
Anion gap: 10 (ref 5–15)
BUN: 25 mg/dL — ABNORMAL HIGH (ref 8–23)
CO2: 27 mmol/L (ref 22–32)
Calcium: 9.4 mg/dL (ref 8.9–10.3)
Chloride: 102 mmol/L (ref 98–111)
Creatinine, Ser: 1.17 mg/dL — ABNORMAL HIGH (ref 0.44–1.00)
GFR, Estimated: 50 mL/min — ABNORMAL LOW (ref >60)
Glucose, Bld: 116 mg/dL — ABNORMAL HIGH (ref 70–99)
Potassium: 3.3 mmol/L — ABNORMAL LOW (ref 3.5–5.1)
Sodium: 139 mmol/L (ref 135–145)

## 2021-08-20 LAB — CBC
HCT: 36.1 % (ref 36.0–46.0)
Hemoglobin: 12 g/dL (ref 12.0–15.0)
MCH: 30.1 pg (ref 26.0–34.0)
MCHC: 33.2 g/dL (ref 30.0–36.0)
MCV: 90.5 fL (ref 80.0–100.0)
Platelets: 161 10*3/uL (ref 150–400)
RBC: 3.99 MIL/uL (ref 3.87–5.11)
RDW: 12.3 % (ref 11.5–15.5)
WBC: 7.8 10*3/uL (ref 4.0–10.5)
nRBC: 0 % (ref 0.0–0.2)

## 2021-08-20 LAB — SURGICAL PCR SCREEN
MRSA, PCR: NEGATIVE
Staphylococcus aureus: NEGATIVE

## 2021-08-20 LAB — GLUCOSE, CAPILLARY: Glucose-Capillary: 109 mg/dL — ABNORMAL HIGH (ref 70–99)

## 2021-08-20 LAB — SARS CORONAVIRUS 2 (TAT 6-24 HRS): SARS Coronavirus 2: NEGATIVE

## 2021-08-20 NOTE — Progress Notes (Signed)
PCP - [name]. MD Cardiologist - Croitoru, Dani Gobble, MD  PPM/ICD - denies Device Orders - n/a Rep Notified - n/a  Chest x-ray - n/a EKG - 06/12/2021 Stress Test - 02/09/2016 ECHO - 09/07/2009 Cardiac Cath - denies  Sleep Study - yes CPAP - yes  Fasting Blood Sugar - pre-diabetes. Patient is nor checking CBG at home. CBG today - 109  Blood Thinner Instructions: n/a  Aspirin Instructions: Aspirin last dose - 08/18/2021  Patient was instructed: As of today, STOP taking any Aleve, Naproxen, Ibuprofen, Motrin, Advil, Goody's, BC's, all herbal medications, fish oil, and all vitamins.  ERAS Protcol - yes PRE-SURGERY Ensure or G2- no  COVID TEST- yes, in PAT on 08/20/2021   Anesthesia review: yes, cardiac history  Patient denies shortness of breath, fever, cough and chest pain at PAT appointment   All instructions explained to the patient, with a verbal understanding of the material. Patient agrees to go over the instructions while at home for a better understanding. Patient also instructed to self quarantine after being tested for COVID-19. The opportunity to ask questions was provided.

## 2021-08-21 ENCOUNTER — Encounter (HOSPITAL_COMMUNITY): Payer: Self-pay

## 2021-08-21 NOTE — Anesthesia Preprocedure Evaluation (Addendum)
Anesthesia Evaluation  Patient identified by MRN, date of birth, ID band Patient awake    Reviewed: Allergy & Precautions, NPO status , Patient's Chart, lab work & pertinent test results  History of Anesthesia Complications (+) PONV  Airway Mallampati: II  TM Distance: >3 FB Neck ROM: Full    Dental no notable dental hx.    Pulmonary asthma , sleep apnea and Continuous Positive Airway Pressure Ventilation ,    Pulmonary exam normal breath sounds clear to auscultation       Cardiovascular hypertension, Normal cardiovascular exam+ Valvular Problems/Murmurs AS  Rhythm:Regular Rate:Normal + Systolic murmurs  Left ventricular ejection fraction, by visual estimation, is 60 to 65%. The left ventricle has normal function. Normal left ventricular size. There is no left ventricular hypertrophy. Normal wall motion.  2. Left ventricular diastolic Doppler parameters are consistent with impaired relaxation pattern of LV diastolic filling.  3. Global right ventricle has normal systolic function.The right ventricular size is normal. No increase in right ventricular wall thickness.  4. Left atrial size was normal.  5. Right atrial size was normal.  6. Trivial pericardial effusion is present.  7. The mitral valve is normal in structure. Trace mitral valve regurgitation. No evidence of mitral stenosis.  8. The aortic valve is tricuspid Aortic valve regurgitation was not visualized by color flow Doppler. Mild aortic valve stenosis. Mean gradient 15 mmHg.  9. The tricuspid valve is normal in structure. Tricuspid valve regurgitation was not visualized by color flow Doppler.  10. The inferior vena cava is normal in size with greater than 50% respiratory variability, suggesting right atrial pressure of 3 mmHg.  11. TR signal is inadequate for assessing pulmonary artery systolic pressure.      Neuro/Psych negative neurological ROS  negative  psych ROS   GI/Hepatic Neg liver ROS, GERD  ,  Endo/Other  Hypothyroidism   Renal/GU negative Renal ROS  negative genitourinary   Musculoskeletal negative musculoskeletal ROS (+)   Abdominal   Peds negative pediatric ROS (+)  Hematology negative hematology ROS (+)   Anesthesia Other Findings   Reproductive/Obstetrics negative OB ROS                            Anesthesia Physical Anesthesia Plan  ASA: 3  Anesthesia Plan: General   Post-op Pain Management:    Induction: Intravenous  PONV Risk Score and Plan: 4 or greater and Ondansetron, Dexamethasone and Treatment may vary due to age or medical condition  Airway Management Planned: Oral ETT  Additional Equipment:   Intra-op Plan:   Post-operative Plan: Extubation in OR  Informed Consent: I have reviewed the patients History and Physical, chart, labs and discussed the procedure including the risks, benefits and alternatives for the proposed anesthesia with the patient or authorized representative who has indicated his/her understanding and acceptance.     Dental advisory given  Plan Discussed with: CRNA and Surgeon  Anesthesia Plan Comments: (See APP note by Durel Salts, FNP )       Anesthesia Quick Evaluation

## 2021-08-21 NOTE — Progress Notes (Signed)
Anesthesia Chart Review:   Case: [id number] Date/Time: 08/23/21 0715   Procedure: Lumbar 5-Sacral 1 Posterior lumbar interbody fusion (Back) - 3C/RM 20   Anesthesia type: General   Pre-op diagnosis: Chronic lumbar radiculopathy   Location: [facility] OR ROOM 20 / [facility]   Surgeons: Kristeen Miss, MD       DISCUSSION: Pt is 70 years old with hx aortic stenosis (mild by 2020 echo), HTN, chronic diastolic HF, pericardial effusion (trivial by 2020 echo), OSA, dementia   VS: BP 123/64   Pulse 65   Temp 36.8 C (Oral)   Resp 18   Ht 5\' 6"  (1.676 m)   Wt 88.7 kg   SpO2 98%   BMI 31.57 kg/m   PROVIDERS: - PCP is [name], MD - Sees cardiologist [name], MD for OSA management   LABS: Labs reviewed: Acceptable for surgery. (all labs ordered are listed, but only abnormal results are displayed)  Labs Reviewed  BASIC METABOLIC PANEL - Abnormal; Notable for the following components:      Result Value   Potassium 3.3 (*)    Glucose, Bld 116 (*)    BUN 25 (*)    Creatinine, Ser 1.17 (*)    GFR, Estimated 50 (*)    All other components within normal limits  GLUCOSE, CAPILLARY - Abnormal; Notable for the following components:   Glucose-Capillary 109 (*)    All other components within normal limits  SURGICAL PCR SCREEN  SARS CORONAVIRUS 2 (TAT 6-24 HRS)  CBC  TYPE AND SCREEN    EKG 06/12/21: sinus bradycardia with PACs   CV: Echo 09/08/19:  1. Left ventricular ejection fraction, by visual estimation, is 60 to 65%. The left ventricle has normal function. Normal left ventricular size. There is no left ventricular hypertrophy. Normal wall motion.   2. Left ventricular diastolic Doppler parameters are consistent with impaired relaxation pattern of LV diastolic filling.   3. Global right ventricle has normal systolic function.The right ventricular size is normal. No increase in right ventricular wall thickness.   4. Left atrial size was normal.   5. Right atrial size was normal.    6. Trivial pericardial effusion is present.   7. The mitral valve is normal in structure. Trace mitral valve regurgitation. No evidence of mitral stenosis.   8. The aortic valve is tricuspid Aortic valve regurgitation was not visualized by color flow Doppler. Mild aortic valve stenosis. Mean gradient 15 mmHg.   9. The tricuspid valve is normal in structure. Tricuspid valve regurgitation was not visualized by color flow Doppler.  10. The inferior vena cava is normal in size with greater than 50% respiratory variability, suggesting right atrial pressure of 3 mmHg.  11. TR signal is inadequate for assessing pulmonary artery systolic pressure.    Nuclear stress test 02/09/16:  Nuclear stress EF: 65%. No wall motion abnormalities The left ventricular ejection fraction is normal (55-65%). This is a low risk study. There is no evidence of ischemia on exam Myocardial thickness appears increased, consider left ventricular hypertrophy. Occasional PVCs seen during stress, brief paroxysmal atrial tachycardia, 5 beat run noted as well.  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 04/01/2017   Chest pain at rest 01/27/2016   Chronic diastolic heart failure ([place]) 04/22/2016   Dementia ([place])    early sings   Depression    Essential hypertension 01/27/2016  Exertional dyspnea 01/27/2016   GERD (gastroesophageal reflux disease)    HNP (herniated nucleus pulposus) 03/2017   HNP (herniated nucleus pulposus) with myelopathy, cervical 04/01/2017   Hyperlipidemia 01/27/2016   Hypersomnolence 01/27/2016   Hypothyroidism 01/27/2016   Murmur, cardiac 01/27/2016   OSA (obstructive sleep apnea) 04/22/2016   Pericardial effusion 01/27/2016   PONV (postoperative nausea and vomiting)    Pre-diabetes    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   Shortness of breath dyspnea    relating to asthma   Sleep apnea     Syncope 01/27/2016    Past Surgical History:  Procedure Laterality Date   ABDOMINAL HYSTERECTOMY     BREAST REDUCTION SURGERY  2011   CERVICAL DISC ARTHROPLASTY N/A 04/01/2017   Procedure: CERVICAL ANTERIOR [place] ARTHROPLASTY C5-C6;  Surgeon: Kristeen Miss, MD;  Location: [place];  Service: Neurosurgery;  Laterality: N/A;   COLONOSCOPY     ESOPHAGOGASTRODUODENOSCOPY     EYE SURGERY     bilateral cataracts   JOINT REPLACEMENT     right shoulder replacement   REDUCTION MAMMAPLASTY     REVERSE SHOULDER ARTHROPLASTY Right 08/02/2016   Procedure: REVERSE SHOULDER ARTHROPLASTY;  Surgeon: Netta Cedars, MD;  Location: [place];  Service: Orthopedics;  Laterality: Right;    MEDICATIONS:  albuterol (PROVENTIL HFA;VENTOLIN HFA) 108 (90 Base) MCG/ACT inhaler   amLODipine (NORVASC) 5 MG tablet   aspirin 81 MG tablet   donepezil (ARICEPT) 10 MG tablet   furosemide (LASIX) 20 MG tablet   levothyroxine (SYNTHROID, LEVOTHROID) 50 MCG tablet   lisinopril-hydrochlorothiazide (ZESTORETIC) 20-25 MG tablet   rOPINIRole (REQUIP) 0.5 MG tablet   rosuvastatin (CRESTOR) 20 MG tablet   sertraline (ZOLOFT) 100 MG tablet   traMADol (ULTRAM) 50 MG tablet   TRELEGY ELLIPTA 100-62.5-25 MCG/INH AEPB   vitamin B-12 (CYANOCOBALAMIN) 1000 MCG tablet   No current facility-administered medications for this encounter.    If no changes, I anticipate pt can proceed with surgery as scheduled.   Willeen Cass, PhD, FNP-BC [facility] Short Stay Surgical Center/Anesthesiology Phone: [phone] 08/21/2021 2:55 PM

## 2021-08-23 ENCOUNTER — Encounter (HOSPITAL_COMMUNITY): Payer: Self-pay | Admitting: Neurological Surgery

## 2021-08-23 ENCOUNTER — Inpatient Hospital Stay (HOSPITAL_COMMUNITY): Payer: Medicare Other

## 2021-08-23 ENCOUNTER — Inpatient Hospital Stay (HOSPITAL_COMMUNITY)
Admission: RE | Admit: 2021-08-23 | Discharge: 2021-08-25 | DRG: 454 | Disposition: A | Discharge status: Home / Self Care | Payer: Medicare Other | Attending: Neurological Surgery | Admitting: Neurological Surgery

## 2021-08-23 ENCOUNTER — Inpatient Hospital Stay (HOSPITAL_COMMUNITY): Payer: Medicare Other | Admitting: Emergency Medicine

## 2021-08-23 ENCOUNTER — Encounter (HOSPITAL_COMMUNITY)
Admission: RE | Disposition: A | Discharge status: Home / Self Care | Payer: Self-pay | Source: Home / Self Care | Attending: Neurological Surgery

## 2021-08-23 ENCOUNTER — Inpatient Hospital Stay (HOSPITAL_COMMUNITY): Payer: Medicare Other | Admitting: Anesthesiology

## 2021-08-23 ENCOUNTER — Other Ambulatory Visit: Payer: Self-pay

## 2021-08-23 DIAGNOSIS — I110 Hypertensive heart disease with heart failure: Secondary | ICD-10-CM | POA: Diagnosis present

## 2021-08-23 DIAGNOSIS — R531 Weakness: Secondary | ICD-10-CM | POA: Diagnosis present

## 2021-08-23 DIAGNOSIS — Z90710 Acquired absence of both cervix and uterus: Secondary | ICD-10-CM | POA: Diagnosis not present

## 2021-08-23 DIAGNOSIS — I5032 Chronic diastolic (congestive) heart failure: Secondary | ICD-10-CM | POA: Diagnosis present

## 2021-08-23 DIAGNOSIS — Z8249 Family history of ischemic heart disease and other diseases of the circulatory system: Secondary | ICD-10-CM | POA: Diagnosis not present

## 2021-08-23 DIAGNOSIS — Z20822 Contact with and (suspected) exposure to covid-19: Secondary | ICD-10-CM | POA: Diagnosis present

## 2021-08-23 DIAGNOSIS — Z7982 Long term (current) use of aspirin: Secondary | ICD-10-CM | POA: Diagnosis not present

## 2021-08-23 DIAGNOSIS — Z79890 Hormone replacement therapy (postmenopausal): Secondary | ICD-10-CM

## 2021-08-23 DIAGNOSIS — Z79899 Other long term (current) drug therapy: Secondary | ICD-10-CM

## 2021-08-23 DIAGNOSIS — M4727 Other spondylosis with radiculopathy, lumbosacral region: Secondary | ICD-10-CM | POA: Diagnosis present

## 2021-08-23 DIAGNOSIS — E039 Hypothyroidism, unspecified: Secondary | ICD-10-CM | POA: Diagnosis present

## 2021-08-23 DIAGNOSIS — M4317 Spondylolisthesis, lumbosacral region: Secondary | ICD-10-CM | POA: Diagnosis present

## 2021-08-23 DIAGNOSIS — E785 Hyperlipidemia, unspecified: Secondary | ICD-10-CM | POA: Diagnosis present

## 2021-08-23 DIAGNOSIS — Z981 Arthrodesis status: Secondary | ICD-10-CM | POA: Diagnosis not present

## 2021-08-23 DIAGNOSIS — M4807 Spinal stenosis, lumbosacral region: Principal | ICD-10-CM | POA: Diagnosis present

## 2021-08-23 DIAGNOSIS — F419 Anxiety disorder, unspecified: Secondary | ICD-10-CM | POA: Diagnosis present

## 2021-08-23 DIAGNOSIS — M48062 Spinal stenosis, lumbar region with neurogenic claudication: Secondary | ICD-10-CM | POA: Diagnosis present

## 2021-08-23 DIAGNOSIS — K219 Gastro-esophageal reflux disease without esophagitis: Secondary | ICD-10-CM | POA: Diagnosis present

## 2021-08-23 DIAGNOSIS — M4157 Other secondary scoliosis, lumbosacral region: Secondary | ICD-10-CM | POA: Diagnosis present

## 2021-08-23 DIAGNOSIS — D62 Acute posthemorrhagic anemia: Secondary | ICD-10-CM | POA: Diagnosis not present

## 2021-08-23 DIAGNOSIS — Z801 Family history of malignant neoplasm of trachea, bronchus and lung: Secondary | ICD-10-CM | POA: Diagnosis not present

## 2021-08-23 DIAGNOSIS — M48061 Spinal stenosis, lumbar region without neurogenic claudication: Secondary | ICD-10-CM | POA: Diagnosis present

## 2021-08-23 DIAGNOSIS — F32A Depression, unspecified: Secondary | ICD-10-CM | POA: Diagnosis present

## 2021-08-23 DIAGNOSIS — F0390 Unspecified dementia without behavioral disturbance: Secondary | ICD-10-CM | POA: Diagnosis present

## 2021-08-23 DIAGNOSIS — J45909 Unspecified asthma, uncomplicated: Secondary | ICD-10-CM | POA: Diagnosis present

## 2021-08-23 DIAGNOSIS — Z833 Family history of diabetes mellitus: Secondary | ICD-10-CM

## 2021-08-23 DIAGNOSIS — Z419 Encounter for procedure for purposes other than remedying health state, unspecified: Secondary | ICD-10-CM

## 2021-08-23 DIAGNOSIS — M4326 Fusion of spine, lumbar region: Secondary | ICD-10-CM | POA: Diagnosis not present

## 2021-08-23 HISTORY — DX: Spinal stenosis, lumbar region with neurogenic claudication: M48.062

## 2021-08-23 HISTORY — PX: BACK SURGERY: SHX140

## 2021-08-23 LAB — GLUCOSE, CAPILLARY: Glucose-Capillary: 125 mg/dL — ABNORMAL HIGH (ref 70–99)

## 2021-08-23 LAB — ABO/RH: ABO/RH(D): A POS

## 2021-08-23 SURGERY — POSTERIOR LUMBAR FUSION 1 LEVEL
Anesthesia: General | Site: Spine Lumbar

## 2021-08-23 MED ORDER — MENTHOL 3 MG MT LOZG: 1.0000 | LOZENGE | OROMUCOSAL | Status: DC | PRN

## 2021-08-23 MED ORDER — LISINOPRIL-HYDROCHLOROTHIAZIDE 20-25 MG PO TABS: 1.0000 | ORAL_TABLET | Freq: Every day | ORAL | Status: DC

## 2021-08-23 MED ORDER — ALBUTEROL SULFATE (2.5 MG/3ML) 0.083% IN NEBU: 2.5000 mg | INHALATION_SOLUTION | RESPIRATORY_TRACT | Status: DC | PRN

## 2021-08-23 MED ORDER — THROMBIN 5000 UNITS EX SOLR: OROMUCOSAL | Status: DC | PRN

## 2021-08-23 MED ORDER — LACTATED RINGERS IV SOLN: INTRAVENOUS | Status: DC

## 2021-08-23 MED ORDER — ONDANSETRON HCL 4 MG/2ML IJ SOLN
INTRAMUSCULAR | Status: AC
  Filled 2021-08-23: qty 2

## 2021-08-23 MED ORDER — EPHEDRINE SULFATE-NACL 50-0.9 MG/10ML-% IV SOSY
PREFILLED_SYRINGE | INTRAVENOUS | Status: DC | PRN
  Administered 2021-08-23 (×2): 15 mg via INTRAVENOUS
  Administered 2021-08-23 (×2): 10 mg via INTRAVENOUS

## 2021-08-23 MED ORDER — METHOCARBAMOL 500 MG PO TABS
500.0000 mg | ORAL_TABLET | Freq: Four times a day (QID) | ORAL | Status: DC | PRN
  Administered 2021-08-23 – 2021-08-25 (×4): 500 mg via ORAL
  Filled 2021-08-23 (×4): qty 1

## 2021-08-23 MED ORDER — CEFAZOLIN SODIUM-DEXTROSE 2-4 GM/100ML-% IV SOLN
2.0000 g | INTRAVENOUS | Status: AC
  Administered 2021-08-23: 2 g via INTRAVENOUS
  Filled 2021-08-23: qty 100

## 2021-08-23 MED ORDER — CHLORHEXIDINE GLUCONATE CLOTH 2 % EX PADS: 6.0000 | MEDICATED_PAD | Freq: Once | CUTANEOUS | Status: DC

## 2021-08-23 MED ORDER — POLYETHYLENE GLYCOL 3350 17 G PO PACK: 17.0000 g | PACK | Freq: Every day | ORAL | Status: DC | PRN

## 2021-08-23 MED ORDER — DOCUSATE SODIUM 100 MG PO CAPS
100.0000 mg | ORAL_CAPSULE | Freq: Two times a day (BID) | ORAL | Status: DC
  Administered 2021-08-23 – 2021-08-25 (×4): 100 mg via ORAL
  Filled 2021-08-23 (×4): qty 1

## 2021-08-23 MED ORDER — AMLODIPINE BESYLATE 5 MG PO TABS: 5.0000 mg | ORAL_TABLET | Freq: Every day | ORAL | Status: DC

## 2021-08-23 MED ORDER — BUPIVACAINE HCL (PF) 0.5 % IJ SOLN
INTRAMUSCULAR | Status: AC
  Filled 2021-08-23: qty 30

## 2021-08-23 MED ORDER — ACETAMINOPHEN 10 MG/ML IV SOLN: 1000.0000 mg | Freq: Once | INTRAVENOUS | Status: DC | PRN

## 2021-08-23 MED ORDER — ROSUVASTATIN CALCIUM 5 MG PO TABS
10.0000 mg | ORAL_TABLET | Freq: Every day | ORAL | Status: DC
  Administered 2021-08-24 – 2021-08-25 (×2): 10 mg via ORAL
  Filled 2021-08-23 (×2): qty 2

## 2021-08-23 MED ORDER — LIDOCAINE 2% (20 MG/ML) 5 ML SYRINGE
INTRAMUSCULAR | Status: AC
  Filled 2021-08-23: qty 5

## 2021-08-23 MED ORDER — FLEET ENEMA 7-19 GM/118ML RE ENEM
1.0000 | ENEMA | Freq: Once | RECTAL | Status: DC | PRN
Start: 2021-08-23 — End: 2021-08-25

## 2021-08-23 MED ORDER — FLUTICASONE FUROATE-VILANTEROL 100-25 MCG/INH IN AEPB
1.0000 | INHALATION_SPRAY | RESPIRATORY_TRACT | Status: DC
  Filled 2021-08-23: qty 28

## 2021-08-23 MED ORDER — BUPIVACAINE HCL (PF) 0.5 % IJ SOLN
INTRAMUSCULAR | Status: DC | PRN
  Administered 2021-08-23: 5 mL
  Administered 2021-08-23: 25 mL

## 2021-08-23 MED ORDER — HYDROMORPHONE HCL 1 MG/ML IJ SOLN
INTRAMUSCULAR | Status: AC
  Filled 2021-08-23: qty 1

## 2021-08-23 MED ORDER — ROCURONIUM BROMIDE 10 MG/ML (PF) SYRINGE
PREFILLED_SYRINGE | INTRAVENOUS | Status: DC | PRN
  Administered 2021-08-23: 50 mg via INTRAVENOUS
  Administered 2021-08-23: 70 mg via INTRAVENOUS
  Administered 2021-08-23: 30 mg via INTRAVENOUS

## 2021-08-23 MED ORDER — TRAMADOL HCL 50 MG PO TABS
50.0000 mg | ORAL_TABLET | ORAL | Status: DC | PRN
Start: 2021-08-23 — End: 2021-08-25
  Administered 2021-08-23 – 2021-08-24 (×2): 50 mg via ORAL
  Filled 2021-08-23 (×2): qty 1

## 2021-08-23 MED ORDER — CEFAZOLIN SODIUM-DEXTROSE 2-4 GM/100ML-% IV SOLN
2.0000 g | Freq: Three times a day (TID) | INTRAVENOUS | Status: AC
  Administered 2021-08-23 (×2): 2 g via INTRAVENOUS
  Filled 2021-08-23 (×2): qty 100

## 2021-08-23 MED ORDER — ONDANSETRON HCL 4 MG PO TABS: 4.0000 mg | ORAL_TABLET | Freq: Four times a day (QID) | ORAL | Status: DC | PRN

## 2021-08-23 MED ORDER — ACETAMINOPHEN 10 MG/ML IV SOLN
INTRAVENOUS | Status: AC
  Filled 2021-08-23: qty 100

## 2021-08-23 MED ORDER — DEXAMETHASONE SODIUM PHOSPHATE 10 MG/ML IJ SOLN
INTRAMUSCULAR | Status: AC
  Filled 2021-08-23: qty 1

## 2021-08-23 MED ORDER — SODIUM CHLORIDE 0.9% FLUSH: 3.0000 mL | INTRAVENOUS | Status: DC | PRN

## 2021-08-23 MED ORDER — LIDOCAINE-EPINEPHRINE 1 %-1:100000 IJ SOLN
INTRAMUSCULAR | Status: DC | PRN
  Administered 2021-08-23: 5 mL

## 2021-08-23 MED ORDER — SERTRALINE HCL 50 MG PO TABS
100.0000 mg | ORAL_TABLET | Freq: Every day | ORAL | Status: DC
  Administered 2021-08-24 – 2021-08-25 (×2): 100 mg via ORAL
  Filled 2021-08-23 (×2): qty 2

## 2021-08-23 MED ORDER — FUROSEMIDE 20 MG PO TABS: 20.0000 mg | ORAL_TABLET | Freq: Every day | ORAL | Status: DC

## 2021-08-23 MED ORDER — MIDAZOLAM HCL 2 MG/2ML IJ SOLN
INTRAMUSCULAR | Status: AC
  Filled 2021-08-23: qty 2

## 2021-08-23 MED ORDER — PHENYLEPHRINE HCL-NACL 20-0.9 MG/250ML-% IV SOLN
INTRAVENOUS | Status: DC | PRN
  Administered 2021-08-23: 50 ug/min via INTRAVENOUS

## 2021-08-23 MED ORDER — ROPINIROLE HCL 1 MG PO TABS
0.5000 mg | ORAL_TABLET | Freq: Every day | ORAL | Status: DC
  Administered 2021-08-23 – 2021-08-24 (×2): 0.5 mg via ORAL
  Filled 2021-08-23 (×2): qty 1

## 2021-08-23 MED ORDER — ACETAMINOPHEN 10 MG/ML IV SOLN
INTRAVENOUS | Status: DC | PRN
  Administered 2021-08-23: 1000 mg via INTRAVENOUS

## 2021-08-23 MED ORDER — DONEPEZIL HCL 10 MG PO TABS
10.0000 mg | ORAL_TABLET | Freq: Every day | ORAL | Status: DC
  Administered 2021-08-23 – 2021-08-24 (×2): 10 mg via ORAL
  Filled 2021-08-23 (×3): qty 1

## 2021-08-23 MED ORDER — ALBUMIN HUMAN 5 % IV SOLN: INTRAVENOUS | Status: DC | PRN

## 2021-08-23 MED ORDER — CHLORHEXIDINE GLUCONATE 0.12 % MT SOLN
15.0000 mL | Freq: Once | OROMUCOSAL | Status: AC
  Administered 2021-08-23: 15 mL via OROMUCOSAL
  Filled 2021-08-23: qty 15

## 2021-08-23 MED ORDER — EPHEDRINE 5 MG/ML INJ
INTRAVENOUS | Status: AC
  Filled 2021-08-23: qty 5

## 2021-08-23 MED ORDER — 0.9 % SODIUM CHLORIDE (POUR BTL) OPTIME
TOPICAL | Status: DC | PRN
  Administered 2021-08-23: 1000 mL

## 2021-08-23 MED ORDER — TRAMADOL HCL 50 MG PO TABS
50.0000 mg | ORAL_TABLET | Freq: Four times a day (QID) | ORAL | Status: DC | PRN
  Administered 2021-08-23: 50 mg via ORAL
  Filled 2021-08-23: qty 1

## 2021-08-23 MED ORDER — LISINOPRIL 20 MG PO TABS: 20.0000 mg | ORAL_TABLET | Freq: Every day | ORAL | Status: DC

## 2021-08-23 MED ORDER — ONDANSETRON HCL 4 MG/2ML IJ SOLN: 4.0000 mg | Freq: Once | INTRAMUSCULAR | Status: DC | PRN

## 2021-08-23 MED ORDER — LIDOCAINE-EPINEPHRINE 1 %-1:100000 IJ SOLN
INTRAMUSCULAR | Status: AC
  Filled 2021-08-23: qty 1

## 2021-08-23 MED ORDER — PHENOL 1.4 % MT LIQD: 1.0000 | OROMUCOSAL | Status: DC | PRN

## 2021-08-23 MED ORDER — FENTANYL CITRATE (PF) 250 MCG/5ML IJ SOLN
INTRAMUSCULAR | Status: DC | PRN
  Administered 2021-08-23 (×5): 50 ug via INTRAVENOUS

## 2021-08-23 MED ORDER — PHENYLEPHRINE 40 MCG/ML (10ML) SYRINGE FOR IV PUSH (FOR BLOOD PRESSURE SUPPORT)
PREFILLED_SYRINGE | INTRAVENOUS | Status: DC | PRN
  Administered 2021-08-23: 120 ug via INTRAVENOUS
  Administered 2021-08-23: 400 ug via INTRAVENOUS
  Administered 2021-08-23: 160 ug via INTRAVENOUS

## 2021-08-23 MED ORDER — LIDOCAINE 2% (20 MG/ML) 5 ML SYRINGE
INTRAMUSCULAR | Status: DC | PRN
Start: 2021-08-23 — End: 2021-08-23
  Administered 2021-08-23: 100 mg via INTRAVENOUS

## 2021-08-23 MED ORDER — SENNA 8.6 MG PO TABS
1.0000 | ORAL_TABLET | Freq: Two times a day (BID) | ORAL | Status: DC
  Administered 2021-08-23 – 2021-08-25 (×4): 8.6 mg via ORAL
  Filled 2021-08-23 (×4): qty 1

## 2021-08-23 MED ORDER — MORPHINE SULFATE (PF) 2 MG/ML IV SOLN
2.0000 mg | INTRAVENOUS | Status: DC | PRN
  Administered 2021-08-23: 2 mg via INTRAVENOUS
  Filled 2021-08-23: qty 1

## 2021-08-23 MED ORDER — METHOCARBAMOL 1000 MG/10ML IJ SOLN
500.0000 mg | Freq: Four times a day (QID) | INTRAVENOUS | Status: DC | PRN
  Filled 2021-08-23: qty 5

## 2021-08-23 MED ORDER — HYDROCHLOROTHIAZIDE 25 MG PO TABS: 25.0000 mg | ORAL_TABLET | Freq: Every day | ORAL | Status: DC

## 2021-08-23 MED ORDER — ACETAMINOPHEN 325 MG PO TABS
650.0000 mg | ORAL_TABLET | ORAL | Status: DC | PRN
  Administered 2021-08-24: 650 mg via ORAL
  Filled 2021-08-23: qty 2

## 2021-08-23 MED ORDER — SODIUM CHLORIDE 0.9% FLUSH
3.0000 mL | Freq: Two times a day (BID) | INTRAVENOUS | Status: DC
  Administered 2021-08-23: 3 mL via INTRAVENOUS

## 2021-08-23 MED ORDER — UMECLIDINIUM BROMIDE 62.5 MCG/INH IN AEPB
1.0000 | INHALATION_SPRAY | RESPIRATORY_TRACT | Status: DC
  Filled 2021-08-23: qty 7

## 2021-08-23 MED ORDER — ORAL CARE MOUTH RINSE: 15.0000 mL | Freq: Once | OROMUCOSAL | Status: AC

## 2021-08-23 MED ORDER — FLUTICASONE-UMECLIDIN-VILANT 100-62.5-25 MCG/INH IN AEPB: 1.0000 | INHALATION_SPRAY | RESPIRATORY_TRACT | Status: DC

## 2021-08-23 MED ORDER — ROCURONIUM BROMIDE 10 MG/ML (PF) SYRINGE
PREFILLED_SYRINGE | INTRAVENOUS | Status: AC
  Filled 2021-08-23: qty 10

## 2021-08-23 MED ORDER — FENTANYL CITRATE (PF) 250 MCG/5ML IJ SOLN
INTRAMUSCULAR | Status: AC
  Filled 2021-08-23: qty 5

## 2021-08-23 MED ORDER — ALBUTEROL SULFATE HFA 108 (90 BASE) MCG/ACT IN AERS: 2.0000 | INHALATION_SPRAY | Freq: Four times a day (QID) | RESPIRATORY_TRACT | Status: DC | PRN

## 2021-08-23 MED ORDER — ACETAMINOPHEN 650 MG RE SUPP: 650.0000 mg | RECTAL | Status: DC | PRN

## 2021-08-23 MED ORDER — DEXAMETHASONE SODIUM PHOSPHATE 10 MG/ML IJ SOLN
INTRAMUSCULAR | Status: DC | PRN
  Administered 2021-08-23: 10 mg via INTRAVENOUS

## 2021-08-23 MED ORDER — PROPOFOL 10 MG/ML IV BOLUS
INTRAVENOUS | Status: AC
  Filled 2021-08-23: qty 20

## 2021-08-23 MED ORDER — ALUM & MAG HYDROXIDE-SIMETH 200-200-20 MG/5ML PO SUSP: 30.0000 mL | Freq: Four times a day (QID) | ORAL | Status: DC | PRN

## 2021-08-23 MED ORDER — LEVOTHYROXINE SODIUM 25 MCG PO TABS
50.0000 ug | ORAL_TABLET | Freq: Every day | ORAL | Status: DC
  Administered 2021-08-24 – 2021-08-25 (×2): 50 ug via ORAL
  Filled 2021-08-23 (×2): qty 2

## 2021-08-23 MED ORDER — MIDAZOLAM HCL 2 MG/2ML IJ SOLN
INTRAMUSCULAR | Status: DC | PRN
  Administered 2021-08-23: 1 mg via INTRAVENOUS

## 2021-08-23 MED ORDER — SODIUM CHLORIDE 0.9 % IV SOLN: 250.0000 mL | INTRAVENOUS | Status: DC

## 2021-08-23 MED ORDER — ONDANSETRON HCL 4 MG/2ML IJ SOLN
4.0000 mg | Freq: Four times a day (QID) | INTRAMUSCULAR | Status: DC | PRN
  Administered 2021-08-24: 4 mg via INTRAVENOUS
  Filled 2021-08-23: qty 2

## 2021-08-23 MED ORDER — THROMBIN 5000 UNITS EX SOLR
CUTANEOUS | Status: AC
  Filled 2021-08-23: qty 5000

## 2021-08-23 MED ORDER — PROPOFOL 10 MG/ML IV BOLUS
INTRAVENOUS | Status: DC | PRN
  Administered 2021-08-23: 140 mg via INTRAVENOUS

## 2021-08-23 MED ORDER — BISACODYL 10 MG RE SUPP: 10.0000 mg | Freq: Every day | RECTAL | Status: DC | PRN

## 2021-08-23 MED ORDER — HYDROMORPHONE HCL 1 MG/ML IJ SOLN
0.2500 mg | INTRAMUSCULAR | Status: DC | PRN
  Administered 2021-08-23 (×3): 0.5 mg via INTRAVENOUS

## 2021-08-23 SURGICAL SUPPLY — 59 items
ADH SKN CLS APL DERMABOND .7 (GAUZE/BANDAGES/DRESSINGS) ×1
BAG COUNTER SPONGE SURGICOUNT (BAG) ×3 IMPLANT
BAG SPNG CNTER NS LX DISP (BAG) ×2
BASKET BONE COLLECTION (BASKET) ×2 IMPLANT
BONE CANC CHIPS 20CC PCAN1/4 (Bone Implant) ×2 IMPLANT
BUR MATCHSTICK NEURO 3.0 LAGG (BURR) ×2 IMPLANT
CAGE COROENT LG 10X9X23-12 (Cage) ×2 IMPLANT
CANISTER SUCT 3000ML PPV (MISCELLANEOUS) ×2 IMPLANT
CHIPS CANC BONE 20CC PCAN1/4 (Bone Implant) ×1 IMPLANT
CNTNR URN SCR LID CUP LEK RST (MISCELLANEOUS) ×1 IMPLANT
CONT SPEC 4OZ STRL OR WHT (MISCELLANEOUS) ×2
COVER BACK TABLE 60X90IN (DRAPES) ×2 IMPLANT
DERMABOND ADVANCED (GAUZE/BANDAGES/DRESSINGS) ×1
DERMABOND ADVANCED .7 DNX12 (GAUZE/BANDAGES/DRESSINGS) ×1 IMPLANT
DEVICE DISSECT PLASMABLAD 3.0S (MISCELLANEOUS) ×1 IMPLANT
DRAPE C-ARM 42X72 X-RAY (DRAPES) ×4 IMPLANT
DRAPE C-ARMOR (DRAPES) ×2 IMPLANT
DRAPE LAPAROTOMY 100X72X124 (DRAPES) ×2 IMPLANT
DURAPREP 26ML APPLICATOR (WOUND CARE) ×2 IMPLANT
ELECT REM PT RETURN 9FT ADLT (ELECTROSURGICAL) ×2
ELECTRODE REM PT RTRN 9FT ADLT (ELECTROSURGICAL) ×1 IMPLANT
GAUZE 4X4 16PLY ~~LOC~~+RFID DBL (SPONGE) ×1 IMPLANT
GLOVE SURG LTX SZ7.5 (GLOVE) ×2 IMPLANT
GLOVE SURG LTX SZ8 (GLOVE) ×2 IMPLANT
GLOVE SURG LTX SZ8.5 (GLOVE) ×5 IMPLANT
GLOVE SURG UNDER POLY LF SZ7 (GLOVE) ×1 IMPLANT
GLOVE SURG UNDER POLY LF SZ7.5 (GLOVE) ×2 IMPLANT
GLOVE SURG UNDER POLY LF SZ8.5 (GLOVE) ×5 IMPLANT
GOWN STRL REUS W/ TWL LRG LVL3 (GOWN DISPOSABLE) IMPLANT
GOWN STRL REUS W/ TWL XL LVL3 (GOWN DISPOSABLE) IMPLANT
GOWN STRL REUS W/TWL 2XL LVL3 (GOWN DISPOSABLE) ×5 IMPLANT
GOWN STRL REUS W/TWL LRG LVL3 (GOWN DISPOSABLE) ×4
GOWN STRL REUS W/TWL XL LVL3 (GOWN DISPOSABLE) ×4
GRAFT BNE CANC CHIPS 1-8 20CC (Bone Implant) IMPLANT
GRAFT BONE PROTEIOS LRG 5CC (Orthopedic Implant) ×1 IMPLANT
HEMOSTAT POWDER KIT SURGIFOAM (HEMOSTASIS) ×1 IMPLANT
KIT BASIN OR (CUSTOM PROCEDURE TRAY) ×2 IMPLANT
KIT GRAFTMAG DEL NEURO DISP (NEUROSURGERY SUPPLIES) ×1 IMPLANT
KIT TURNOVER KIT B (KITS) ×2 IMPLANT
NEEDLE HYPO 22GX1.5 SAFETY (NEEDLE) ×2 IMPLANT
NS IRRIG 1000ML POUR BTL (IV SOLUTION) ×2 IMPLANT
PACK LAMINECTOMY NEURO (CUSTOM PROCEDURE TRAY) ×2 IMPLANT
PATTIES SURGICAL .5 X1 (DISPOSABLE) ×2 IMPLANT
PLASMABLADE 3.0S (MISCELLANEOUS) ×2
ROD RELINE-O LORD 5.5X40 (Rod) ×2 IMPLANT
SCREW LOCK RELINE 5.5 TULIP (Screw) ×4 IMPLANT
SCREW RELINE-O POLY 6.5X45 (Screw) ×4 IMPLANT
SPONGE T-LAP 4X18 ~~LOC~~+RFID (SPONGE) ×1 IMPLANT
SUT PROLENE 6 0 BV (SUTURE) IMPLANT
SUT VIC AB 1 CT1 18XBRD ANBCTR (SUTURE) ×1 IMPLANT
SUT VIC AB 1 CT1 8-18 (SUTURE) ×2
SUT VIC AB 2-0 CP2 18 (SUTURE) ×2 IMPLANT
SUT VIC AB 3-0 SH 8-18 (SUTURE) ×2 IMPLANT
SUT VIC AB 4-0 RB1 18 (SUTURE) ×2 IMPLANT
SYR 3ML LL SCALE MARK (SYRINGE) ×8 IMPLANT
TOWEL GREEN STERILE (TOWEL DISPOSABLE) ×2 IMPLANT
TOWEL GREEN STERILE FF (TOWEL DISPOSABLE) ×2 IMPLANT
TRAY FOLEY MTR SLVR 16FR STAT (SET/KITS/TRAYS/PACK) ×2 IMPLANT
WATER STERILE IRR 1000ML POUR (IV SOLUTION) ×2 IMPLANT

## 2021-08-23 NOTE — Transfer of Care (Signed)
Immediate Anesthesia Transfer of Care Note  Patient: Charlotte [name]  Procedure(s) Performed: Lumbar Five-Sacral One Posterior Lumbar Interbody Fusion (Spine Lumbar)  Patient Location: PACU  Anesthesia Type:General  Level of Consciousness: drowsy and patient cooperative  Airway & Oxygen Therapy: Patient Spontanous Breathing  Post-op Assessment: Report given to RN and Post -op Vital signs reviewed and stable  Post vital signs: Reviewed and stable  Last Vitals:  Vitals Value Taken Time  BP 118/43 08/23/21 1204  Temp    Pulse 100 08/23/21 1204  Resp 20 08/23/21 1204  SpO2 99 % 08/23/21 1204  Vitals shown include unvalidated device data.  Last Pain:  Vitals:   08/23/21 0608  TempSrc:   PainSc: 5       Patients Stated Pain Goal: 2 (46/80/32 1224)  Complications: No notable events documented.

## 2021-08-23 NOTE — Progress Notes (Signed)
Orthopedic Tech Progress Note Patient Details:  Charlotte [name] 01-24-1951 [id number]  Ortho Devices Type of Ortho Device: Lumbar corsett Ortho Device/Splint Interventions: Ordered      Charlotte [name] 08/23/2021, 3:02 PM

## 2021-08-23 NOTE — Anesthesia Postprocedure Evaluation (Signed)
Anesthesia Post Note  Patient: Charlotte [name]  Procedure(s) Performed: Lumbar Five-Sacral One Posterior Lumbar Interbody Fusion (Spine Lumbar)     Patient location during evaluation: PACU Anesthesia Type: General Level of consciousness: awake and alert Pain management: pain level controlled Vital Signs Assessment: post-procedure vital signs reviewed and stable Respiratory status: spontaneous breathing, nonlabored ventilation, respiratory function stable and patient connected to nasal cannula oxygen Cardiovascular status: blood pressure returned to baseline and stable Postop Assessment: no apparent nausea or vomiting Anesthetic complications: no   No notable events documented.  Last Vitals:  Vitals:   08/23/21 1250 08/23/21 1305  BP: 124/60 114/79  Pulse: 90 90  Resp: 10 17  Temp:  36.9 C  SpO2: 97% 100%    Last Pain:  Vitals:   08/23/21 1305  TempSrc:   PainSc: Asleep                 Cherry Wittwer S

## 2021-08-23 NOTE — Op Note (Signed)
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  Decompression the L5 and S1 nerve roots.  Posterior lumbar interbody arthrodesis with peek spacers local autograft allograft and Proteus.  Posterolateral arthrodesis with local autograft allograft and Proteus.  Rectal screw fixation L5-S1. Surgeon: Kristeen Miss First Assistant: Duffy Rhody, MD Anesthesia: General endotracheal Indications: Charlotte [name] is a 70 year old individual whose had significant pain and weakness in the left lower extremity she has degenerative scoliosis in addition to advanced spondylitic changes at the L5-S1 level with a large extruded fragment of disc in the extraforaminal zone she is advised regarding the nature of surgical decompression but advised also that this joint should be stabilized she is now admitted to undergo surgical decompression arthrodesis at the L5-S1 level.  Procedure: Patient was brought to the operating room supine on the stretcher.  After the smooth induction of general endotracheal anesthesia, she was carefully turned prone.  The back was prepped with alcohol DuraPrep and draped in a sterile fashion.  Midline incision was created and carried down to the lumbodorsal fascia which was opened on either side of midline to expose was identified as the spinous process of L5.  Dissection was then carried down to the L5-S1 space and carefully carried over the facet joints at L5-S1.  The dissection was carried superiorly to expose the inferior margin of the facet joints at L4-5.  Lateral gutters were then decorticated and the intertransverse space between the L5 transverse process and the sacral ala was decorticated and packed away for later use and grafting.  Laminectomy was then created removing the inferior margin  lamina of L5 out to and including the entirety of the facet at L5-S1.  Common dural tube was isolated.  Care was taken to protect the S1 nerve root inferiorly and then dissect and decompress the L5 nerve root superiorly into the extraforaminal zone.  After this was done on the left side attention was turned to the right side where similar process was carried out.  The disc space was then entered with a #15 blade and a combination of curettes and rongeurs and disc shavers were used open the disc space and emptied of a substantial quantity of severely degenerated desiccated disc material.  With the area decompressed the discectomy was performed bilaterally and the extraforaminal zone on the right side and the left side was particularly well dissected of some significant disc material.  The inner body space was then opened further and ultimately was felt that a 10 mm tall 12 degree lordotic spacer measuring 23 mm in length would fit best into this interval.  The spacers were packed with autologous bone and allograft in addition to Proteus and a total of 14 cc of bone graft was packed into the interspace along with the 2 spacers.  Attention was then turned to placing pedicle screws at L5 and S1 this was done with use of fluoroscopic guidance 6.5 x 45 mm screws were used for this purpose and 240 mm precontoured rods were used to connect the screw screws together in a neutral fashion.  Lateral gutters were then packed with the remaining 6 cc of bone on each side.  With this the area was inspected carefully final radiographs were obtained which showed good alignment and a good decompression was assured particularly for the L5 nerve root on that left side.  Then after  an injecting approximately 25 cc of half percent Marcaine into the paraspinous fascia the lumbodorsal fascia was closed with #1 Vicryl in interrupted fashion 2-0 Vicryl was used in the subcutaneous tissues 3-0 Vicryl subcuticularly and Dermabond was placed on  the skin blood loss was estimated 250 cc.

## 2021-08-23 NOTE — H&P (Signed)
Charlotte [name] is an 70 y.o. female.   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  She has particular degeneration at the L5-S1 level where she has left-sided foraminal stenosis at the L5 nerve root.  She has been treated conservatively for a number of years however given the fact that the pain is persisting and weakness is now ensuing, she has been advised regarding surgical decompression stabilization at L5-S1.  She is now admitted for this procedure.  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 04/01/2017   Chest pain at rest 01/27/2016   Chronic diastolic heart failure ([place]) 04/22/2016   Dementia ([place])    early sings   Depression    Essential hypertension 01/27/2016   Exertional dyspnea 01/27/2016   GERD (gastroesophageal reflux disease)    HNP (herniated nucleus pulposus) 03/2017   HNP (herniated nucleus pulposus) with myelopathy, cervical 04/01/2017   Hyperlipidemia 01/27/2016   Hypersomnolence 01/27/2016   Hypothyroidism 01/27/2016   OSA (obstructive sleep apnea) 04/22/2016   Pericardial effusion 01/27/2016   PONV (postoperative nausea and vomiting)    Pre-diabetes    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   Shortness of breath dyspnea    relating to asthma   Sleep apnea    Syncope 01/27/2016    Past Surgical History:  Procedure Laterality Date   ABDOMINAL HYSTERECTOMY     BREAST REDUCTION SURGERY  2011   CERVICAL DISC ARTHROPLASTY N/A 04/01/2017   Procedure: CERVICAL ANTERIOR [place] ARTHROPLASTY C5-C6;  Surgeon: Kristeen Miss, MD;  Location: [facility];  Service: Neurosurgery;  Laterality: N/A;   COLONOSCOPY     ESOPHAGOGASTRODUODENOSCOPY     EYE SURGERY     bilateral cataracts   JOINT REPLACEMENT     right  shoulder replacement   REDUCTION MAMMAPLASTY     REVERSE SHOULDER ARTHROPLASTY Right 08/02/2016   Procedure: REVERSE SHOULDER ARTHROPLASTY;  Surgeon: Netta Cedars, MD;  Location: [facility];  Service: Orthopedics;  Laterality: Right;    Family History  Problem Relation Age of Onset   Heart failure Mother    Diabetes Mother    Hypertension Mother    Hyperlipidemia Mother    Lung cancer Father    Diabetes Father    Hypertension Father    Hypertension Sister    Hyperlipidemia Sister    Hyperthyroidism Sister    Lung cancer Brother    Rheum arthritis Child    Lung cancer Child    Dementia Maternal Grandmother    Heart disease Maternal Grandfather    Lung cancer Paternal Grandfather    Diabetes Brother    Hyperlipidemia Brother    Hyperlipidemia Brother    Dementia Maternal Aunt    Social History:  reports that she has never smoked. She has never used smokeless tobacco. She reports that she does not drink alcohol and does not use drugs.  Allergies: No Known Allergies  Medications Prior to Admission  Medication Sig Dispense Refill   albuterol (PROVENTIL HFA;VENTOLIN HFA) 108 (90 Base) MCG/ACT inhaler Inhale 2 puffs into the lungs every 6 (six) hours as needed for wheezing or shortness of breath.      amLODipine (NORVASC) 5 MG tablet TAKE 1 TABLET BY MOUTH  DAILY 90 tablet 3  aspirin 81 MG tablet Take 81 mg by mouth daily.     donepezil (ARICEPT) 10 MG tablet Take 10 mg by mouth at bedtime.     furosemide (LASIX) 20 MG tablet Take 1 tablet (20 mg total) by mouth daily. NEED OV. 30 tablet 0   levothyroxine (SYNTHROID, LEVOTHROID) 50 MCG tablet Take 50 mcg by mouth daily.      lisinopril-hydrochlorothiazide (ZESTORETIC) 20-25 MG tablet Take 1 tablet by mouth daily.     rOPINIRole (REQUIP) 0.5 MG tablet Take 0.5 mg by mouth at bedtime.     rosuvastatin (CRESTOR) 20 MG tablet TAKE 1 TABLET BY MOUTH  DAILY (KEEP OFFICE VISIT) 90 tablet 3   sertraline (ZOLOFT) 100 MG tablet Take 100 mg by  mouth daily.     traMADol (ULTRAM) 50 MG tablet Take 50 mg by mouth every 6 (six) hours as needed for severe pain.     TRELEGY ELLIPTA 100-62.5-25 MCG/INH AEPB Inhale 1 puff into the lungs every other day.     vitamin B-12 (CYANOCOBALAMIN) 1000 MCG tablet Take 1,000 mcg by mouth daily.      Results for orders placed or performed during the hospital encounter of 08/23/21 (from the past 48 hour(s))  Glucose, capillary     Status: Abnormal   Collection Time: 08/23/21  5:57 AM  Result Value Ref Range   Glucose-Capillary 125 (H) 70 - 99 mg/dL    Comment: Glucose reference range applies only to samples taken after fasting for at least 8 hours.   No results found.  Review of Systems  Constitutional:  Positive for activity change.  HENT: Negative.    Eyes: Negative.   Respiratory: Negative.    Cardiovascular: Negative.   Gastrointestinal: Negative.   Endocrine: Negative.   Musculoskeletal:  Positive for back pain, gait problem and myalgias.  Allergic/Immunologic: Negative.   Neurological:  Positive for weakness and numbness.  Hematological: Negative.   Psychiatric/Behavioral: Negative.     Blood pressure (!) 126/54, pulse 60, temperature 98.5 F (36.9 C), temperature source Oral, resp. rate 17, height 5\' 6"  (1.676 m), weight 88.5 kg, SpO2 97 %. Physical Exam Constitutional:      Appearance: Normal appearance. She is normal weight.  HENT:     Head: Normocephalic and atraumatic.     Right Ear: Tympanic membrane normal.     Left Ear: Tympanic membrane normal.     Nose: Nose normal.     Mouth/Throat:     Mouth: Mucous membranes are moist.  Eyes:     Extraocular Movements: Extraocular movements intact.     Pupils: Pupils are equal, round, and reactive to light.  Cardiovascular:     Rate and Rhythm: Normal rate and regular rhythm.     Pulses: Normal pulses.     Heart sounds: Normal heart sounds.  Pulmonary:     Effort: Pulmonary effort is normal.     Breath sounds: Normal breath  sounds.  Abdominal:     General: Abdomen is flat.     Palpations: Abdomen is soft.  Musculoskeletal:     Cervical back: Normal range of motion.     Comments: Positive straight leg raising at 15 degrees in either lower extremities Patrick's maneuver is negative bilaterally.  Skin:    General: Skin is warm and dry.  Neurological:     Mental Status: She is alert.     Comments: Weakness in tibialis anterior 4 out of 5 on the left side compared to the right side.  Lower  extremity strength otherwise intact iliopsoas quadriceps are normal.  Sensation is intact in the lower extremities.  Upper extremity strength and reflexes are normal.  Cranial nerve examination is normal.  Psychiatric:        Mood and Affect: Mood normal.        Behavior: Behavior normal.        Thought Content: Thought content normal.        Judgment: Judgment normal.     Assessment/Plan Spondylolisthesis L5-S1 with stenosis.  Plan: Decompression fusion L5-S1.  Earleen Newport, MD 08/23/2021, 7:48 AM

## 2021-08-23 NOTE — Evaluation (Signed)
Occupational Therapy Evaluation Patient Details Name: Charlotte [name] MRN: [id number] DOB: 1951/04/12 Today's Date: 08/23/2021   History of Present Illness This 70 yo female is s/p bilateral laminectomies L5-S1, decompression the L5 and S1 nerve roots, L5-S1 fusion.   Clinical Impression   This 70 yo female admitted and underwent above presents to acute OT with PLOF of being totally independent with all basic ADLs and IADLs. Currently due to back pain and back precautions she is setup/S-Max a for basic ADLs and min guard A -min A for mobility. She will continue to benefit from acute OT without need for follow up.     Recommendations for follow up therapy are one component of a multi-disciplinary discharge planning process, led by the attending physician.  Recommendations may be updated based on patient status, additional functional criteria and insurance authorization.   Follow Up Recommendations  No OT follow up;Supervision/Assistance - 24 hour    Equipment Recommendations  3 in 1 bedside commode;Other (comment) (RW)       Precautions / Restrictions Precautions Precautions: Back Precaution Booklet Issued: No Required Braces or Orthoses: Spinal Brace Spinal Brace: Lumbar corset;Applied in sitting position Restrictions Weight Bearing Restrictions: No      Mobility Bed Mobility Overal bed mobility: Needs Assistance Bed Mobility: Rolling;Sit to Sidelying;Sidelying to Sit Rolling: Min guard Sidelying to sit: Min guard     Sit to sidelying: Min guard General bed mobility comments: VCs for sequencing    Transfers Overall transfer level: Needs assistance Equipment used:  (IV pole) Transfers: Sit to/from Stand Sit to Stand: Min assist              Balance Overall balance assessment: Needs assistance Sitting-balance support: No upper extremity supported;Feet supported Sitting balance-Leahy Scale: Good     Standing balance support: Bilateral upper extremity  supported Standing balance-Leahy Scale: Poor                             ADL either performed or assessed with clinical judgement   ADL Overall ADL's : Needs assistance/impaired Eating/Feeding: Independent;Sitting   Grooming: Sitting;Set up   Upper Body Bathing: Set up;Sitting   Lower Body Bathing: Moderate assistance Lower Body Bathing Details (indicate cue type and reason): Min A sit<>stand Upper Body Dressing : Set up;Sitting   Lower Body Dressing: Maximal assistance Lower Body Dressing Details (indicate cue type and reason): min A sit<>stand Toilet Transfer: Minimal assistance;Ambulation;Comfort height toilet;Grab bars Toilet Transfer Details (indicate cue type and reason): pushing IV pole with both hands Toileting- Clothing Manipulation and Hygiene: Minimal assistance;Sit to/from stand               Vision Patient Visual Report: No change from baseline              Pertinent Vitals/Pain Pain Assessment: 0-10 Pain Score: 3  Pain Location: incisional Pain Descriptors / Indicators: Aching;Sore Pain Intervention(s): Limited activity within patient's tolerance;Monitored during session;Repositioned;Premedicated before session     Hand Dominance Right   Extremity/Trunk Assessment Upper Extremity Assessment Upper Extremity Assessment: Overall WFL for tasks assessed           Communication Communication Communication: No difficulties   Cognition Arousal/Alertness: Awake/alert Behavior During Therapy: WFL for tasks assessed/performed Overall Cognitive Status: Within Functional Limits for tasks assessed  Home Living Family/patient expects to be discharged to:: Private residence Living Arrangements: Children Available Help at Discharge: Family;Available 24 hours/day Type of Home: House Home Access: Stairs to enter [employer] of Steps: 4   Home Layout: One level      Bathroom Shower/Tub: Walk-in shower;Door   [employer] Toilet: Standard     Home Equipment: Shower seat;Hand held shower head   Additional Comments: access to a bed rail      Prior Functioning/Environment Level of Independence: Independent                 OT Problem List: Decreased strength;Decreased range of motion;Impaired balance (sitting and/or standing);Pain;Decreased knowledge of use of DME or AE;Decreased knowledge of precautions      OT Treatment/Interventions: Self-care/ADL training;DME and/or AE instruction;Balance training;Patient/family education    OT Goals(Current goals can be found in the care plan section) Acute Rehab OT Goals Patient Stated Goal: to go home tomorrow OT Goal Formulation: With patient/family Time For Goal Achievement: 09/06/21 Potential to Achieve Goals: Good  OT Frequency: Min 2X/week    AM-PAC OT "6 Clicks" Daily Activity     Outcome Measure Help from another person eating meals?: None Help from another person taking care of personal grooming?: A Little Help from another person toileting, which includes using toliet, bedpan, or urinal?: A Little Help from another person bathing (including washing, rinsing, drying)?: A Lot Help from another person to put on and taking off regular upper body clothing?: A Little Help from another person to put on and taking off regular lower body clothing?: A Lot 6 Click Score: 17   End of Session Equipment Utilized During Treatment: Gait belt;Rolling walker Nurse Communication:  (pt needs a RW and 3n1 for home use; pt had shaking throughout my session--not hot, cold or in pain (#3))  Activity Tolerance: Patient tolerated treatment well Patient left: in bed;with call bell/phone within reach;with family/visitor present  OT Visit Diagnosis: Unsteadiness on feet (R26.81);Other abnormalities of gait and mobility (R26.89);Muscle weakness (generalized) (M62.81);Pain Pain - part of body:  (incisional)                 Time: 0233-4356 OT Time Calculation (min): 21 min Charges:  OT General Charges $OT Visit: 1 Visit OT Evaluation $OT Eval Moderate Complexity: 1 Mod  Golden Circle, OTR/L Acute [employer] Pager [phone] Office [phone]    Charlotte [name], Charlotte [name] 08/23/2021, 5:33 PM

## 2021-08-23 NOTE — Anesthesia Procedure Notes (Signed)
Procedure Name: Intubation Date/Time: 08/23/2021 8:12 AM Performed by: Lance Coon, CRNA Pre-anesthesia Checklist: Emergency Drugs available, Patient identified, Suction available, Patient being monitored and Timeout performed Patient Re-evaluated:Patient Re-evaluated prior to induction Oxygen Delivery Method: Circle system utilized Preoxygenation: Pre-oxygenation with 100% oxygen Ventilation: Mask ventilation without difficulty Laryngoscope Size: Miller and 3 Grade View: Grade II Tube type: Oral Tube size: 7.0 mm Number of attempts: 1 Airway Equipment and Method: Stylet Placement Confirmation: ETT inserted through vocal cords under direct vision, positive ETCO2 and breath sounds checked- equal and bilateral Secured at: 22 cm Tube secured with: Tape Dental Injury: Teeth and Oropharynx as per pre-operative assessment

## 2021-08-24 LAB — PREPARE RBC (CROSSMATCH)

## 2021-08-24 LAB — BASIC METABOLIC PANEL
Anion gap: 10 (ref 5–15)
BUN: 23 mg/dL (ref 8–23)
CO2: 25 mmol/L (ref 22–32)
Calcium: 8.5 mg/dL — ABNORMAL LOW (ref 8.9–10.3)
Chloride: 104 mmol/L (ref 98–111)
Creatinine, Ser: 0.94 mg/dL (ref 0.44–1.00)
GFR, Estimated: >60 mL/min (ref >60)
Glucose, Bld: 112 mg/dL — ABNORMAL HIGH (ref 70–99)
Potassium: 2.9 mmol/L — ABNORMAL LOW (ref 3.5–5.1)
Sodium: 139 mmol/L (ref 135–145)

## 2021-08-24 LAB — CBC
HCT: 25 % — ABNORMAL LOW (ref 36.0–46.0)
Hemoglobin: 8.5 g/dL — ABNORMAL LOW (ref 12.0–15.0)
MCH: 30.5 pg (ref 26.0–34.0)
MCHC: 34 g/dL (ref 30.0–36.0)
MCV: 89.6 fL (ref 80.0–100.0)
Platelets: 116 10*3/uL — ABNORMAL LOW (ref 150–400)
RBC: 2.79 MIL/uL — ABNORMAL LOW (ref 3.87–5.11)
RDW: 12.5 % (ref 11.5–15.5)
WBC: 7.7 10*3/uL (ref 4.0–10.5)
nRBC: 0 % (ref 0.0–0.2)

## 2021-08-24 MED ORDER — SODIUM CHLORIDE 0.9% IV SOLUTION: Freq: Once | INTRAVENOUS | Status: AC

## 2021-08-24 MED ORDER — METHOCARBAMOL 500 MG PO TABS: 500.0000 mg | ORAL_TABLET | Freq: Four times a day (QID) | ORAL | 3 refills | Status: AC | PRN

## 2021-08-24 MED ORDER — OXYCODONE-ACETAMINOPHEN 5-325 MG PO TABS
1.0000 | ORAL_TABLET | ORAL | Status: DC | PRN
  Administered 2021-08-24 (×2): 1 via ORAL
  Administered 2021-08-24: 2 via ORAL
  Administered 2021-08-24 – 2021-08-25 (×3): 1 via ORAL
  Filled 2021-08-24: qty 2
  Filled 2021-08-24 (×5): qty 1

## 2021-08-24 MED ORDER — DEXAMETHASONE 1 MG PO TABS: ORAL_TABLET | ORAL | 0 refills | Status: DC

## 2021-08-24 MED ORDER — OXYCODONE-ACETAMINOPHEN 5-325 MG PO TABS: 1.0000 | ORAL_TABLET | ORAL | 0 refills | Status: DC | PRN

## 2021-08-24 MED ORDER — DEXAMETHASONE SODIUM PHOSPHATE 4 MG/ML IJ SOLN
4.0000 mg | Freq: Two times a day (BID) | INTRAMUSCULAR | Status: DC
  Administered 2021-08-24: 4 mg via INTRAVENOUS
  Filled 2021-08-24: qty 1

## 2021-08-24 NOTE — Evaluation (Signed)
Physical Therapy Evaluation  Patient Details Name: Charlotte [name] MRN: [id number] DOB: 07-Jul-1951 Today's Date: 08/24/2021  History of Present Illness  This 70 yo female is s/p bilateral laminectomies L5-S1, decompression the L5 and S1 nerve roots, L5-S1 fusion.  Clinical Impression  Pt admitted with above diagnosis. At the time of PT eval, pt was able to demonstrate transfers and ambulation with up to supervision for safety and RW for support. Pt was educated on precautions, brace application/wearing schedule, appropriate activity progression, and car transfer. Pt currently with functional limitations due to the deficits listed below (see PT Problem List). Pt will benefit from skilled PT to increase their independence and safety with mobility to allow discharge to the venue listed below.         Recommendations for follow up therapy are one component of a multi-disciplinary discharge planning process, led by the attending physician.  Recommendations may be updated based on patient status, additional functional criteria and insurance authorization.  Follow Up Recommendations No PT follow up;Supervision for mobility/OOB    Equipment Recommendations  Rolling walker with 5" wheels    Recommendations for Other Services       Precautions / Restrictions Precautions Precautions: Back Precaution Booklet Issued: Yes (comment) Required Braces or Orthoses: Spinal Brace Spinal Brace: Lumbar corset;Applied in sitting position Restrictions Weight Bearing Restrictions: No      Mobility  Bed Mobility Overal bed mobility: Modified Independent Bed Mobility: Rolling;Sidelying to Sit           General bed mobility comments: HOB flat. Pt was able to transition to EOB without assistance. Good log roll technique.    Transfers Overall transfer level: Needs assistance Equipment used: Rolling walker (2 wheeled) Transfers: Sit to/from Stand Sit to Stand: Supervision         General  transfer comment: Pt demonstrated good hand placement on seated surface for safety. No assist required.  Ambulation/Gait Ambulation/Gait assistance: Supervision Gait Distance (Feet): 560 Feet Assistive device: Rolling walker (2 wheeled) Gait Pattern/deviations: Step-through pattern;Decreased stride length;Trunk flexed Gait velocity: Decreased Gait velocity interpretation: 1.31 - 2.62 ft/sec, indicative of limited community ambulator General Gait Details: VC's for improved posture, closer walker proximity, and forward gaze. No assist required. Mild unsteadiness noted but able to recover without assistance.  Stairs            Wheelchair Mobility    Modified Rankin (Stroke Patients Only)       Balance Overall balance assessment: Mild deficits observed, not formally tested Sitting-balance support: No upper extremity supported;Feet supported Sitting balance-Leahy Scale: Good     Standing balance support: Bilateral upper extremity supported Standing balance-Leahy Scale: Poor                               Pertinent Vitals/Pain Pain Assessment: Faces Pain Score: 3  Faces Pain Scale: Hurts a little bit Pain Location: incisional Pain Descriptors / Indicators: Aching;Sore Pain Intervention(s): Limited activity within patient's tolerance;Monitored during session;Repositioned    Home Living Family/patient expects to be discharged to:: Private residence Living Arrangements: Children Available Help at Discharge: Family;Available 24 hours/day Type of Home: House Home Access: Stairs to enter   [employer] of Steps: 4 Home Layout: One level Home Equipment: Shower seat;Hand held shower head Additional Comments: access to a bed rail    Prior Function Level of Independence: Independent               Hand Dominance  Dominant Hand: Right    Extremity/Trunk Assessment   Upper Extremity Assessment Upper Extremity Assessment: Defer to OT  evaluation    Lower Extremity Assessment Lower Extremity Assessment: Generalized weakness (Consistent with pre-op diagnosis)    Cervical / Trunk Assessment Cervical / Trunk Assessment: Other exceptions Cervical / Trunk Exceptions: s/p surgery  Communication   Communication: No difficulties  Cognition Arousal/Alertness: Awake/alert Behavior During Therapy: WFL for tasks assessed/performed Overall Cognitive Status: Within Functional Limits for tasks assessed                                        General Comments      Exercises     Assessment/Plan    PT Assessment Patient needs continued PT services  PT Problem List Decreased strength;Decreased activity tolerance;Decreased balance;Decreased mobility;Decreased knowledge of use of DME;Decreased safety awareness;Decreased knowledge of precautions;Pain       PT Treatment Interventions DME instruction;Gait training;Functional mobility training;Therapeutic activities;Therapeutic exercise;Neuromuscular re-education;Patient/family education    PT Goals (Current goals can be found in the Care Plan section)  Acute Rehab PT Goals Patient Stated Goal: Get back to walking for exercise PT Goal Formulation: With patient Time For Goal Achievement: 08/31/21 Potential to Achieve Goals: Good    Frequency Min 5X/week   Barriers to discharge        Co-evaluation               AM-PAC PT "6 Clicks" Mobility  Outcome Measure Help needed turning from your back to your side while in a flat bed without using bedrails?: None Help needed moving from lying on your back to sitting on the side of a flat bed without using bedrails?: None Help needed moving to and from a bed to a chair (including a wheelchair)?: A Little Help needed standing up from a chair using your arms (e.g., wheelchair or bedside chair)?: A Little Help needed to walk in hospital room?: A Little Help needed climbing 3-5 steps with a railing? : A Little 6  Click Score: 20    End of Session Equipment Utilized During Treatment: Gait belt Activity Tolerance: Patient tolerated treatment well Patient left: with call bell/phone within reach;in chair;with family/visitor present Nurse Communication: Mobility status PT Visit Diagnosis: Unsteadiness on feet (R26.81);Pain Pain - part of body:  (back)    Time: 2956-2130 PT Time Calculation (min) (ACUTE ONLY): 18 min   Charges:   PT Evaluation $PT Eval Low Complexity: 1 Low          Rolinda Roan, PT, DPT Acute Rehabilitation Services Pager: [phone] Office: [phone]   Thelma Comp 08/24/2021, 1:53 PM

## 2021-08-24 NOTE — Progress Notes (Signed)
Patient ID: Charlotte [name], female   DOB: 01/28/1951, 70 y.o.   MRN: [id number] Vital signs are stable.  Patient having a substantial amount of back pain and pain across her hips today.  She does have her hematocrit down to 25.  I have advised 2 units of packed cell transfusion.  She would like to be discharged tomorrow and I believe that once the transfusion is then she will feel considerably better.  Also start her on a low-dose of Decadron.  Her incision is clean and dry.

## 2021-08-24 NOTE — Progress Notes (Signed)
Occupational Therapy Treatment Patient Details Name: Charlotte [name] MRN: [id number] DOB: November 20, 1950 Today's Date: 08/24/2021   History of present illness This 70 yo female is s/p bilateral laminectomies L5-S1, decompression the L5 and S1 nerve roots, L5-S1 fusion.   OT comments  This 70 yo female seen today to educate on AE use, pt able to return demonstrate use of AE for socks and underwear. Gave pt handout on AE kit so she knows what to order. No further OT needs, we will sign off.   Recommendations for follow up therapy are one component of a multi-disciplinary discharge planning process, led by the attending physician.  Recommendations may be updated based on patient status, additional functional criteria and insurance authorization.    Follow Up Recommendations  No OT follow up;Supervision/Assistance - 24 hour    Equipment Recommendations  3 in 1 bedside commode;Other (comment) (RW)       Precautions / Restrictions Precautions Precautions: Back Precaution Booklet Issued: Yes (comment) Required Braces or Orthoses: Spinal Brace Spinal Brace: Lumbar corset;Applied in sitting position Restrictions Weight Bearing Restrictions: No       Mobility Bed Mobility Overal bed mobility: Modified Independent                  Transfers Overall transfer level: Needs assistance Equipment used: Rolling walker (2 wheeled) Transfers: Sit to/from Stand Sit to Stand: Supervision              Balance Overall balance assessment: Mild deficits observed, not formally tested                                         ADL either performed or assessed with clinical judgement   ADL Overall ADL's : Needs assistance/impaired                     Lower Body Dressing: Supervision/safety;Set up;Sit to/from stand;Adhering to back precautions;With adaptive equipment                       Vision Patient Visual Report: No change from baseline             Cognition Arousal/Alertness: Awake/alert Behavior During Therapy: WFL for tasks assessed/performed Overall Cognitive Status: Within Functional Limits for tasks assessed                                                     Pertinent Vitals/ Pain       Pain Assessment: Faces Pain Score: 3  Pain Location: incisional Pain Descriptors / Indicators: Aching;Sore Pain Intervention(s): Limited activity within patient's tolerance;Monitored during session;Repositioned         Frequency  Min 2X/week        Progress Toward Goals  OT Goals(current goals can now be found in the care plan section)  Progress towards OT goals: Goals met/education completed, patient discharged from OT  Acute Rehab OT Goals Patient Stated Goal: to go home today (but is delayed due to needing blood)  Plan All goals met and education completed, patient discharged from OT services       AM-PAC OT "6 Clicks" Daily Activity     Outcome Measure   Help from another person eating meals?: None  Help from another person taking care of personal grooming?: A Little (S) Help from another person toileting, which includes using toliet, bedpan, or urinal?: A Little (S) Help from another person bathing (including washing, rinsing, drying)?: A Little (S) Help from another person to put on and taking off regular upper body clothing?: A Little (S) Help from another person to put on and taking off regular lower body clothing?: A Little (S) 6 Click Score: 19    End of Session Equipment Utilized During Treatment: Rolling walker  OT Visit Diagnosis: Unsteadiness on feet (R26.81);Pain Pain - part of body:  (incisional)   Activity Tolerance Patient tolerated treatment well   Patient Left in bed;with call bell/phone within reach;with family/visitor present   Nurse Communication  (3n1, RW, preferd [facility] if needs it)        Time: 7939-6886 OT Time Calculation (min): 21  min  Charges: OT General Charges $OT Visit: 1 Visit OT Treatments $Self Care/Home Management : 8-22 mins  [place]en Circle, OTR/L Acute [employer] Pager [phone] Office [phone]    Charlotte [name], Charlotte [name] 08/24/2021, 10:09 AM

## 2021-08-24 NOTE — Discharge Summary (Signed)
Physician Discharge Summary  Patient ID: Charlotte [name] MRN: [id number] DOB/AGE: 1951/04/03 70 y.o.  Admit date: 08/23/2021 Discharge date: 08/24/2021  Admission Diagnoses: Spondylolisthesis L5-S1 with lumbar radiculopathy.  Lumbar stenosis.  Discharge Diagnoses: Lumbar spondylolisthesis L5-S1 with lumbar radiculopathy.  Lumbar stenosis.  Neurogenic claudication.  Acute blood loss anemia Active Problems:   Lumbar stenosis with neurogenic claudication   Discharged Condition: good  Hospital Course: Patient was admitted to undergo surgical decompression which she tolerated well.  She did have acute blood loss anemia and required some transfusion.  Consults: None  Significant Diagnostic Studies: None  Treatments: surgery: See op note  Discharge Exam: Blood pressure (!) 118/51, pulse 69, temperature 99.6 F (37.6 C), temperature source Oral, resp. rate 18, height 5\' 6"  (1.676 m), weight 88.5 kg, SpO2 94 %. Incision is clean and dry motor function is intact  Disposition: Discharge disposition: 01-Home or Self Care       Discharge Instructions     Call MD for:  redness, tenderness, or signs of infection (pain, swelling, redness, odor or green/yellow discharge around incision site)   Complete by: As directed    Call MD for:  severe uncontrolled pain   Complete by: As directed    Call MD for:  temperature >100.4   Complete by: As directed    Diet - low sodium heart healthy   Complete by: As directed    Discharge wound care:   Complete by: As directed    Okay to shower. Do not apply salves or appointments to incision. No heavy lifting with the upper extremities greater than 10 pounds. May resume driving when not requiring pain medication and patient feels comfortable with doing so.   Incentive spirometry RT   Complete by: As directed    Increase activity slowly   Complete by: As directed       Allergies as of 08/24/2021   No Known Allergies      Medication  List     TAKE these medications    albuterol 108 (90 Base) MCG/ACT inhaler Commonly known as: VENTOLIN HFA Inhale 2 puffs into the lungs every 6 (six) hours as needed for wheezing or shortness of breath.   amLODipine 5 MG tablet Commonly known as: NORVASC TAKE 1 TABLET BY MOUTH  DAILY   aspirin 81 MG tablet Take 81 mg by mouth daily.   dexamethasone 1 MG tablet Commonly known as: DECADRON 2 tablets twice daily for 2 days, one tablet twice daily for 2 days, one tablet daily for 2 days.   donepezil 10 MG tablet Commonly known as: ARICEPT Take 10 mg by mouth at bedtime.   furosemide 20 MG tablet Commonly known as: LASIX Take 1 tablet (20 mg total) by mouth daily. NEED OV.   levothyroxine 50 MCG tablet Commonly known as: SYNTHROID Take 50 mcg by mouth daily.   lisinopril-hydrochlorothiazide 20-25 MG tablet Commonly known as: ZESTORETIC Take 1 tablet by mouth daily.   methocarbamol 500 MG tablet Commonly known as: ROBAXIN Take 1 tablet (500 mg total) by mouth every 6 (six) hours as needed for muscle spasms.   oxyCODONE-acetaminophen 5-325 MG tablet Commonly known as: PERCOCET/ROXICET Take 1-2 tablets by mouth every 4 (four) hours as needed for severe pain (br).   rOPINIRole 0.5 MG tablet Commonly known as: REQUIP Take 0.5 mg by mouth at bedtime.   rosuvastatin 20 MG tablet Commonly known as: CRESTOR TAKE 1 TABLET BY MOUTH  DAILY (KEEP OFFICE VISIT)   sertraline 100 MG tablet  Commonly known as: ZOLOFT Take 100 mg by mouth daily.   traMADol 50 MG tablet Commonly known as: ULTRAM Take 50 mg by mouth every 6 (six) hours as needed for severe pain.   Trelegy Ellipta 100-62.5-25 MCG/INH Aepb Generic drug: Fluticasone-Umeclidin-Vilant Inhale 1 puff into the lungs every other day.   vitamin B-12 1000 MCG tablet Commonly known as: CYANOCOBALAMIN Take 1,000 mcg by mouth daily.               Discharge Care Instructions  (From admission, onward)            Start     Ordered   08/24/21 0000  Discharge wound care:       Comments: Okay to shower. Do not apply salves or appointments to incision. No heavy lifting with the upper extremities greater than 10 pounds. May resume driving when not requiring pain medication and patient feels comfortable with doing so.   08/24/21 1847             Signed: Blanchie Dessert Emori Kamau 08/24/2021, 6:48 PM

## 2021-08-25 LAB — HEMOGLOBIN AND HEMATOCRIT, BLOOD
HCT: 33.6 % — ABNORMAL LOW (ref 36.0–46.0)
Hemoglobin: 11.6 g/dL — ABNORMAL LOW (ref 12.0–15.0)

## 2021-08-25 LAB — BPAM RBC
Blood Product Expiration Date: 202210302359
Blood Product Expiration Date: 202210302359
ISSUE DATE / TIME: 202210071834
ISSUE DATE / TIME: 202210072200
Unit Type and Rh: 6200
Unit Type and Rh: 6200

## 2021-08-25 LAB — TYPE AND SCREEN
ABO/RH(D): A POS
Antibody Screen: NEGATIVE
Unit division: 0
Unit division: 0

## 2021-08-25 MED ORDER — DEXAMETHASONE 4 MG PO TABS
4.0000 mg | ORAL_TABLET | Freq: Once | ORAL | Status: AC
  Administered 2021-08-25: 4 mg via ORAL
  Filled 2021-08-25: qty 1

## 2021-08-25 MED ORDER — DIPHENHYDRAMINE HCL 25 MG PO CAPS
25.0000 mg | ORAL_CAPSULE | ORAL | Status: DC | PRN
  Administered 2021-08-25: 25 mg via ORAL
  Filled 2021-08-25: qty 1

## 2021-08-25 NOTE — Progress Notes (Signed)
Patient was transported via wheelchair by NT for discharge home; in no acute distress nor complaints of pain nor discomfort; incision on her back skin glued was clean, dry and intact; room was checked and accounted for all her belongings with 2 DME equipments carried along by daughter as she picked the car. Discharge instructions concerning her wound care, medications, follow-up appointment and when to call the doctor were all discussed with the patient and her daughter by RN and both verbalized understanding on the instructions given.

## 2021-08-25 NOTE — Discharge Instructions (Addendum)
Wound Care Leave incision open to air. You may shower. Do not scrub directly on incision.  Do not put any creams, lotions, or ointments on incision.  Activity Walk each and every day, increasing distance each day. No lifting greater than 5 lbs.  Avoid bending, arching, and twisting. No driving for 2 weeks; may ride as a passenger locally. If provided with back brace, wear when out of bed.  It is not necessary to wear in bed. Diet Resume your normal diet.    Call Your Doctor If Any of These Occur Redness, drainage, or swelling at the wound.  Temperature greater than 101 degrees. Severe pain not relieved by pain medication. Incision starts to come apart. Follow Up Appt Call today for appointment in 3 weeks ([phone]) or for problems.  If you have any hardware placed in your spine, you will need an x-ray before your appointment.

## 2021-08-25 NOTE — Discharge Summary (Signed)
Patient doing very well this morning.  No real pain.  Walking well.  Good strength.  Dressing dry.  Will discharge this morning.  See details on discharge summary dictated last evening

## 2021-08-25 NOTE — Progress Notes (Signed)
Patient has greatly improved from her rash after the administration of P.O. Benadryl and stated she is ready to go home. Her skin on both arms and legs were almost cleared but with visible markings from where it erupted. No acute complaints of itching nor discomfort at the moment.

## 2021-08-25 NOTE — Progress Notes (Signed)
Physical Therapy Treatment and Discharge Patient Details Name: Charlotte [name] MRN: [id number] DOB: 12/01/1950 Today's Date: 08/25/2021   History of Present Illness This 70 yo female is s/p bilateral laminectomies L5-S1, decompression the L5 and S1 nerve roots, L5-S1 fusion.    PT Comments    Pt reporting overall good pain control and is moving well. Ambulating hallway distances with a walker and negotiated 4 steps without physical difficulty. Education reviewed regarding spinal precautions and activity recommendations. No further acute PT needs. Thank you for this consult.    Recommendations for follow up therapy are one component of a multi-disciplinary discharge planning process, led by the attending physician.  Recommendations may be updated based on patient status, additional functional criteria and insurance authorization.  Follow Up Recommendations  No PT follow up;Supervision for mobility/OOB     Equipment Recommendations  Rolling walker with 5" wheels    Recommendations for Other Services       Precautions / Restrictions Precautions Precautions: Back Precaution Booklet Issued: Yes (comment) Precaution Comments: recalls 2/3 Required Braces or Orthoses: Spinal Brace Spinal Brace: Lumbar corset;Applied in sitting position Restrictions Weight Bearing Restrictions: No     Mobility  Bed Mobility Overal bed mobility: Modified Independent             General bed mobility comments: HOB flat. Pt was able to transition to EOB without assistance. Good log roll technique.    Transfers Overall transfer level: Modified independent Equipment used: Rolling walker (2 wheeled)                Ambulation/Gait Ambulation/Gait assistance: Modified independent (Device/Increase time) Gait Distance (Feet): 400 Feet Assistive device: Rolling walker (2 wheeled) Gait Pattern/deviations: Step-through pattern;Decreased stride length;Trunk flexed Gait velocity:  Decreased   General Gait Details: VC's for upright posture and upward gaze, good carryover   Stairs Stairs: Yes Stairs assistance: Min guard Stair Management: One rail Right Number of Stairs: 4 General stair comments: cues for step by step   Wheelchair Mobility    Modified Rankin (Stroke Patients Only)       Balance Overall balance assessment: Mild deficits observed, not formally tested                                          Cognition Arousal/Alertness: Awake/alert Behavior During Therapy: WFL for tasks assessed/performed Overall Cognitive Status: Within Functional Limits for tasks assessed                                        Exercises      General Comments        Pertinent Vitals/Pain Pain Assessment: Faces Faces Pain Scale: Hurts a little bit Pain Location: incisional Pain Descriptors / Indicators: Aching;Sore Pain Intervention(s): Monitored during session    Home Living Family/patient expects to be discharged to:: Private residence Living Arrangements: Children Available Help at Discharge: Family;Available 24 hours/day Type of Home: House Home Access: Stairs to enter   Home Layout: One level Home Equipment: Shower seat;Hand held shower head Additional Comments: access to a bed rail    Prior Function Level of Independence: Independent          PT Goals (current goals can now be found in the care plan section) Acute Rehab PT Goals Patient Stated Goal: Get back to   walking for exercise PT Goal Formulation: With patient Time For Goal Achievement: 08/31/21 Potential to Achieve Goals: Good Progress towards PT goals: Goals met/education completed, patient discharged from PT    Frequency    Min 5X/week      PT Plan Other (comment) (d/c therapies)    Co-evaluation              AM-PAC PT "6 Clicks" Mobility   Outcome Measure  Help needed turning from your back to your side while in a flat bed  without using bedrails?: None Help needed moving from lying on your back to sitting on the side of a flat bed without using bedrails?: None Help needed moving to and from a bed to a chair (including a wheelchair)?: None Help needed standing up from a chair using your arms (e.g., wheelchair or bedside chair)?: None Help needed to walk in hospital room?: None Help needed climbing 3-5 steps with a railing? : A Little 6 Click Score: 23    End of Session Equipment Utilized During Treatment: Back brace Activity Tolerance: Patient tolerated treatment well Patient left: with call bell/phone within reach;in chair;with family/visitor present Nurse Communication: Mobility status PT Visit Diagnosis: Unsteadiness on feet (R26.81);Pain Pain - part of body:  (back)     Time: 0820-0833 PT Time Calculation (min) (ACUTE ONLY): 13 min  Charges:  $Gait Training: 8-22 mins                      Brown, PT, DPT Acute Rehabilitation Services Pager [phone] Office [phone]    Carloine T Brown 08/25/2021, 8:52 AM  

## 2021-08-25 NOTE — Progress Notes (Signed)
Patient complained of developing raised rash on both arms and legs around 8 this morning. Of note, she received 2 units of PRBC's the night before. MD on call, Dr. Reatha Armour was paged and notified and came up the unit to see the patient himself. The MD explained to patient and her daughter that the rash might be a delayed reaction from the blood transfusion she had received. The rash had greatly improved from when it first erupted a few hours ago. MD ordered Benadryl 25 mg P.O. and to observe patient for a couple of hours. If symptom improved, patient can be discharge home as planned and if no improvement, she will be transferred to another unit for further evaluation and observation. RN administered oral Benadryl as ordered. Will continue to monitor patient.

## 2021-08-27 DIAGNOSIS — Z48811 Encounter for surgical aftercare following surgery on the nervous system: Secondary | ICD-10-CM | POA: Diagnosis not present

## 2021-08-27 DIAGNOSIS — I10 Essential (primary) hypertension: Secondary | ICD-10-CM | POA: Diagnosis not present

## 2021-08-27 DIAGNOSIS — G3184 Mild cognitive impairment, so stated: Secondary | ICD-10-CM | POA: Diagnosis not present

## 2021-08-27 DIAGNOSIS — G2581 Restless legs syndrome: Secondary | ICD-10-CM | POA: Diagnosis not present

## 2021-08-27 DIAGNOSIS — M4180 Other forms of scoliosis, site unspecified: Secondary | ICD-10-CM | POA: Diagnosis not present

## 2021-08-27 DIAGNOSIS — E119 Type 2 diabetes mellitus without complications: Secondary | ICD-10-CM | POA: Diagnosis not present

## 2021-08-27 DIAGNOSIS — M5416 Radiculopathy, lumbar region: Secondary | ICD-10-CM | POA: Diagnosis not present

## 2021-08-27 DIAGNOSIS — E039 Hypothyroidism, unspecified: Secondary | ICD-10-CM | POA: Diagnosis not present

## 2021-08-27 DIAGNOSIS — Z7982 Long term (current) use of aspirin: Secondary | ICD-10-CM | POA: Diagnosis not present

## 2021-08-27 DIAGNOSIS — I5032 Chronic diastolic (congestive) heart failure: Secondary | ICD-10-CM | POA: Diagnosis not present

## 2021-08-29 DIAGNOSIS — G2581 Restless legs syndrome: Secondary | ICD-10-CM | POA: Diagnosis not present

## 2021-08-29 DIAGNOSIS — Z7982 Long term (current) use of aspirin: Secondary | ICD-10-CM | POA: Diagnosis not present

## 2021-08-29 DIAGNOSIS — E119 Type 2 diabetes mellitus without complications: Secondary | ICD-10-CM | POA: Diagnosis not present

## 2021-08-29 DIAGNOSIS — Z48811 Encounter for surgical aftercare following surgery on the nervous system: Secondary | ICD-10-CM | POA: Diagnosis not present

## 2021-08-29 DIAGNOSIS — I5032 Chronic diastolic (congestive) heart failure: Secondary | ICD-10-CM | POA: Diagnosis not present

## 2021-08-29 DIAGNOSIS — I10 Essential (primary) hypertension: Secondary | ICD-10-CM | POA: Diagnosis not present

## 2021-08-29 DIAGNOSIS — M4180 Other forms of scoliosis, site unspecified: Secondary | ICD-10-CM | POA: Diagnosis not present

## 2021-08-29 DIAGNOSIS — G3184 Mild cognitive impairment, so stated: Secondary | ICD-10-CM | POA: Diagnosis not present

## 2021-08-29 DIAGNOSIS — E039 Hypothyroidism, unspecified: Secondary | ICD-10-CM | POA: Diagnosis not present

## 2021-08-29 DIAGNOSIS — M5416 Radiculopathy, lumbar region: Secondary | ICD-10-CM | POA: Diagnosis not present

## 2021-08-31 DIAGNOSIS — M5416 Radiculopathy, lumbar region: Secondary | ICD-10-CM | POA: Diagnosis not present

## 2021-08-31 DIAGNOSIS — E039 Hypothyroidism, unspecified: Secondary | ICD-10-CM | POA: Diagnosis not present

## 2021-08-31 DIAGNOSIS — I10 Essential (primary) hypertension: Secondary | ICD-10-CM | POA: Diagnosis not present

## 2021-08-31 DIAGNOSIS — M4180 Other forms of scoliosis, site unspecified: Secondary | ICD-10-CM | POA: Diagnosis not present

## 2021-08-31 DIAGNOSIS — G2581 Restless legs syndrome: Secondary | ICD-10-CM | POA: Diagnosis not present

## 2021-08-31 DIAGNOSIS — I5032 Chronic diastolic (congestive) heart failure: Secondary | ICD-10-CM | POA: Diagnosis not present

## 2021-08-31 DIAGNOSIS — E119 Type 2 diabetes mellitus without complications: Secondary | ICD-10-CM | POA: Diagnosis not present

## 2021-08-31 DIAGNOSIS — Z7982 Long term (current) use of aspirin: Secondary | ICD-10-CM | POA: Diagnosis not present

## 2021-08-31 DIAGNOSIS — Z48811 Encounter for surgical aftercare following surgery on the nervous system: Secondary | ICD-10-CM | POA: Diagnosis not present

## 2021-08-31 DIAGNOSIS — G3184 Mild cognitive impairment, so stated: Secondary | ICD-10-CM | POA: Diagnosis not present

## 2021-09-03 DIAGNOSIS — I5032 Chronic diastolic (congestive) heart failure: Secondary | ICD-10-CM | POA: Diagnosis not present

## 2021-09-03 DIAGNOSIS — G3184 Mild cognitive impairment, so stated: Secondary | ICD-10-CM | POA: Diagnosis not present

## 2021-09-03 DIAGNOSIS — E039 Hypothyroidism, unspecified: Secondary | ICD-10-CM | POA: Diagnosis not present

## 2021-09-03 DIAGNOSIS — Z7982 Long term (current) use of aspirin: Secondary | ICD-10-CM | POA: Diagnosis not present

## 2021-09-03 DIAGNOSIS — M4180 Other forms of scoliosis, site unspecified: Secondary | ICD-10-CM | POA: Diagnosis not present

## 2021-09-03 DIAGNOSIS — G2581 Restless legs syndrome: Secondary | ICD-10-CM | POA: Diagnosis not present

## 2021-09-03 DIAGNOSIS — E119 Type 2 diabetes mellitus without complications: Secondary | ICD-10-CM | POA: Diagnosis not present

## 2021-09-03 DIAGNOSIS — Z48811 Encounter for surgical aftercare following surgery on the nervous system: Secondary | ICD-10-CM | POA: Diagnosis not present

## 2021-09-03 DIAGNOSIS — M5416 Radiculopathy, lumbar region: Secondary | ICD-10-CM | POA: Diagnosis not present

## 2021-09-03 DIAGNOSIS — I10 Essential (primary) hypertension: Secondary | ICD-10-CM | POA: Diagnosis not present

## 2021-09-05 DIAGNOSIS — I10 Essential (primary) hypertension: Secondary | ICD-10-CM | POA: Diagnosis not present

## 2021-09-05 DIAGNOSIS — E119 Type 2 diabetes mellitus without complications: Secondary | ICD-10-CM | POA: Diagnosis not present

## 2021-09-05 DIAGNOSIS — I5032 Chronic diastolic (congestive) heart failure: Secondary | ICD-10-CM | POA: Diagnosis not present

## 2021-09-05 DIAGNOSIS — G3184 Mild cognitive impairment, so stated: Secondary | ICD-10-CM | POA: Diagnosis not present

## 2021-09-05 DIAGNOSIS — E039 Hypothyroidism, unspecified: Secondary | ICD-10-CM | POA: Diagnosis not present

## 2021-09-05 DIAGNOSIS — G2581 Restless legs syndrome: Secondary | ICD-10-CM | POA: Diagnosis not present

## 2021-09-05 DIAGNOSIS — M5416 Radiculopathy, lumbar region: Secondary | ICD-10-CM | POA: Diagnosis not present

## 2021-09-05 DIAGNOSIS — M4180 Other forms of scoliosis, site unspecified: Secondary | ICD-10-CM | POA: Diagnosis not present

## 2021-09-05 DIAGNOSIS — Z7982 Long term (current) use of aspirin: Secondary | ICD-10-CM | POA: Diagnosis not present

## 2021-09-05 DIAGNOSIS — Z48811 Encounter for surgical aftercare following surgery on the nervous system: Secondary | ICD-10-CM | POA: Diagnosis not present

## 2021-09-07 DIAGNOSIS — M5416 Radiculopathy, lumbar region: Secondary | ICD-10-CM | POA: Diagnosis not present

## 2021-09-07 DIAGNOSIS — I10 Essential (primary) hypertension: Secondary | ICD-10-CM | POA: Diagnosis not present

## 2021-09-07 DIAGNOSIS — E119 Type 2 diabetes mellitus without complications: Secondary | ICD-10-CM | POA: Diagnosis not present

## 2021-09-07 DIAGNOSIS — Z48811 Encounter for surgical aftercare following surgery on the nervous system: Secondary | ICD-10-CM | POA: Diagnosis not present

## 2021-09-07 DIAGNOSIS — M4180 Other forms of scoliosis, site unspecified: Secondary | ICD-10-CM | POA: Diagnosis not present

## 2021-09-07 DIAGNOSIS — G2581 Restless legs syndrome: Secondary | ICD-10-CM | POA: Diagnosis not present

## 2021-09-07 DIAGNOSIS — G3184 Mild cognitive impairment, so stated: Secondary | ICD-10-CM | POA: Diagnosis not present

## 2021-09-07 DIAGNOSIS — I5032 Chronic diastolic (congestive) heart failure: Secondary | ICD-10-CM | POA: Diagnosis not present

## 2021-09-07 DIAGNOSIS — E039 Hypothyroidism, unspecified: Secondary | ICD-10-CM | POA: Diagnosis not present

## 2021-09-07 DIAGNOSIS — Z7982 Long term (current) use of aspirin: Secondary | ICD-10-CM | POA: Diagnosis not present

## 2021-09-25 DIAGNOSIS — I350 Nonrheumatic aortic (valve) stenosis: Secondary | ICD-10-CM | POA: Diagnosis not present

## 2021-09-25 DIAGNOSIS — E782 Mixed hyperlipidemia: Secondary | ICD-10-CM | POA: Diagnosis not present

## 2021-09-25 DIAGNOSIS — I5030 Unspecified diastolic (congestive) heart failure: Secondary | ICD-10-CM | POA: Diagnosis not present

## 2021-09-25 DIAGNOSIS — R7301 Impaired fasting glucose: Secondary | ICD-10-CM | POA: Diagnosis not present

## 2021-09-25 DIAGNOSIS — G4733 Obstructive sleep apnea (adult) (pediatric): Secondary | ICD-10-CM | POA: Diagnosis not present

## 2021-09-25 DIAGNOSIS — J45909 Unspecified asthma, uncomplicated: Secondary | ICD-10-CM | POA: Diagnosis not present

## 2021-09-25 DIAGNOSIS — E039 Hypothyroidism, unspecified: Secondary | ICD-10-CM | POA: Diagnosis not present

## 2021-09-25 DIAGNOSIS — Z23 Encounter for immunization: Secondary | ICD-10-CM | POA: Diagnosis not present

## 2021-09-25 DIAGNOSIS — R413 Other amnesia: Secondary | ICD-10-CM | POA: Diagnosis not present

## 2021-09-25 DIAGNOSIS — Z0000 Encounter for general adult medical examination without abnormal findings: Secondary | ICD-10-CM | POA: Diagnosis not present

## 2021-09-25 DIAGNOSIS — I110 Hypertensive heart disease with heart failure: Secondary | ICD-10-CM | POA: Diagnosis not present

## 2021-10-02 ENCOUNTER — Other Ambulatory Visit: Payer: Self-pay | Admitting: Family Medicine

## 2021-10-02 DIAGNOSIS — R5381 Other malaise: Secondary | ICD-10-CM

## 2021-10-03 ENCOUNTER — Other Ambulatory Visit: Payer: Self-pay | Admitting: Family Medicine

## 2021-10-03 DIAGNOSIS — Z6830 Body mass index (BMI) 30.0-30.9, adult: Secondary | ICD-10-CM | POA: Insufficient documentation

## 2021-10-03 DIAGNOSIS — M5416 Radiculopathy, lumbar region: Secondary | ICD-10-CM | POA: Diagnosis not present

## 2021-10-03 DIAGNOSIS — Z13820 Encounter for screening for osteoporosis: Secondary | ICD-10-CM

## 2021-10-19 ENCOUNTER — Ambulatory Visit: Payer: Medicare Other | Admitting: Psychology

## 2021-10-19 ENCOUNTER — Other Ambulatory Visit: Payer: Self-pay

## 2021-10-19 ENCOUNTER — Ambulatory Visit (INDEPENDENT_AMBULATORY_CARE_PROVIDER_SITE_OTHER): Payer: Medicare Other | Admitting: Psychology

## 2021-10-19 ENCOUNTER — Encounter: Payer: Self-pay | Admitting: Psychology

## 2021-10-19 DIAGNOSIS — R4189 Other symptoms and signs involving cognitive functions and awareness: Secondary | ICD-10-CM

## 2021-10-19 DIAGNOSIS — E2839 Other primary ovarian failure: Secondary | ICD-10-CM | POA: Insufficient documentation

## 2021-10-19 DIAGNOSIS — M4126 Other idiopathic scoliosis, lumbar region: Secondary | ICD-10-CM | POA: Insufficient documentation

## 2021-10-19 DIAGNOSIS — G2581 Restless legs syndrome: Secondary | ICD-10-CM | POA: Insufficient documentation

## 2021-10-19 DIAGNOSIS — G3184 Mild cognitive impairment, so stated: Secondary | ICD-10-CM

## 2021-10-19 DIAGNOSIS — G4700 Insomnia, unspecified: Secondary | ICD-10-CM | POA: Insufficient documentation

## 2021-10-19 DIAGNOSIS — K219 Gastro-esophageal reflux disease without esophagitis: Secondary | ICD-10-CM | POA: Insufficient documentation

## 2021-10-19 DIAGNOSIS — M51369 Other intervertebral disc degeneration, lumbar region without mention of lumbar back pain or lower extremity pain: Secondary | ICD-10-CM | POA: Insufficient documentation

## 2021-10-19 DIAGNOSIS — M5136 Other intervertebral disc degeneration, lumbar region: Secondary | ICD-10-CM | POA: Insufficient documentation

## 2021-10-19 DIAGNOSIS — G629 Polyneuropathy, unspecified: Secondary | ICD-10-CM | POA: Insufficient documentation

## 2021-10-19 DIAGNOSIS — E049 Nontoxic goiter, unspecified: Secondary | ICD-10-CM | POA: Insufficient documentation

## 2021-10-19 DIAGNOSIS — F329 Major depressive disorder, single episode, unspecified: Secondary | ICD-10-CM | POA: Insufficient documentation

## 2021-10-19 DIAGNOSIS — I359 Nonrheumatic aortic valve disorder, unspecified: Secondary | ICD-10-CM | POA: Insufficient documentation

## 2021-10-19 DIAGNOSIS — R7301 Impaired fasting glucose: Secondary | ICD-10-CM | POA: Insufficient documentation

## 2021-10-19 DIAGNOSIS — M1990 Unspecified osteoarthritis, unspecified site: Secondary | ICD-10-CM | POA: Insufficient documentation

## 2021-10-19 NOTE — Progress Notes (Signed)
   Psychometrician Note   Cognitive testing was administered to Charlotte [name] by Milana Kidney, B.S. (psychometrist) under the supervision of Dr. Christia Reading, Ph.D., licensed psychologist on 10/19/21. Ms. Bhandari did not appear overtly distressed by the testing session per behavioral observation or responses across self-report questionnaires. Rest breaks were offered.    The battery of tests administered was selected by Dr. Christia Reading, Ph.D. with consideration to [name]'s current level of functioning, the nature of her symptoms, emotional and behavioral responses during interview, level of literacy, observed level of motivation/effort, and the nature of the referral question. This battery was communicated to the psychometrist. Communication between Dr. Christia Reading, Ph.D. and the psychometrist was ongoing throughout the evaluation and Dr. Christia Reading, Ph.D. was immediately accessible at all times. Dr. Christia Reading, Ph.D. provided supervision to the psychometrist on the date of this service to the extent necessary to assure the quality of all services provided.    Charlotte [name] will return within approximately 1-2 weeks for an interactive feedback session with Dr. Melvyn Novas at which time her test performances, clinical impressions, and treatment recommendations will be reviewed in detail. [name] understands she can contact our office should she require our assistance before this time.  A total of 125 minutes of billable time were spent face-to-face with [name] by the psychometrist. This includes both test administration and scoring time. Billing for these services is reflected in the clinical report generated by Dr. Christia Reading, Ph.D.  This note reflects time spent with the psychometrician and does not include test scores or any clinical interpretations made by Dr. Melvyn Novas. The full report will follow in a separate note.

## 2021-10-19 NOTE — Progress Notes (Signed)
NEUROPSYCHOLOGICAL EVALUATION Charlotte. [name] Department of Neurology  Date of Evaluation: October 19, 2021  Reason for Referral:   Charlotte [name] is a 70 y.o. right-handed Caucasian female referred by  Charlotte Butters, PA-C , to characterize her current cognitive functioning and assist with diagnostic clarity and treatment planning in the context of subjective cognitive decline.   Assessment and Plan:   Clinical Impression(s): [name] pattern of performance is suggestive of severe impairment surrounding all aspects of verbal memory, as well as verbal fluency (phonemic somewhat worse than semantic). Performance variability but generally below expectation performances were also exhibited across the domain of executive functioning. Given premorbid intellectual estimations, performances are believed to be appropriate across domains of processing speed, attention/concentration, receptive language, confrontation naming, visuospatial abilities, and all aspects of visual memory. [name] was previously diagnosed with a mild dementia in 2019. However, it is my opinion, based upon past record review, that criteria surrounding ADL dysfunction to warrant this diagnosis was not met at that time. Regardless, [name] has continued to live alone and denied any significant ADL dysfunction currently. 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  Relative to her previous evaluation in 2019, nearly all cognitive performances were stable. Subtle decline was seen across one memory task (list learning), as well as aspects of both phonemic and semantic fluency. However, outside of this, no clinically significant decline was captured between her previous evaluation and current test performances.   The etiology for ongoing impairment is unclear.  Recent neuroimaging did not reveal any significant vascular risk factors and [name] does not demonstrate strong behavioral features of Lewy body disease or frontotemporal lobar degeneration, making vascular dementia, Lewy body dementia, and frontotemporal dementia unlikely. Given significant impairment across verbal memory, continued considerations surrounding Alzheimer's disease as the underlying cause are certainly warranted, especially in the absence of a viable alternative. With that being said, it is also worth highlighting that [name]'s profile is not consistent with typically presenting Alzheimer's disease. Namely, she exhibited strong visual memory performance, both in 2019 and across the current evaluation. She also exhibited intact confrontation naming and visuospatial abilities across both evaluations, as well as a verbal fluency discrepancy (phonemic worse than semantic) opposite of what would be typically expected. Finally, she exhibited very minimal decline over the past 3.5 years in between evaluations. Overall, while Alzheimer's disease continues to be plausible, the relative stability of her profile is certainly encouraging and would suggest very slow disease progression if truly present. Continued medical monitoring will be important moving forward.  Recommendations: A repeat neuropsychological evaluation in 24 months (or sooner if functional decline is noted) is recommended to assess the trajectory of future cognitive decline should it occur. This will also aid in future efforts towards improved diagnostic clarity.  [name] has already been prescribed medication (i.e., donepezil/Aricept) which is commonly given to individuals with memory impairment and concerns surrounding Alzheimer's disease. She is encouraged to continue taking this medication as prescribed. It is important to highlight that this medication has been shown to slow functional decline in some individuals. There  is no current treatment which can stop or reverse cognitive decline.   Should there be a progression of current deficits over time, [name] is unlikely to regain any independent living skills lost. Therefore, it is recommended that she remain as involved as possible in  all aspects of household chores, finances, and medication management, with supervision to ensure adequate performance. She will likely benefit from the establishment and maintenance of a routine in order to maximize her functional abilities over time.  It will be important for [name] to have another person with her when in situations where she may need to process information, weigh the pros and cons of different options, and make decisions, in order to ensure that she fully understands and recalls all information to be considered.  [name] is encouraged to attend to lifestyle factors for brain health (e.g., regular physical exercise, good nutrition habits, regular participation in cognitively-stimulating activities, and general stress management techniques), which are likely to have benefits for both emotional adjustment and cognition. Optimal control of vascular risk factors (including safe cardiovascular exercise and adherence to dietary recommendations) is encouraged. Likewise, continued compliance with her CPAP machine will also be important. Continued participation in activities which provide mental stimulation and social interaction is also recommended.   Memory can be improved using internal strategies such as rehearsal, repetition, chunking, mnemonics, association, and imagery. External strategies such as written notes in a consistently used memory journal, visual and nonverbal auditory cues such as a calendar on the refrigerator or appointments with alarm, such as on a cell phone, can also help maximize recall.    To address problems with fluctuating attention, she may wish to consider:   -Avoiding external  distractions when needing to concentrate   -Limiting exposure to fast paced environments with multiple sensory demands   -Writing down complicated information and using checklists   -Attempting and completing one task at a time (i.e., no multi-tasking)   -Verbalizing aloud each step of a task to maintain focus   -Reducing the amount of information considered at one time  Review of Records:   Ms. Milian completed a comprehensive neuropsychological evaluation Kandis Nab, Psy.D.) on 03/05/2018 in the context of ongoing memory concerns. Results of cognitive testing were said to be abnormal, and even when interpreted in the context of low level of education, there were areas of significant impairment per Dr. Marcia Brash interpretation. [name] felt that symptoms met criteria for mild dementia at that time. Despite full functional independence, Ms. Meeker had reported that she had recent had trouble remembering that she had been summoned for jury duty, which is what reportedly led to the dementia designation. Based upon severe verbal memory impairment, concerns were expressed for early Alzheimer's disease as the primary culprit. However, repeat testing was recommended.  Ms. Hobday met with [facility] Clarise Cruz [place], [place]) on 06/13/2021 for an evaluation of memory loss. Performance on a brief cognitive screening instrument (MOCA) was 17/30. She has been on 50m of Aricept for several years without reported side effect. Ultimately, Ms. LMischkewas referred for a comprehensive neuropsychological evaluation to characterize her cognitive abilities and to assist with diagnostic clarity and treatment planning.   Brain MRI on 03/22/2018 revealed mild small vessel ischemia but was otherwise unremarkable. Brain MRI on 06/28/2021 revealed mild generalized cerebral atrophy and minimal small vessel ischemic changes.  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 04/01/2017   Chest pain at rest 01/27/2016   Chronic diastolic heart failure 029/79/8921  Decreased estrogen level    Degeneration of lumbar intervertebral disc    Essential hypertension 01/27/2016  Exertional dyspnea 01/27/2016   GERD (gastroesophageal reflux disease)    HNP (herniated nucleus pulposus) with myelopathy, cervical 04/01/2017   Hypersomnolence 01/27/2016   Hypothyroidism 01/27/2016   Idiopathic scoliosis of lumbar spine    Impaired fasting glucose    Injury of superior glenoid labrum of shoulder joint 03/12/2018   Insomnia    Lumbar stenosis with neurogenic claudication 08/23/2021   Major depressive disorder    MCI (mild cognitive impairment) 03/05/2018   Mixed hyperlipidemia 01/27/2016   Murmur, cardiac 01/27/2016   Neuropathy    Non-toxic goiter    OSA (obstructive sleep apnea) 04/22/2016   nightly CPAP use   Osteoarthritis    Pain in joint of left shoulder 03/12/2018   Pericardial effusion 01/27/2016   PONV (postoperative nausea and vomiting)    Pre-diabetes    Restless legs    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   Shortness of breath dyspnea    relating to asthma   Syncope 01/27/2016    Past Surgical History:  Procedure Laterality Date   ABDOMINAL HYSTERECTOMY     BREAST REDUCTION SURGERY  2011   CERVICAL DISC ARTHROPLASTY N/A 04/01/2017   Procedure: CERVICAL ANTERIOR [place] ARTHROPLASTY C5-C6;  Surgeon: Kristeen Miss, MD;  Location: [facility];  Service: Neurosurgery;  Laterality: N/A;   COLONOSCOPY     ESOPHAGOGASTRODUODENOSCOPY     EYE SURGERY     bilateral cataracts   JOINT REPLACEMENT     right shoulder replacement   REDUCTION MAMMAPLASTY     REVERSE SHOULDER ARTHROPLASTY Right 08/02/2016   Procedure: REVERSE SHOULDER ARTHROPLASTY;  Surgeon: Netta Cedars, MD;  Location: [facility];  Service: Orthopedics;  Laterality: Right;    Current Outpatient  Medications:    albuterol (PROVENTIL HFA;VENTOLIN HFA) 108 (90 Base) MCG/ACT inhaler, Inhale 2 puffs into the lungs every 6 (six) hours as needed for wheezing or shortness of breath. , Disp: , Rfl:    amLODipine (NORVASC) 5 MG tablet, TAKE 1 TABLET BY MOUTH  DAILY, Disp: 90 tablet, Rfl: 3   aspirin 81 MG tablet, Take 81 mg by mouth daily., Disp: , Rfl:    dexamethasone (DECADRON) 1 MG tablet, 2 tablets twice daily for 2 days, one tablet twice daily for 2 days, one tablet daily for 2 days., Disp: 15 tablet, Rfl: 0   donepezil (ARICEPT) 10 MG tablet, Take 10 mg by mouth at bedtime., Disp: , Rfl:    furosemide (LASIX) 20 MG tablet, Take 1 tablet (20 mg total) by mouth daily. NEED OV., Disp: 30 tablet, Rfl: 0   levothyroxine (SYNTHROID, LEVOTHROID) 50 MCG tablet, Take 50 mcg by mouth daily. , Disp: , Rfl:    lisinopril-hydrochlorothiazide (ZESTORETIC) 20-25 MG tablet, Take 1 tablet by mouth daily., Disp: , Rfl:    methocarbamol (ROBAXIN) 500 MG tablet, Take 1 tablet (500 mg total) by mouth every 6 (six) hours as needed for muscle spasms., Disp: 40 tablet, Rfl: 3   oxyCODONE-acetaminophen (PERCOCET/ROXICET) 5-325 MG tablet, Take 1-2 tablets by mouth every 4 (four) hours as needed for severe pain (br)., Disp: 60 tablet, Rfl: 0   rOPINIRole (REQUIP) 0.5 MG tablet, Take 0.5 mg by mouth at bedtime., Disp: , Rfl:    rosuvastatin (CRESTOR) 20 MG tablet, TAKE 1 TABLET BY MOUTH  DAILY (KEEP OFFICE VISIT), Disp: 90 tablet, Rfl: 3   sertraline (ZOLOFT) 100 MG tablet, Take 100 mg by mouth daily., Disp: , Rfl:    traMADol (ULTRAM) 50 MG tablet, Take 50 mg by mouth every 6 (six) hours as  needed for severe pain., Disp: , Rfl:    TRELEGY ELLIPTA 100-62.5-25 MCG/INH AEPB, Inhale 1 puff into the lungs every other day., Disp: , Rfl:    vitamin B-12 (CYANOCOBALAMIN) 1000 MCG tablet, Take 1,000 mcg by mouth daily., Disp: , Rfl:   Clinical Interview:   The following information was obtained during a clinical interview  with Ms. Zertuche prior to cognitive testing.  Cognitive Symptoms: Decreased short-term memory: Endorsed. Back in 2019, Ms. Charline Bills noted that memory dysfunction had been present for the past 1-1.5 years. Examples included forgetting the details of past conversations, forgetting recent events, repeating questions and statements, and misplacing things around her residence. Currently, she noted that these symptoms have persisted. She added that she may enter a room and forget her original intention. She was unclear if they had progressively worsened since 2019. Recent notes from her PCP do suggest she informed this physician that memory had been gradually worsening.  Decreased long-term memory: Denied. Decreased attention/concentration: Endorsed. Specifically, she reported feeling as though she may be more easily distracted.  Reduced processing speed: Endorsed. Difficulties with executive functions: Denied. She also denied trouble with impulsivity or any significant personality changes.  Difficulties with emotion regulation: Denied. Difficulties with receptive language: Denied. Difficulties with word finding: Endorsed. Decreased visuoperceptual ability: Denied.  Difficulties completing ADLs: Denied. In 2019, Ms. Stoffel lived alone and was fully functional outside of an instance where she forget about jury duty despite being reminded that same day. Currently, Ms. Burkle reported that she has continued to live alone and has remained independent with medication management, financial management, and bill paying. She also continues to drive without reported difficulty. Continued functional independence does seem to be corroborated in other recent medical records.   Additional Medical History: History of traumatic brain injury/concussion: Denied. History of stroke: Denied. History of seizure activity: Denied. History of known exposure to toxins: Denied. Symptoms of chronic pain: Denied. Experience of  frequent headaches/migraines: Denied. Frequent instances of dizziness/vertigo: Denied. She did report occasional instances where she will stand up quickly, her eyes will blur, and she will feel dizzy. Symptoms were said to subside quite quickly.   Sensory changes: Denied.  Balance/coordination difficulties: Denied. She also denied any recent falls.  Other motor difficulties: Endorsed. She reported mild, sporadic tremors which are not present all the time. She was unable to identify any triggers or known symptoms or situations which bring about tremors. Per notes from her PCP, mild tremors have been present for many years and do not appear to have progressively worsened.   Sleep History: Estimated hours obtained each night: Unclear. Ms. Dier described her sleep as sporadic. She noted that some nights she will be "up all night, I don't sleep a bit." These nights are generally followed by decent sleep due to fatigue levels. This pattern was said to be ongoing and unchanged for quite some time.  Difficulties falling asleep: Endorsed. Difficulties staying asleep: Endorsed. Feels rested and refreshed upon awakening: Variably so depending on the quantity and quality of sleep she is able to obtain the night before.   History of snoring: Endorsed. History of waking up gasping for air: Endorsed. Witnessed breath cessation while asleep: Endorsed. She acknowledged a history of obstructive sleep apnea and reported using her CPAP machine nightly.   History of vivid dreaming: Denied. However, per notes from her PCP on 06/04/2021, she reported to Dr. Brigitte Pulse that she will experience vivid "crazy dreams." Excessive movement while asleep: Endorsed. She reported mild jerking behaviors, stating  that during her prior sleep study which revealed obstructive sleep apnea, there was also mention of jerking behaviors and some tossing and turning behaviors. Medical records do suggest a history of restless leg symptoms.   Instances of acting out her dreams: Denied. However, per notes from her PCP on 06/04/2021, Dr. Brigitte Pulse noted that she will occasionally act out vivid dreams described above. Dr. Brigitte Pulse further noted Ms. Milanese stating that her now deceased husband used to tell her that she would occasionally sleepwalk.   Psychiatric/Behavioral Health History: Depression: Endorsed. She reported a longstanding history of generally mild depressive symptoms. She has taken sertraline for several years at this point and reported continued effectiveness at managing symptoms. She denied previous involvement with a psychotherapist. Currently, she described her mood as "good." Current or remote suicidal ideation, intent, or plan was denied.  Anxiety: Denied. Symptoms were said to occur occasionally and she denied, to her knowledge, any prior official anxiety-based mental health diagnosis.  Mania: Denied. Trauma History: Denied. Visual/auditory hallucinations: Denied. Delusional thoughts: Denied.  Tobacco: Denied. Alcohol: She denied current alcohol consumption as well as a history of problematic alcohol abuse or dependence.  Recreational drugs: Denied.  Family History: Problem Relation Age of Onset   Heart failure Mother    Diabetes Mother    Hypertension Mother    Hyperlipidemia Mother    Lung cancer Father    Diabetes Father    Hypertension Father    Hypertension Sister    Hyperlipidemia Sister    Hyperthyroidism Sister    Lung cancer Brother    Rheum arthritis Child    Lung cancer Child    Dementia Maternal Grandmother    Heart disease Maternal Grandfather    Lung cancer Paternal Grandfather    Diabetes Brother    Hyperlipidemia Brother    Hyperlipidemia Brother    Dementia Maternal Aunt    This information was confirmed by Ms. Hollice Espy.  Academic/Vocational History: Highest level of educational attainment: 9 years. Ms. Duda reported completing the 9th grade and then pursuing no further academic  achievement. When asked why she left school settings, she did not provide any specific rationale. She noted that she always had longstanding difficulties with comprehension and was enrolled in special education courses throughout academic settings. She was likely a poor student due to academic difficulties and may have an undiagnosed, unspecified learning disability.  History of developmental delay: Denied. History of grade repetition: Denied. Enrollment in special education courses: Endorsed. History of ADHD: Denied.  Employment: Retired. She previously worked as an Merchandiser, retail.   Evaluation Results:   Behavioral Observations: Ms. Suder was unaccompanied, arrived to her appointment on time, and was appropriately dressed and groomed. She appeared alert and oriented. Observed gait and station were within normal limits. Gross motor functioning appeared intact upon informal observation and no abnormal movements (e.g., tremors) were noted. Her affect was generally relaxed and positive. Spontaneous speech was fluent and word finding difficulties were not observed during the clinical interview. Thought processes were coherent, organized, and normal in content. Insight into her cognitive difficulties appeared adequate. During testing, sustained attention was appropriate. Task engagement was adequate and she persisted when challenged. She did have some trouble comprehending task instructions across latter conditions of the D-KEFS Color Word task. Overall, Ms. Dave was cooperative with the clinical interview and subsequent testing procedures.   Adequacy of Effort: The validity of neuropsychological testing is limited by the extent to which the individual being tested may be assumed to have exerted  adequate effort during testing. Ms. Gaertner expressed her intention to perform to the best of her abilities and exhibited adequate task engagement and persistence. Scores across stand-alone and embedded  performance validity measures were variable. Overall, while I believe the results of the current evaluation to be a valid representation of Ms. Kiker current cognitive functioning, a mild degree of caution when interpreting lower scores is prudent.  Test Results: Ms. Constable was largely oriented at the time of the current evaluation. One point was lost for her re-arranging the last four digits of her phone number.  Intellectual abilities based upon educational and vocational attainment were estimated to be in the well below average to below average range. Premorbid abilities were estimated to be within the well below average range based upon a single-word reading test. Intellectual abilities were also estimated to be in the well below average range on a grouping of subtests used to calculate a proposed short form IQ.    Processing speed was average. Basic attention was below average. More complex attention (e.g., working memory) was below average to average. Executive functioning was variable but largely below expectation, with scores ranging from the exceptionally low to average normative ranges.  Assessed receptive language abilities were average. Likewise, Ms. Kreischer generally did not exhibit any difficulties comprehending task instructions and answered all questions asked of her appropriately. Assessed expressive language was exceptionally low to well below average across verbal fluency tasks and below average across a confrontation naming task.     Assessed visuospatial/visuoconstructional abilities were below average to above average.    Learning (i.e., encoding) of novel verbal information was exceptionally low to well below average. Spontaneous delayed recall (i.e., retrieval) of previously learned information was exceptionally low across verbal tasks but above average across a visual task. Retention rates were 27% (raw score of 3) across a story learning task, 33% (spontaneous) to 50%  (cued) across a list learning task, and 79% across a figure drawing task. Performance across recognition tasks was variable, ranging from the well below average to average normative ranges, suggesting some evidence for information consolidation.   Results of emotional screening instruments suggested that recent symptoms of generalized anxiety were in the minimal range, while symptoms of depression were within normal limits. A screening instrument assessing recent sleep quality suggested the presence of mild sleep dysfunction.  Tables of Scores:   Note: This summary of test scores accompanies the interpretive report and should not be considered in isolation without reference to the appropriate sections in the text. Descriptors are based on appropriate normative data and may be adjusted based on clinical judgment. Terms such as "Within Normal Limits" and "Outside Normal Limits" are used when a more specific description of the test score cannot be determined. Descriptors refer to the current evaluation only.         Percentile - Normative Descriptor > 98 - Exceptionally High 91-97 - Well Above Average 75-90 - Above Average 25-74 - Average 9-24 - Below Average 2-8 - Well Below Average < 2 - Exceptionally Low        Validity:    DESCRIPTOR   April 2019 Current    Dot Counting Test: --- --- --- Outside Normal Limits  WAIS-IV Reliable Digit Span: --- --- --- Outside Normal Limits  CVLT-III Forced Choice Recognition: --- --- --- Within Normal Limits  D-KEFS Color Word Effort Index: --- --- --- Within Normal Limits        Orientation:       Raw Score  Raw Score Percentile   NAB Orientation, Form 1 --- 28/29 --- ---        Cognitive Screening:       Raw Score Raw Score Percentile   SLUMS: --- 17/30 --- ---        Intellectual Functioning:       Standard Score Standard Score Percentile   Test of Premorbid Functioning: 74 74 4 Well Below Average        Wechsler Adult Intelligence Scale  (WAIS-IV) Short Form*: Standard Score/ Scaled Score Standard Score/ Scaled Score Percentile   Full Scale IQ  --- 75 5 Well Below Average    Information  --- 4 2 Well Below Average    Visual Puzzles --- 7 16 Below Average    Digit Span --- 6 9 Below Average    Coding _0 Average  *From [employer] (2009)            Memory:      Wechsler Memory Scale (WMS-IV):                       Raw Score (Scaled Score) Raw Score (Scaled Score) Percentile     Logical Memory I 11/53 (3) 15/53 (4) 2 Well Below Average    Logical Memory II 0/39 (1) 3/39 (3) 1 Exceptionally Low    Logical Memory Recognition 8/23 13/23 3-9 Well Below Average        [place] Verbal Learning Test (CVLT-III) Brief Form: Raw Score (Z-Score) Raw Score (Scaled/Standard Score) Percentile     Total Trials 1-4 19/36 (32T) 16/36 (63) 1 Exceptionally Low    Short-Delay Free Recall 5/9 (-1.5) 2/9 (1) <1 Exceptionally Low    Long-Delay Free Recall 4/9 (-1.5) 2/9 (3) 1 Exceptionally Low    Long-Delay Cued Recall 5/9 (-1.5) 3/9 (3) 1 Exceptionally Low      Recognition Hits 8/9 8/9 (10) 50 Average      False Positive Errors 3 2 (7) 16 Below Average         Raw Score Raw Score (Scaled Score) Percentile   RBANS Figure Copy: 19/20 19/20 (12) 75 Above Average  RBANS Figure Recall: 11/20 15/20 (12) 75 Above Average    Figure Recognition: --- 5/8 21-29 Average        Attention/Executive Function:      Trail Making Test (TMT): Raw Score Raw Score (T Score) Percentile     Part A 54 secs.,  0 errors 49 secs.,  0 errors (44) 27 Average    Part B 95 secs.,  0 errors 98 secs.,  0 errors (53) 62 Average          Scaled Score Scaled Score Percentile   WAIS-IV Digit Span: --- 6 9 Below Average    Forward _1 Below Average    Backward _2 Average    Sequencing --- 6 9 Below Average        D-KEFS Color-Word Interference Test: Raw Score (Scaled Score) Raw Score (Scaled Score) Percentile     Color Naming --- 34 secs.  (10) 50 Average    Word Reading --- 28 secs. (9) 37 Average    Inhibition --- 79 secs. (9) 37 Average      Total Errors --- 14 errors (1) <1 Exceptionally Low    Inhibition/Switching --- 86 secs. (9) 37 Average      Total Errors --- 10 errors (3) 1 Exceptionally Low  D-KEFS Verbal Fluency Test: Raw Score (Scaled Score) Raw Score (Scaled Score) Percentile     Letter Total Correct --- 9 (2) <1 Exceptionally Low    Category Total Correct --- 19 (4) 2 Well Below Average    Category Switching Total Correct --- 8 (5) 5 Well Below Average    Category Switching Accuracy --- 6 (5) 5 Well Below Average      Total Set Loss Errors --- 3 (9) 37 Average      Total Repetition Errors --- 1 (12) 75 Above Average        Language:      Verbal Fluency Test: Raw Score Raw Score (T Score) Percentile     Phonemic Fluency (FAS) 14 9 (20) <1 Exceptionally Low    Animal Fluency 13 8 (26) 1 Exceptionally Low         NAB Language Module, Form 1: T Score T Score Percentile     Auditory Comprehension --- 49 46 Average    Naming --- 27/31 (40) 16 Below Average         Raw Score (T Score) T Score Percentile   [place] Naming Test (BNT): 48/60 (44T) --- --- ---        Visuospatial/Visuoconstruction:       Raw Score Raw Score Percentile   Clock Drawing: WNL 10/10 --- Within Normal Limits        NAB Spatial Module, Form 1: T Score T Score Percentile     Visual Discrimination --- 48 42 Average         Scaled Score Scaled Score Percentile   WAIS-IV Block Design: 8 9 37 Average        Mood and Personality:       Raw Score Raw Score Percentile   Geriatric Depression Scale: --- 6 --- Within Normal Limits  Geriatric Anxiety Scale: --- 3 --- Minimal    Somatic --- 2 --- Minimal    Cognitive --- 0 --- Minimal    Affective --- 1 --- Minimal        Additional Questionnaires:       Raw Score Raw Score Percentile   PROMIS Sleep Disturbance Questionnaire: --- 25 --- Mild   Informed Consent and  Coding/Compliance:   The current evaluation represents a clinical evaluation for the purposes previously outlined by the referral source and is in no way reflective of a forensic evaluation.   Ms. Deupree was provided with a verbal description of the nature and purpose of the present neuropsychological evaluation. Also reviewed were the foreseeable risks and/or discomforts and benefits of the procedure, limits of confidentiality, and mandatory reporting requirements of this provider. The patient was given the opportunity to ask questions and receive answers about the evaluation. Oral consent to participate was provided by the patient.   This evaluation was conducted by Christia Reading, Ph.D., ABPP-CN, board certified clinical neuropsychologist. Ms. Dorning completed a clinical interview with Dr. Melvyn Novas, billed as one unit [phone], and 125 minutes of cognitive testing and scoring, billed as one unit [phone] and three additional units [id number]. Psychometrist Milana Kidney, B.S., assisted Dr. Melvyn Novas with test administration and scoring procedures. As a separate and discrete service, Dr. Melvyn Novas spent a total of 160 minutes in interpretation and report writing billed as one unit [phone] and two units 96133.

## 2021-10-26 ENCOUNTER — Other Ambulatory Visit: Payer: Self-pay

## 2021-10-26 ENCOUNTER — Ambulatory Visit (INDEPENDENT_AMBULATORY_CARE_PROVIDER_SITE_OTHER): Payer: Medicare Other | Admitting: Psychology

## 2021-10-26 DIAGNOSIS — G3184 Mild cognitive impairment, so stated: Secondary | ICD-10-CM | POA: Diagnosis not present

## 2021-10-26 NOTE — Progress Notes (Signed)
   Neuropsychology Feedback Session Charlotte [name]. [facility]  Reason for Referral:   Charlotte [name] is a 70 y.o. right-handed Caucasian female referred by  Charlotte Butters, PA-C , to characterize her current cognitive functioning and assist with diagnostic clarity and treatment planning in the context of subjective cognitive decline.   Feedback:   Charlotte [name] completed a comprehensive neuropsychological evaluation on 10/19/2021. Please refer to that encounter for the full report and recommendations. Briefly, results suggested severe impairment surrounding all aspects of verbal memory, as well as verbal fluency (phonemic somewhat worse than semantic). Performance variability but generally below expectation performances were also exhibited across the domain of executive functioning. Relative to her previous evaluation in 2019, nearly all cognitive performances were stable. Subtle decline was seen across one memory task (list learning), as well as aspects of both phonemic and semantic fluency. However, outside of this, no clinically significant decline was captured between her previous evaluation and current test performances. The etiology for ongoing impairment is unclear. Recent neuroimaging did not reveal any significant vascular risk factors and Charlotte [name] does not demonstrate strong behavioral features of Lewy body disease or frontotemporal lobar degeneration, making vascular dementia, Lewy body dementia, and frontotemporal dementia unlikely. Given significant impairment across verbal memory, continued considerations surrounding Alzheimer's disease as the underlying cause are certainly warranted, especially in the absence of a viable alternative. With that being said, it is also worth highlighting that Charlotte [name]'s profile is not consistent with typically presenting Alzheimer's disease.   Charlotte [name] was accompanied by her step-daughter during the  current feedback session. Content of the current session focused on the results of her neuropsychological evaluation. Charlotte [name] was given the opportunity to ask questions and her questions were answered. She was encouraged to reach out should additional questions arise. A copy of her report was provided at the conclusion of the visit.      20 minutes were spent conducting the current feedback session with Charlotte [name], billed as one unit [phone].

## 2021-10-31 ENCOUNTER — Encounter: Payer: Self-pay | Admitting: Physician Assistant

## 2021-10-31 ENCOUNTER — Ambulatory Visit: Payer: Medicare Other | Admitting: Physician Assistant

## 2021-10-31 ENCOUNTER — Other Ambulatory Visit: Payer: Self-pay

## 2021-10-31 VITALS — BP 132/71 | HR 66 | Ht 66.0 in | Wt 197.2 lb

## 2021-10-31 DIAGNOSIS — G3184 Mild cognitive impairment, so stated: Secondary | ICD-10-CM

## 2021-10-31 NOTE — Progress Notes (Signed)
Assessment/Plan:    Mild Neurocognitive Disorder  (MCI)   Recommendations:  Discussed safety both in and out of the home.  Discussed the importance of regular daily schedule with inclusion of crossword puzzles to maintain brain function.  Continue to monitor mood by PCP Stay active at least 30 minutes at least 3 times a week.  Naps should be scheduled and should be no longer than 60 minutes and should not occur after 2 PM.  Mediterranean diet is recommended  Continue donepezil 10 mg daily Side effects were discussed Follow up in  6 months. Repeat neuropsychological evaluation in 24 months (or sooner if functional decline is noted) is recommended to assess the trajectory of future cognitive decline should it occur. This will also aid in future efforts towards improved diagnostic clarity.  Case discussed with Charlotte [name] who agrees with the plan     Subjective:    Charlotte [name] is a very pleasant 70 y.o. RH female  hypertension, hyperlipidemia,  hypothyroidism, OSA on CPAP, insomnia, dCHF, anxiety, depression and  seen today for evaluation of memory loss. This patient is accompanied in the office by her who supplements the history.  Previous records as well as any outside records available were reviewed prior to todays visit.  Patient was last seen at our office on 06/13/21  at which time MoCA was 17/30. Neuropsych evaluation suspects that Alzheimer's continues to be possible although there is not  a better explanation presently. She was stable during the past 3.5 years and her overall patterns are not fully consistent with AD (e.g., strong visual memory in 2019 and now). Patient is currently on donepezil 10 mg daily tolerating well. In today's visit she reports that her memory "OK, about the same, mostly, the difficulties are centered around her short term memory. LTM is normal. She denies repeating the same stories and asks same questions. She continues to have issues with  remembering what she was searching for when entering a room. She denies leaving objects in unusual places. Her mood is "very good , look on the positive side of things". She likes to work in the yard, play candy crush,  and attend her Bible study every week. She does not do crossword puzzles or word finding.  The patient lives alone, but her sister lives across from her, monitoring any changes.  She has been dealing with some depression, but this is not worse than prior.  She denies irritability.  Her sleep is poor because she has to "get up a lot at night to go to the bathroom and wear a CPAP so uncomfortable". Denies vivid dreams as she had before. She denies sleepwalking, although her deceased husband used to tell her that she did.  Denies hallucinations or paranoia.  She denies any issues with bathing and dressing and is compliant w meds.  She takes care of her own finances, without forgetting to pay any bills.  Her appetite is good but watches her diet, having lost 20 lbs since her back surgery. She cooks, and denies leaving the stove or the faucet on, or forgetting common recipes.She underwent a successful L5-S1 laminectomy for spondylosis, stenosis and radiculopathy in Oct 2022. She is ambulating better without pain, denies any recent falls.  Back pain improved without  focal numbness or tingling, unilateral weakness, but she does have mild tremors, which she reports having them for many years when anxious.  She does not drop any objects or have any trouble writing or utilizing utensils.  She continues to drive without getting lost, she uses frequently the GPS.  She denies any headaches.   She denies any injuries to the head, double vision or dizziness.  Denies urine incontinence during the day.  She denies constipation or diarrhea.  Denies anosmia.    Initial Visit 06/13/21  The patient is seen in neurologic consultation at the request of Charlotte Neer, MD for the evaluation of memory.  The patient is  here alone.  She is a 70 year old woman who had memory issues for about 3 years.  In 2/ 2019, the patient was seen by neuropsychology, Dr. Si [name] and after extensive work-up, she was diagnosed with mild dementia, rule out Alzheimer's disease.  She was placed on Aricept 10 mg daily, and she is tolerating it well.  In addition, other issues contributed to her memory loss were mild major depressive disorder, and moderate generalized anxiety disorder. At the time, she was forgetting appointments, including jury duty twice in the Fall of 2018 even after being reminded on the same day.  She also was forgetting details of recent conversations, forgetting recent events, repeating questions and statements, misplacing and losing things, word finding difficulty, and occasional difficulty following a conversation or comprehending what is being told to her.  She was not appearing to have any significant difficulty with attention and concentration, or visual spatial navigation. Psychiatry was recommended but patient declined follow up.  During a visit to her PCP on 06/04/2021, she reported being "a little more forgetful ".  She was referred for further evaluation.  Although "Aricept helps, it feels that it is taking me a little longer to get the words out ".  She gets to her room, and does not remember what she was coming there for.  The patient lives alone, but her sister lives next door, and monitors any changes.  She has been dealing with some depression, but this is not worse than prior.  She denies irritability.  Her sleep is poor because she has to "get up a lot at night to go to the bathroom and wear a CPAP so uncomfortable". She does report vivid dreams, she calls them "crazy dreams ", and at times she acts them out.  She denies sleepwalking, although her dead husband used to tell her that she used to sleepwalk at times.  Denies hallucinations or paranoia.  She denies any issues with bathing and dressing.  Occasionally  she forgets to take her medication.  She takes care of her own finances, without forgetting to pay any bills.  She denies leaving objects in unusual places.  Her appetite is good, denies trouble swallowing, or sialorrhea.  She cooks, and denies leaving the stove or the faucet on, or forgetting common recipes.  She ambulates without difficulty without a walker or a cane.  She denies any falls.  She has a history of right leg neuropathy, followed by Dr. Hardin Negus, otherwise no focal numbness or tingling, unilateral weakness, but she does have mild tremors, which she reports having them for many years, and they have not gotten worse.  She does not yet drop any objects or have any trouble writing or utilizing utensils.  She continues to drive without getting lost, she uses frequently the GPS.  She denies any headaches, or falls.  She denies any injuries to the head, double vision or dizziness.  Denies urine incontinence during the day.  She denies constipation or diarrhea.  Denies anosmia.  Denies any history of alcohol or tobacco.  Family  history significant for dementia in maternal grandmother and 1 maternal aunt.  She is retired from the [employer] long since 2013.  She is widowed since 2010.  She has 3 daughters and 5 grandchildren.  Neuropsych evaluation Dr. Melvyn Novas 10/19/21  "Results suggested severe impairment surrounding all aspects of verbal memory, as well as verbal fluency (phonemic somewhat worse than semantic). Performance variability but generally below expectation performances were also exhibited across the domain of executive functioning. Relative to her previous evaluation in 2019, nearly all cognitive performances were stable. Subtle decline was seen across one memory task (list learning), as well as aspects of both phonemic and semantic fluency. However, outside of this, no clinically significant decline was captured between her previous evaluation and current test performances. The etiology for  ongoing impairment is unclear. Recent neuroimaging did not reveal any significant vascular risk factors and Ms. Orrison does not demonstrate strong behavioral features of Lewy body disease or frontotemporal lobar degeneration, making vascular dementia, Lewy body dementia, and frontotemporal dementia unlikely. Given significant impairment across verbal memory, continued considerations surrounding Alzheimer's disease as the underlying cause are certainly warranted, especially in the absence of a viable alternative. With that being said, it is also worth highlighting that Ms. Hankinson's profile is not consistent with typically presenting Alzheimer's disease.     Labs 06/04/21:  A1C 6.5,  Lipid panel normal, TSH 2.29 nl,  CMP normal    MRI brain 03/21/18 w and wo contrast  1. Normal MRI appearance of the brain for age. 2. 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  Brain MRI on 06/28/2021 revealed mild generalized cerebral atrophy and minimal small vessel ischemic changes.  PREVIOUS MEDICATIONS:   CURRENT MEDICATIONS:  Outpatient Encounter Medications as of 10/31/2021  Medication Sig   albuterol (PROVENTIL HFA;VENTOLIN HFA) 108 (90 Base) MCG/ACT inhaler Inhale 2 puffs into the lungs every 6 (six) hours as needed for wheezing or shortness of breath.    aspirin 81 MG tablet Take 81 mg by mouth daily.   donepezil (ARICEPT) 10 MG tablet Take 10 mg by mouth at bedtime.   furosemide (LASIX) 20 MG tablet Take 1 tablet (20 mg total) by mouth daily. NEED OV.   levothyroxine (SYNTHROID, LEVOTHROID) 50 MCG tablet Take 50 mcg by mouth daily.    lisinopril-hydrochlorothiazide (ZESTORETIC) 20-25 MG tablet Take 1 tablet by mouth daily.   methocarbamol (ROBAXIN) 500 MG tablet Take 1 tablet (500 mg total) by mouth every 6 (six) hours as needed for muscle spasms.   rOPINIRole (REQUIP) 0.5 MG tablet Take 0.5 mg by mouth at bedtime.   rosuvastatin (CRESTOR) 20 MG tablet TAKE 1 TABLET BY MOUTH   DAILY (KEEP OFFICE VISIT)   sertraline (ZOLOFT) 100 MG tablet Take 100 mg by mouth daily.   TRELEGY ELLIPTA 100-62.5-25 MCG/INH AEPB Inhale 1 puff into the lungs every other day.   vitamin B-12 (CYANOCOBALAMIN) 1000 MCG tablet Take 1,000 mcg by mouth daily.   [DISCONTINUED] amLODipine (NORVASC) 5 MG tablet TAKE 1 TABLET BY MOUTH  DAILY   [DISCONTINUED] dexamethasone (DECADRON) 1 MG tablet 2 tablets twice daily for 2 days, one tablet twice daily for 2 days, one tablet daily for 2 days.   [DISCONTINUED] oxyCODONE-acetaminophen (PERCOCET/ROXICET) 5-325 MG tablet Take 1-2 tablets by mouth every 4 (four) hours as needed for severe pain (br).   [DISCONTINUED] traMADol (ULTRAM) 50 MG tablet Take 50 mg by mouth every 6 (six) hours as needed for severe pain.   No facility-administered encounter  medications on file as of 10/31/2021.     Objective:     PHYSICAL EXAMINATION:    VITALS:   Vitals:   10/31/21 0734  BP: 132/71  Pulse: 66  SpO2: 97%  Weight: 197 lb 3.2 oz (89.4 kg)  Height: 5\' 6"  (1.676 m)    GEN:  The patient appears stated age and is in NAD. HEENT:  Normocephalic, atraumatic.   Neurological examination:  General: NAD, well-groomed, appears stated age. Orientation: The patient is alert. Oriented to person, place and date Cranial nerves: There is good facial symmetry.The speech is fluent and clear. No aphasia or dysarthria. Fund of knowledge is appropriate. Recent memory impaired and remote memory intact. Attention and concentration are reduced.  Able to name objects and repeat phrases.  Hearing is intact to conversational tone.    Sensation: Sensation is intact to light touch throughout Motor: Strength is at least antigravity x4. DTR's 2/4 in [facility] Cognitive Assessment  06/13/2021  Visuospatial/ Executive (0/5) 3  Naming (0/3) 3  Attention: Read list of digits (0/2) 2  Attention: Read list of letters (0/1) 1  Attention: Serial 7 subtraction starting at 100  (0/3) 0  Language: Repeat phrase (0/2) 1  Language : Fluency (0/1) 0  Abstraction (0/2) 1  Delayed Recall (0/5) 0  Orientation (0/6) 6  Total 17  Adjusted Score (based on education) 17   No flowsheet data found.  No flowsheet data found.     Movement examination: Tone: There is normal tone in the UE/LE Abnormal movements:  very mild L>R resting tremors. No head tremors. No glabellar reflex. No tongue  No myoclonus.  No asterixis.   Coordination:  There is no decremation with RAM's. Normal finger to nose  Gait and Station: The patient has no difficulty arising out of a deep-seated chair without the use of the hands. The patient's stride length is good.  Gait is cautious and narrow.        Total time spent on today's visit was 30  minutes, including both face-to-face time and nonface-to-face time. Time included that spent on review of records (prior notes available to me/labs/imaging if pertinent), discussing treatment and goals, answering patient's questions and coordinating care.  Cc:  Charlotte Neer, MD Sharene Butters, PA-C

## 2021-10-31 NOTE — Patient Instructions (Signed)
It was a pleasure to see you today at our office.   Recommendations:  Follow up in 6  months Continue donepezil 10 mg daily.   RECOMMENDATIONS FOR ALL PATIENTS WITH MEMORY PROBLEMS: 1. Continue to exercise (Recommend 30 minutes of walking everyday, or 3 hours every week) 2. Increase social interactions - continue going to [place] and enjoy social gatherings with friends and family 3. Eat healthy, avoid fried foods and eat more fruits and vegetables 4. Maintain adequate blood pressure, blood sugar, and blood cholesterol level. Reducing the risk of stroke and cardiovascular disease also helps promoting better memory. 5. Avoid stressful situations. Live a simple life and avoid aggravations. Organize your time and prepare for the next day in anticipation. 6. Sleep well, avoid any interruptions of sleep and avoid any distractions in the bedroom that may interfere with adequate sleep quality 7. Avoid sugar, avoid sweets as there is a strong link between excessive sugar intake, diabetes, and cognitive impairment We discussed the Mediterranean diet, which has been shown to help patients reduce the risk of progressive memory disorders and reduces cardiovascular risk. This includes eating fish, eat fruits and green leafy vegetables, nuts like almonds and hazelnuts, walnuts, and also use olive oil. Avoid fast foods and fried foods as much as possible. Avoid sweets and sugar as sugar use has been linked to worsening of memory function.  There is always a concern of gradual progression of memory problems. If this is the case, then we may need to adjust level of care according to patient needs. Support, both to the patient and caregiver, should then be put into place.    FALL PRECAUTIONS: Be cautious when walking. Scan the area for obstacles that may increase the risk of trips and falls. When getting up in the mornings, sit up at the edge of the bed for a few minutes before getting out of bed. Consider  elevating the bed at the head end to avoid drop of blood pressure when getting up. Walk always in a well-lit room (use night lights in the walls). Avoid area rugs or power cords from appliances in the middle of the walkways. Use a walker or a cane if necessary and consider physical therapy for balance exercise. Get your eyesight checked regularly.  FINANCIAL OVERSIGHT: Supervision, especially oversight when making financial decisions or transactions is also recommended.  HOME SAFETY: Consider the safety of the kitchen when operating appliances like stoves, microwave oven, and blender. Consider having supervision and share cooking responsibilities until no longer able to participate in those. Accidents with firearms and other hazards in the house should be identified and addressed as well.   ABILITY TO BE LEFT ALONE: If patient is unable to contact 911 operator, consider using [employer], or when the need is there, arrange for someone to stay with patients. Smoking is a fire hazard, consider supervision or cessation. Risk of wandering should be assessed by caregiver and if detected at any point, supervision and safe proof recommendations should be instituted.  MEDICATION SUPERVISION: Inability to self-administer medication needs to be constantly addressed. Implement a mechanism to ensure safe administration of the medications.   DRIVING: Regarding driving, in patients with progressive memory problems, driving will be impaired. We advise to have someone else do the driving if trouble finding directions or if minor accidents are reported. Independent driving assessment is available to determine safety of driving.   If you are interested in the driving assessment, you can contact the following:  [employer]  in [place]  [place]  [place] [phone] or [phone]

## 2021-11-07 DIAGNOSIS — M5416 Radiculopathy, lumbar region: Secondary | ICD-10-CM | POA: Diagnosis not present

## 2021-11-23 DIAGNOSIS — M5136 Other intervertebral disc degeneration, lumbar region: Secondary | ICD-10-CM | POA: Diagnosis not present

## 2021-11-23 DIAGNOSIS — S86892A Other injury of other muscle(s) and tendon(s) at lower leg level, left leg, initial encounter: Secondary | ICD-10-CM | POA: Diagnosis not present

## 2021-11-23 DIAGNOSIS — M48061 Spinal stenosis, lumbar region without neurogenic claudication: Secondary | ICD-10-CM | POA: Diagnosis not present

## 2021-12-06 ENCOUNTER — Ambulatory Visit
Admission: RE | Admit: 2021-12-06 | Discharge: 2021-12-06 | Disposition: A | Discharge status: Home / Self Care | Payer: Medicare Other | Source: Ambulatory Visit | Attending: Family Medicine | Admitting: Family Medicine

## 2021-12-06 DIAGNOSIS — Z780 Asymptomatic menopausal state: Secondary | ICD-10-CM | POA: Diagnosis not present

## 2021-12-06 DIAGNOSIS — Z13820 Encounter for screening for osteoporosis: Secondary | ICD-10-CM

## 2021-12-19 IMAGING — MG MM DIGITAL DIAGNOSTIC UNILAT*R* W/ TOMO W/ CAD
6 series · 6 of 18 positions shown · non-contrast
Comparison: Previous exam(s).

CLINICAL DATA: Patient recalled from screening right breast mass.

EXAM:
DIGITAL DIAGNOSTIC UNILATERAL RIGHT MAMMOGRAM WITH TOMOSYNTHESIS AND
CAD; ULTRASOUND RIGHT BREAST LIMITED
TECHNIQUE: Right digital diagnostic mammography and breast tomosynthesis was
performed. The images were evaluated with computer-aided detection.;
Targeted ultrasound examination of the right breast was performed

[R CC synth-2D (1 of 2)]
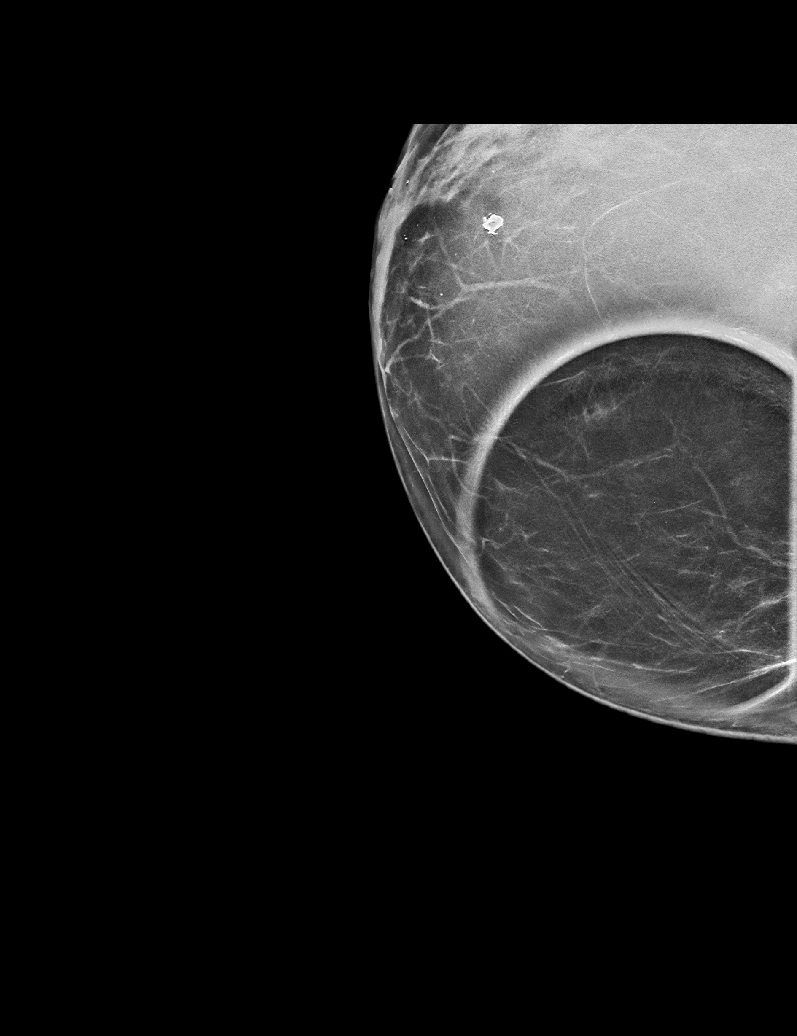

[R MLO synth-2D]
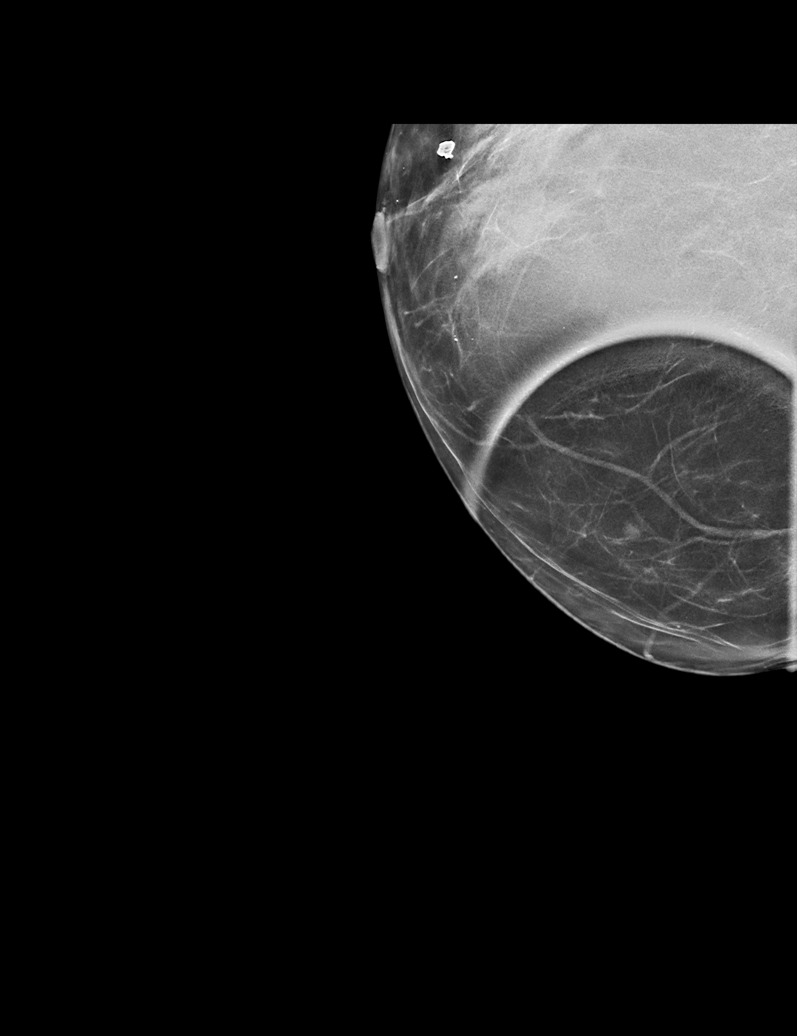

[R CC synth-2D (2 of 2)]
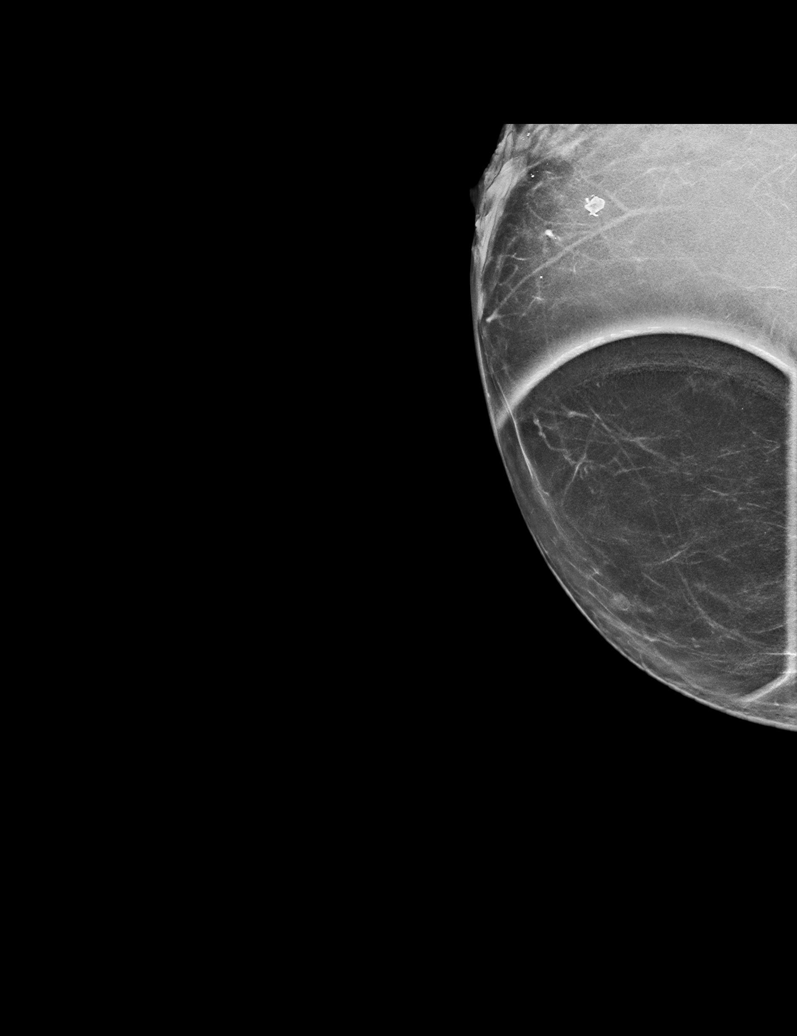

[R MLO tomo · tomo slice 29/58.0]
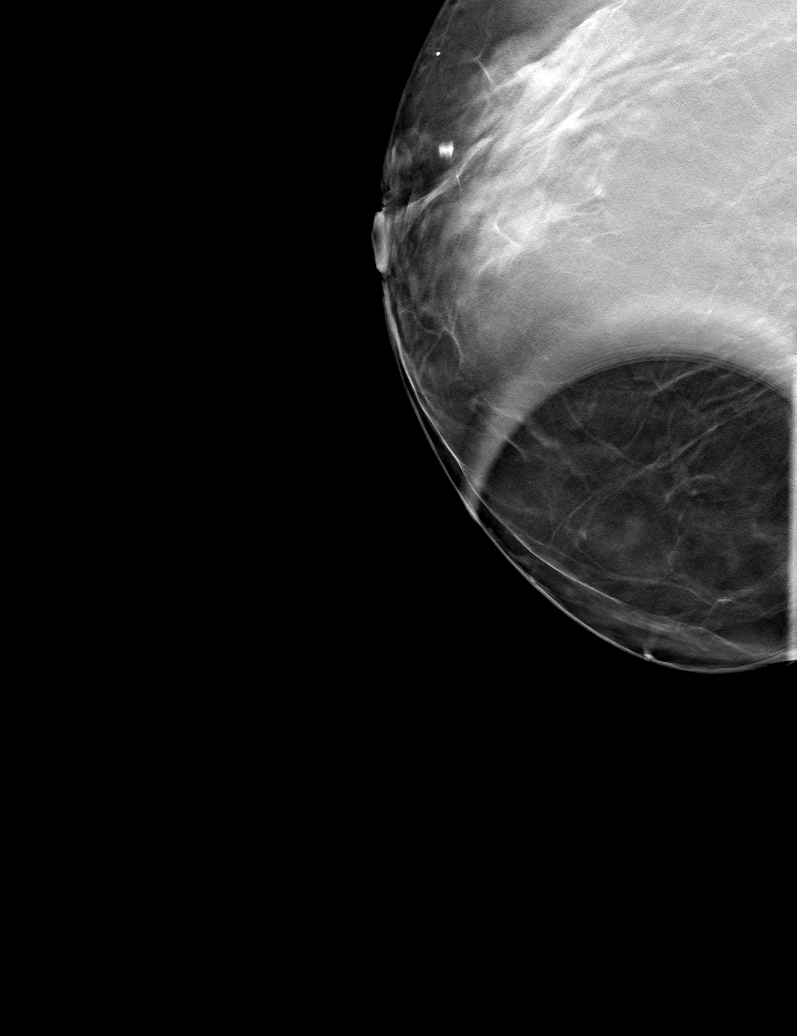

[R CC tomo (1 of 2) · tomo slice 32/63.0]
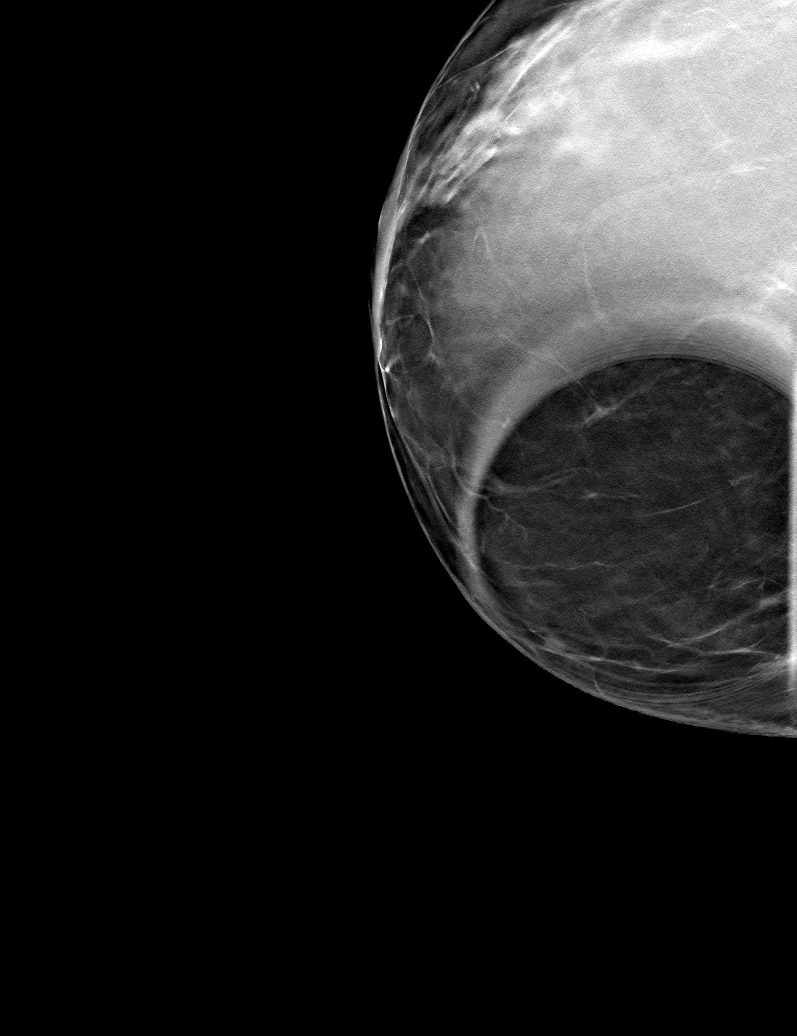

[R CC tomo (2 of 2) · tomo slice 28/55.0]
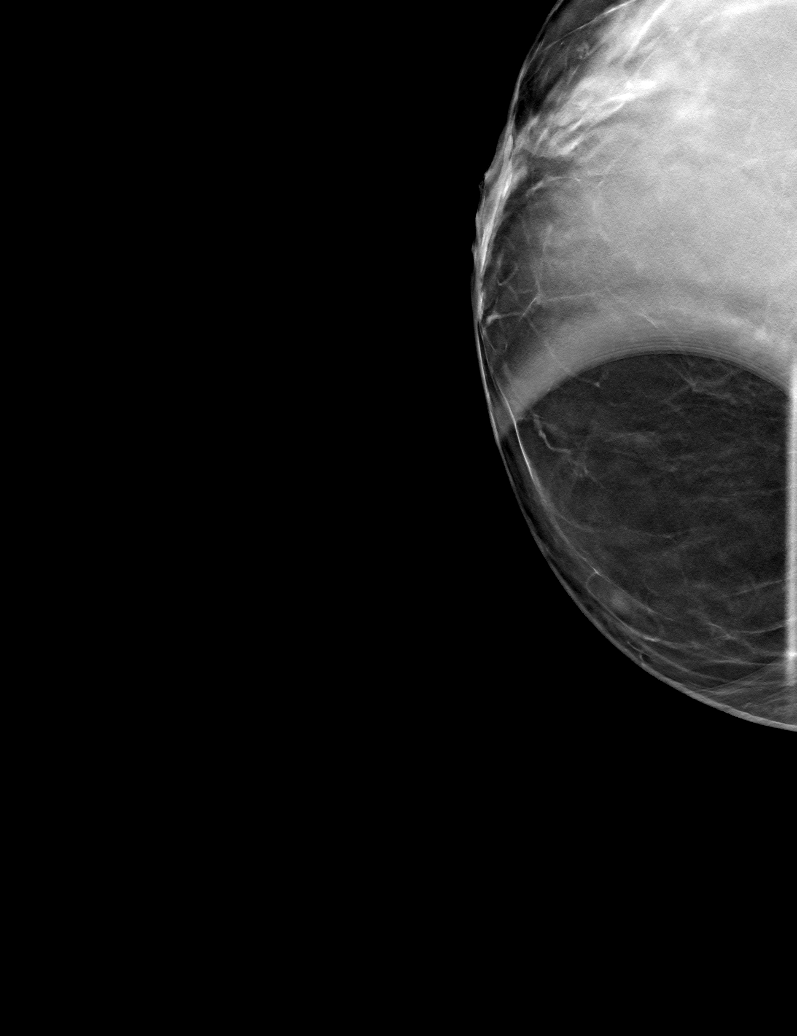

[6 of 18 positions shown; findings below may reference images not displayed]

ACR Breast Density Category b: There are scattered areas of
fibroglandular density.
FINDINGS: Within the medial right breast there is a persistent oval mass with
suggestion of internal fat density.

Targeted ultrasound is performed, showing a 3 x 4 x 3 mm oval
circumscribed hypoechoic mass right breast 3 o'clock position 7 cm
from nipple.
IMPRESSION: Right breast mass favored to represent a focal area of fat necrosis
given the mammographic appearance.

RECOMMENDATION:
Right breast mammogram and ultrasound in 3 months to reassess the
probably benign right breast mass, potentially representing fat
necrosis.

I have discussed the findings and recommendations with the patient.
If applicable, a reminder letter will be sent to the patient
regarding the next appointment.

BI-RADS CATEGORY  3: Probably benign.

## 2021-12-25 ENCOUNTER — Telehealth: Payer: Self-pay | Admitting: *Deleted

## 2021-12-25 NOTE — Telephone Encounter (Signed)
Received a call from [facility] with [employer] requesting a order for a replacement CPAP machine. Apparently it was supposed to have been ordered last July but I never received a request to order it.

## 2022-01-02 DIAGNOSIS — G4733 Obstructive sleep apnea (adult) (pediatric): Secondary | ICD-10-CM | POA: Diagnosis not present

## 2022-01-30 DIAGNOSIS — G4733 Obstructive sleep apnea (adult) (pediatric): Secondary | ICD-10-CM | POA: Diagnosis not present

## 2022-03-02 DIAGNOSIS — G4733 Obstructive sleep apnea (adult) (pediatric): Secondary | ICD-10-CM | POA: Diagnosis not present

## 2022-03-13 ENCOUNTER — Other Ambulatory Visit: Payer: Self-pay | Admitting: Family Medicine

## 2022-03-13 DIAGNOSIS — N6320 Unspecified lump in the left breast, unspecified quadrant: Secondary | ICD-10-CM

## 2022-03-20 ENCOUNTER — Encounter: Payer: Self-pay | Admitting: Cardiovascular Disease

## 2022-03-20 ENCOUNTER — Ambulatory Visit: Payer: Medicare Other | Admitting: Cardiovascular Disease

## 2022-03-20 VITALS — BP 130/70 | HR 58 | Ht 67.0 in | Wt 196.8 lb

## 2022-03-20 DIAGNOSIS — I10 Essential (primary) hypertension: Secondary | ICD-10-CM

## 2022-03-20 DIAGNOSIS — I350 Nonrheumatic aortic (valve) stenosis: Secondary | ICD-10-CM

## 2022-03-20 DIAGNOSIS — J45909 Unspecified asthma, uncomplicated: Secondary | ICD-10-CM

## 2022-03-20 DIAGNOSIS — G4733 Obstructive sleep apnea (adult) (pediatric): Secondary | ICD-10-CM | POA: Diagnosis not present

## 2022-03-20 NOTE — Patient Instructions (Signed)
Medication Instructions:  ?No changes ? ?*If you need a refill on your cardiac medications before your next appointment, please call your pharmacy* ? ? ?Lab Work: ?Not needed ? ? ?Testing/Procedures: ? ?Not needed ? ?Follow-Up: ?At [facility], you and your health needs are our priority.  As part of our continuing mission to provide you with exceptional heart care, we have created designated Provider Care Teams.  These Care Teams include your primary Cardiologist (physician) and Advanced Practice Providers (APPs -  Physician Assistants and Nurse Practitioners) who all work together to provide you with the care you need, when you need it. ? ?  ? ?Your next appointment:   ?12 month(s) ? ?The format for your next appointment:   ?In Person ? ?Provider:   ?Sanda Klein, MD  ? ? ?Other Instructions ?Will contact you in regards to C-PAP ?

## 2022-03-20 NOTE — Progress Notes (Signed)
? ? ?PCP: Dr. [name] ?Primary cardiologist: Dr. Sallyanne Kuster; Dr. Claiborne Billings (Sleep) ? ? ?HPI: Charlotte [name] is a 71 y.o. female who presents for a 9 month follow-up evaluation.   ? ?Charlotte [name] has a history of asthma, hypertension, hyperlipidemia, hypothyroidism, and had been followed by Dr. Sallyanne Kuster for cardiology care.  She was referred for a sleep study due to concerns of obstructive sleep apnea.  She underwent a split-night study on 04/01/2016.  This demonstrated severe obstructive sleep apnea with an AHI of 32.7 per hour.  She had a significant positional component with supine sleep AHI at 42.4 per hour.  She was unable to achieve any REM sleep on the baseline portion of the study.  CPAP titration was initiated titrated up to 11 cm water pressure.  During her study .  She had soft snoring.  There was a significantly abnormal arousal index area.  She was in sinus rhythm and had a rare P EC.  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 ? ?She was set up with CPAP on 05/03/2016. A download was obtained from 05/20/2016 through August 1 17.  She had only 27% of days of usage and only 17% of days greater than 4 hours of use.  She was only averaging 4 hours and 24 minutes.  On 11 cm water pressure, her AHI was 4.0, with an apnea index was 3.8 with a hypopnea index of 0.2.  Since she underwent shoulder surgery  she has been sleeping in a recliner since her surgery.  She has had pain in her shoulder which has been limiting her sleep. ? ?When I saw her in August 2017 an Epworth sleepiness scale score was calculated  as shown below and this endorsed at 16 compatible with significant excessive daytime sleepiness. ? ?Epworth Sleepiness Scale: ?Situation   Chance of Dozing/Sleeping (0 = never , 1 = slight chance , 2 = moderate chance , 3 = high chance )  ? sitting and reading 2  ? watching TV 2  ? sitting inactive in a public place 2  ? being a passenger in a motor  vehicle for an hour or more 2  ? lying down in the afternoon 2  ? sitting and talking to someone 2  ? sitting quietly after lunch (no alcohol) 2  ? while stopped for a few minutes in traffic as the driver 2  ? Total Score  16  ? ?She was evaluated by me in July 2019.  She had  been seen by Charlotte Peru, PsyD for a neurology evaluation there has been some issues with cognitive functioning.  During that evaluation it was recommended that with complaints of poor sleep the fact that she had not been using her CPAP therapy at this could be exacerbating underlying dementia.  She had stopped CPAP for some time.  During that evaluation stated that she would try to reinstitute therapy.  Download was obtained 1 month later which did show improve use but she was still not meeting compliance standards with 73% of usage days and only 60% of usage greater than 4 hours.  AHI was 3.2.  She was set on a total device with a range of 7-20 with 95th percentile pressure 11.6.  ? ?I saw her June 2021 and since her prior evaluation is normal she needs she is now followed primarily by Dr. Serita Grammes and has not seen Dr. Recardo Evangelist. She has had issues with  increasing blood pressure and her lisinopril HCT dose was recently increased 6 months ago to 20/25 mg.  She is continue to take furosemide 20 mg daily.  I obtained a new download of her CPAP from May 29 through May 14, 2020 which again shows poor compliance with only 6 out of 30 days of usage.  Her AHI however is excellent when used at 0.6.  She continues to be on levothyroxine 50 mcg for hypothyroidism.  She is on simvastatin 40 mg for hyperlipidemia.  LDL cholesterol in April 2021 was 101.  During that evaluation, abnormal discussion with her regarding regarding the effects of untreated sleep apnea and her cardiovascular health.  Her blood pressure remained elevated and amlodipine 5 mg was added to her medical regimen.  Her LDL remains elevated despite being on simvastatin I  suggested she change to rosuvastatin 20 mg a more aggressive in therapy. ? ?Over the past year, she has done well.  She has consistently used her CPAP therapy but now admits that she feels so much better.  She is unaware of any breakthrough snoring.  An Epworth Sleepiness Scale score was calculated in the office today and this endorsed at 7 arguing against residual daytime sleepiness.  Her CPAP machine set up date was May 03, 2016.  Her machine is 3G and not 5G compatible and as result we were unable to obtain any wireless download data after April of this year when did not work now only allows 5G compatibility.  However a download was obtained from March 1 through February 16, 2021 which confirms excellent compliance.  She is averaging 7 hours and 52 minutes of CPAP use per night. Her ResMed AirSense 10 AutoSet unit is set at a minimum pressure of 6 with potential maximum of 20 cm.  Her 95th percentile pressure is 10.6 with a maximum average pressure of 11.6.  AHI is excellent at 0.8.  Since I added amlodipine last year, her blood pressure has improved.  In addition with the change to rosuvastatin, lipid studies significantly improved with most recent LDL cholesterol at 64 when drawn by Dr. Kimberly Shaw on Mar 23, 2021.  At that time, her machine was no longer wireless since it is not 5G compatible.  She qualified for new CPAP machine and I discussed with her due to supply chain issues this may take 6 months or more to obtain. ? ?Charlotte [name] received a new ResMed air sense 11 AutoSet unit on January 02, 2022.  An initial download was obtained from March 26 through March 11, 2022 and compliance is met with usage days at 77% and usage greater than 4 hours at 70%.  However average use was only 6 hours.  Her pressure setting range is from 6 to 20 cm and AHI was 0.6.  Recently, however, usage has slightly declined and on a download from April 4 through Mar 20, 2022 usage greater than 4 hours was 67%.  Average use was 6  hours.  She states this was mainly due to to allergies creating significant nasal stuffiness.  AHI was 0.6/h.  She presents for follow-up evaluation. ? ? ?Past Medical History:  ?Diagnosis Date  ? Aortic stenosis   ? mild by 2020 echo  ? Aortic valve disorder   ? Asthma 01/27/2016  ? 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 Chest pain at rest 01/27/2016  ? Chronic diastolic heart failure 04/22/2016  ? Decreased estrogen level   ? Degeneration of lumbar   intervertebral disc   ? Essential hypertension 01/27/2016  ? Exertional dyspnea 01/27/2016  ? GERD (gastroesophageal reflux disease)   ? HNP (herniated nucleus pulposus) with myelopathy, cervical 04/01/2017  ? Hypersomnolence 01/27/2016  ? Hypothyroidism 01/27/2016  ? Idiopathic scoliosis of lumbar spine   ? Impaired fasting glucose   ? Injury of superior glenoid labrum of shoulder joint 03/12/2018  ? Insomnia   ? Lumbar stenosis with neurogenic claudication 08/23/2021  ? Major depressive disorder   ? MCI (mild cognitive impairment) 03/05/2018  ? Mixed hyperlipidemia 01/27/2016  ? Murmur, cardiac 01/27/2016  ? Neuropathy   ? Non-toxic goiter   ? OSA (obstructive sleep apnea) 04/22/2016  ? nightly CPAP use  ? Osteoarthritis   ? Pain in joint of left shoulder 03/12/2018  ? Pericardial effusion 01/27/2016  ? PONV (postoperative nausea and vomiting)   ? Pre-diabetes   ? Restless legs   ? 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 Shortness of breath dyspnea   ? relating to asthma  ? Syncope 01/27/2016  ? ? ?Past Surgical History:  ?Procedure Laterality Date  ? ABDOMINAL HYSTERECTOMY    ? BACK SURGERY  08/23/2021  ? BREAST REDUCTION SURGERY  2011  ? CERVICAL DISC ARTHROPLASTY N/A 04/01/2017  ? Procedure: CERVICAL ANTERIOR DISC ARTHROPLASTY C5-C6;  Surgeon: Kristeen Miss, MD;  Location: [facility];  Service: Neurosurgery;  Laterality: N/A;  ? COLONOSCOPY    ? ESOPHAGOGASTRODUODENOSCOPY    ? EYE SURGERY    ? bilateral cataracts  ? JOINT REPLACEMENT    ?  right shoulder replacement  ? REDUCTION MAMMAPLASTY    ? REVERSE SHOULDER ARTHROPLASTY Right 08/02/2016  ? Procedure: REVERSE SHOULDER ARTHROPLASTY;  Surgeon: Netta Cedars, MD;  Location: [facility];  Service: Sophronia Simas

## 2022-03-22 ENCOUNTER — Encounter: Payer: Self-pay | Admitting: Physician Assistant

## 2022-03-24 ENCOUNTER — Encounter: Payer: Self-pay | Admitting: Cardiovascular Disease

## 2022-03-25 DIAGNOSIS — I5030 Unspecified diastolic (congestive) heart failure: Secondary | ICD-10-CM | POA: Diagnosis not present

## 2022-03-25 DIAGNOSIS — E039 Hypothyroidism, unspecified: Secondary | ICD-10-CM | POA: Diagnosis not present

## 2022-03-25 DIAGNOSIS — R7301 Impaired fasting glucose: Secondary | ICD-10-CM | POA: Diagnosis not present

## 2022-03-25 DIAGNOSIS — G3184 Mild cognitive impairment, so stated: Secondary | ICD-10-CM | POA: Diagnosis not present

## 2022-03-25 DIAGNOSIS — E782 Mixed hyperlipidemia: Secondary | ICD-10-CM | POA: Diagnosis not present

## 2022-03-25 DIAGNOSIS — I110 Hypertensive heart disease with heart failure: Secondary | ICD-10-CM | POA: Diagnosis not present

## 2022-03-25 DIAGNOSIS — G2581 Restless legs syndrome: Secondary | ICD-10-CM | POA: Diagnosis not present

## 2022-03-30 ENCOUNTER — Other Ambulatory Visit: Payer: Self-pay | Admitting: Cardiovascular Disease

## 2022-04-01 DIAGNOSIS — G4733 Obstructive sleep apnea (adult) (pediatric): Secondary | ICD-10-CM | POA: Diagnosis not present

## 2022-04-04 ENCOUNTER — Ambulatory Visit: Payer: Medicare Other | Admitting: Cardiovascular Disease

## 2022-04-05 ENCOUNTER — Ambulatory Visit: Payer: Medicare Other | Admitting: Cardiovascular Disease

## 2022-04-05 ENCOUNTER — Encounter: Payer: Self-pay | Admitting: Cardiovascular Disease

## 2022-04-05 VITALS — BP 146/79 | HR 57 | Ht 66.0 in | Wt 195.8 lb

## 2022-04-05 DIAGNOSIS — E782 Mixed hyperlipidemia: Secondary | ICD-10-CM

## 2022-04-05 DIAGNOSIS — I350 Nonrheumatic aortic (valve) stenosis: Secondary | ICD-10-CM

## 2022-04-05 DIAGNOSIS — G4733 Obstructive sleep apnea (adult) (pediatric): Secondary | ICD-10-CM | POA: Diagnosis not present

## 2022-04-05 DIAGNOSIS — R7303 Prediabetes: Secondary | ICD-10-CM

## 2022-04-05 DIAGNOSIS — I5032 Chronic diastolic (congestive) heart failure: Secondary | ICD-10-CM | POA: Diagnosis not present

## 2022-04-05 DIAGNOSIS — I10 Essential (primary) hypertension: Secondary | ICD-10-CM | POA: Diagnosis not present

## 2022-04-05 DIAGNOSIS — E039 Hypothyroidism, unspecified: Secondary | ICD-10-CM

## 2022-04-05 NOTE — Patient Instructions (Signed)
Medication Instructions:  No changes *If you need a refill on your cardiac medications before your next appointment, please call your pharmacy*   Lab Work: None ordered If you have labs (blood work) drawn today and your tests are completely normal, you will receive your results only by: [place] (if you have MyChart) OR A paper copy in the mail If you have any lab test that is abnormal or we need to change your treatment, we will call you to review the results.   Testing/Procedures: Your physician has requested that you have an echocardiogram. Echocardiography is a painless test that uses sound waves to create images of your heart. It provides your doctor with information about the size and shape of your heart and how well your heart's chambers and valves are working. You may receive an ultrasound enhancing agent through an IV if needed to better visualize your heart during the echo.This procedure takes approximately one hour. There are no restrictions for this procedure. This will take place at the [address], [address].     Follow-Up: At [facility], you and your health needs are our priority.  As part of our continuing mission to provide you with exceptional heart care, we have created designated Provider Care Teams.  These Care Teams include your primary Cardiologist (physician) and Advanced Practice Providers (APPs -  Physician Assistants and Nurse Practitioners) who all work together to provide you with the care you need, when you need it.  We recommend signing up for the patient portal called "MyChart".  Sign up information is provided on this After Visit Summary.  MyChart is used to connect with patients for Virtual Visits (Telemedicine).  Patients are able to view lab/test results, encounter notes, upcoming appointments, etc.  Non-urgent messages can be sent to your provider as well.   To learn more about what you can do with MyChart, go to [email or web address].     Your next appointment:   12 month(s)  The format for your next appointment:   In Person  Provider:   Sanda Klein, MD {
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
## 2022-04-09 ENCOUNTER — Other Ambulatory Visit: Payer: Self-pay | Admitting: Cardiovascular Disease

## 2022-04-12 ENCOUNTER — Other Ambulatory Visit: Payer: Self-pay | Admitting: Family Medicine

## 2022-04-12 ENCOUNTER — Ambulatory Visit
Admission: RE | Admit: 2022-04-12 | Discharge: 2022-04-12 | Disposition: A | Discharge status: Home / Self Care | Payer: Medicare Other | Source: Ambulatory Visit | Attending: Family Medicine | Admitting: Family Medicine

## 2022-04-12 DIAGNOSIS — N6314 Unspecified lump in the right breast, lower inner quadrant: Secondary | ICD-10-CM | POA: Diagnosis not present

## 2022-04-12 DIAGNOSIS — N6310 Unspecified lump in the right breast, unspecified quadrant: Secondary | ICD-10-CM

## 2022-04-12 DIAGNOSIS — N6320 Unspecified lump in the left breast, unspecified quadrant: Secondary | ICD-10-CM

## 2022-04-12 DIAGNOSIS — N6312 Unspecified lump in the right breast, upper inner quadrant: Secondary | ICD-10-CM | POA: Diagnosis not present

## 2022-04-12 DIAGNOSIS — R928 Other abnormal and inconclusive findings on diagnostic imaging of breast: Secondary | ICD-10-CM | POA: Diagnosis not present

## 2022-04-16 DIAGNOSIS — D3132 Benign neoplasm of left choroid: Secondary | ICD-10-CM | POA: Diagnosis not present

## 2022-04-16 DIAGNOSIS — H40011 Open angle with borderline findings, low risk, right eye: Secondary | ICD-10-CM | POA: Diagnosis not present

## 2022-04-26 ENCOUNTER — Ambulatory Visit (HOSPITAL_COMMUNITY): Payer: Medicare Other | Attending: Internal Medicine

## 2022-04-26 DIAGNOSIS — I350 Nonrheumatic aortic (valve) stenosis: Secondary | ICD-10-CM | POA: Diagnosis not present

## 2022-04-26 LAB — ECHOCARDIOGRAM COMPLETE
AR max vel: 1.64 cm2
AV Area VTI: 1.7 cm2
AV Area mean vel: 1.52 cm2
AV Mean grad: 11 mmHg
AV Peak grad: 18 mmHg
Ao pk vel: 2.12 m/s
Area-P 1/2: 3.65 cm2
S' Lateral: 2.7 cm

## 2022-05-01 ENCOUNTER — Ambulatory Visit: Payer: Medicare Other | Admitting: Physician Assistant

## 2022-05-02 DIAGNOSIS — G4733 Obstructive sleep apnea (adult) (pediatric): Secondary | ICD-10-CM | POA: Diagnosis not present

## 2022-05-07 ENCOUNTER — Ambulatory Visit: Payer: Medicare Other | Admitting: Physician Assistant

## 2022-05-14 ENCOUNTER — Encounter: Payer: Self-pay | Admitting: Physician Assistant

## 2022-05-14 ENCOUNTER — Ambulatory Visit: Payer: Medicare Other | Admitting: Physician Assistant

## 2022-05-14 VITALS — BP 116/73 | HR 62 | Resp 18 | Ht 66.0 in | Wt 196.0 lb

## 2022-05-14 DIAGNOSIS — G3184 Mild cognitive impairment, so stated: Secondary | ICD-10-CM | POA: Diagnosis not present

## 2022-05-14 NOTE — Progress Notes (Signed)
Assessment/Plan:   Mild Cognitive Impairment, etiology unclear    71 yo RH female  hypertension, hyperlipidemia,  hypothyroidism, OSA on CPAP, insomnia, dCHF, anxiety, depression and  seen today for evaluation of memory loss.  Neuropsych evaluation deemed mild cognitive impairment of unclear etiology.  Alzheimer's disease continues to be possible, although there is no concrete explanation.  She has been stable over the last 4 years, and her overall patterns were not fully consistent with Alzheimer's disease (such as strong visual memory in 2019 and now).  This needs to continue to be monitored.   Recommendations:  Continue donepezil 10 mg daily Side effects were discussed Repeat Neuropsych evaluation in 12 to 18 months Continue to monitor mood by PCP Continue to use CPAP Continue to monitor cardiovascular risk factors Follow up in 6 months.   Case discussed with Dr. Delice Lesch who agrees with the plan     Subjective:    Charlotte [name] is a very pleasant 71 y.o. RH female  seen today in follow up for memory loss.  Previous records as well as any outside records available were reviewed prior to todays visit.  Patient was last seen at our office on 10/31/21. MoCA on 05/2021 was 17/30 .Patient is currently on donepezil 10 mg daily, tolerating well.   Any changes in memory since last visit?  "I am doing well, l no changes ".  She reports that she likes to do yard work, goes to [employer], needs friends, she also likes to walk a mile a day.  She is not keen to do any crossword puzzles or word finding.  However, she reads significant amount of books. Patient lives with: Patient lives alone repeats oneself? Denies Disoriented when walking into a room?  Patient denies   Leaving objects in unusual places?  Patient denies   Ambulates  with difficulty?   Patient denies   Recent falls?  Patient denies   Any head injuries?  Patient denies   History of seizures?   Patient denies   Wandering  behavior?  Patient denies   Patient drives? No issues Any mood changes?  Patient denies   Any history of depression?:  Patient denies   Hallucinations?  Patient denies   Paranoia?  Patient denies   Patient reports that she sleeps well without vivid dreams, REM behavior or sleepwalking   History of sleep apnea? On CPAP for the last 4 years, compliant  Any hygiene concerns?  Patient denies   Independent of bathing and dressing?  Endorsed  Does the patient needs help with medications?  Patient is in charge, denies forgetting doses. Who is in charge of the finances?  Patient is in charge    Any changes in appetite?  Patient denies   Patient have trouble swallowing? Patient denies   Does the patient cook?  Patient denies   Any kitchen accidents such as leaving the stove on? Patient denies   Any headaches?  Patient denies   Double vision? Patient denies   Any focal numbness or tingling?  Patient denies   Chronic back pain Patient denies   Unilateral weakness?  Patient denies   Any tremors?  Patient denies   Any history of anosmia?  Patient denies   Any incontinence of urine?  Patient denies   Any bowel dysfunction?   Patient denies    Initial Visit 06/13/21  The patient is seen in neurologic consultation at the request of [name], MD for the evaluation of memory.  The patient  is here alone.  She is a 71 year old woman who had memory issues for about 3 years.  In 2/ 2019, the patient was seen by neuropsychology, [name] and after extensive work-up, she was diagnosed with mild dementia, rule out Alzheimer's disease.  She was placed on Aricept 10 mg daily, and she is tolerating it well.  In addition, other issues contributed to her memory loss were mild major depressive disorder, and moderate generalized anxiety disorder. At the time, she was forgetting appointments, including jury duty twice in the Fall of 2018 even after being reminded on the same day.  She also was forgetting details  of recent conversations, forgetting recent events, repeating questions and statements, misplacing and losing things, word finding difficulty, and occasional difficulty following a conversation or comprehending what is being told to her.  She was not appearing to have any significant difficulty with attention and concentration, or visual spatial navigation. Psychiatry was recommended but patient declined follow up. During a visit to her PCP on 06/04/2021, she reported being "a little more forgetful ".  She was referred for further evaluation.  Although "Aricept helps, it feels that it is taking me a little longer to get the words out ".  She gets to her room, and does not remember what she was coming there for.  The patient lives alone, but her sister lives next door, and monitors any changes.  She has been dealing with some depression, but this is not worse than prior.  She denies irritability.  Her sleep is poor because she has to "get up a lot at night to go to the bathroom and wear a CPAP so uncomfortable".She does report vivid dreams, she calls them "crazy dreams ", and at times she acts them out.  She denies sleepwalking, although her dead husband used to tell her that she used to sleepwalk at times.  Denies hallucinations or paranoia.  She denies any issues with bathing and dressing.  Occasionally she forgets to take her medication.  She takes care of her own finances, without forgetting to pay any bills.  She denies leaving objects in unusual places.  Her appetite is good, denies trouble swallowing, or sialorrhea.  She cooks, and denies leaving the stove or the faucet on, or forgetting common recipes.  She ambulates without difficulty without a walker or a cane.  She denies any falls.  She has a history of right leg neuropathy, followed by Dr. Hardin Negus, otherwise no focal numbness or tingling, unilateral weakness, but she does have mild tremors, which she reports having them for many years, and they have not  gotten worse.  She does not yet drop any objects or have any trouble writing or utilizing utensils.  She continues to drive without getting lost, she uses frequently the GPS.  She denies any headaches, or falls.  She denies any injuries to the head, double vision or dizziness.  Denies urine incontinence during the day.  She denies constipation or diarrhea.  Denies anosmia.  Denies any history of alcohol or tobacco.  Family history significant for dementia in maternal grandmother and 1 maternal aunt.  She is retired from the [employer] since 2013.  She is widowed since 2010.  She has 3 daughters and 5 grandchildren.   Neuropsych evaluation Dr. Melvyn Novas 10/19/21  "Results suggested severe impairment surrounding all aspects of verbal memory, as well as verbal fluency (phonemic somewhat worse than semantic). Performance variability but generally below expectation performances were also exhibited across the  domain of executive functioning. Relative to her previous evaluation in 2019, nearly all cognitive performances were stable. Subtle decline was seen across one memory task (list learning), as well as aspects of both phonemic and semantic fluency. However, outside of this, no clinically significant decline was captured between her previous evaluation and current test performances. The etiology for ongoing impairment is unclear. Recent neuroimaging did not reveal any significant vascular risk factors and Ms. Tompson does not demonstrate strong behavioral features of Lewy body disease or frontotemporal lobar degeneration, making vascular dementia, Lewy body dementia, and frontotemporal dementia unlikely. Given significant impairment across verbal memory, continued considerations surrounding Alzheimer's disease as the underlying cause are certainly warranted, especially in the absence of a viable alternative. With that being said, it is also worth highlighting that Ms. Brandy's profile is not consistent with  typically presenting Alzheimer's disease.      Labs 06/04/21:  A1C 6.5,  Lipid panel normal, TSH 2.29 nl,  CMP normal    MRI brain 03/21/18 w and wo contrast  1. Normal MRI appearance of the brain for age. 2. 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   Brain MRI on 06/28/2021 revealed mild generalized cerebral atrophy and minimal small vessel ischemic changes.  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 04/01/2017   Chest pain at rest 01/27/2016   Chronic diastolic heart failure 16/08/9603   Decreased estrogen level    Degeneration of lumbar intervertebral disc    Essential hypertension 01/27/2016   Exertional dyspnea 01/27/2016   GERD (gastroesophageal reflux disease)    HNP (herniated nucleus pulposus) with myelopathy, cervical 04/01/2017   Hypersomnolence 01/27/2016   Hypothyroidism 01/27/2016   Idiopathic scoliosis of lumbar spine    Impaired fasting glucose    Injury of superior glenoid labrum of shoulder joint 03/12/2018   Insomnia    Lumbar stenosis with neurogenic claudication 08/23/2021   Major depressive disorder    MCI (mild cognitive impairment) 03/05/2018   Mixed hyperlipidemia 01/27/2016   Murmur, cardiac 01/27/2016   Neuropathy    Non-toxic goiter    OSA (obstructive sleep apnea) 04/22/2016   nightly CPAP use   Osteoarthritis    Pain in joint of left shoulder 03/12/2018   Pericardial effusion 01/27/2016   PONV (postoperative nausea and vomiting)    Pre-diabetes    Restless legs    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   Shortness of breath dyspnea    relating to asthma   Syncope 01/27/2016     Past Surgical History:  Procedure Laterality Date   ABDOMINAL HYSTERECTOMY     BACK SURGERY  08/23/2021   BREAST REDUCTION SURGERY  2011   CERVICAL DISC ARTHROPLASTY N/A 04/01/2017   Procedure:  CERVICAL ANTERIOR DISC ARTHROPLASTY C5-C6;  Surgeon: Kristeen Miss, MD;  Location: [facility];  Service: Neurosurgery;  Laterality: N/A;   COLONOSCOPY     ESOPHAGOGASTRODUODENOSCOPY     EYE SURGERY     bilateral cataracts   JOINT REPLACEMENT     right shoulder replacement   REDUCTION MAMMAPLASTY     REVERSE SHOULDER ARTHROPLASTY Right 08/02/2016   Procedure: REVERSE SHOULDER ARTHROPLASTY;  Surgeon: Netta Cedars, MD;  Location: [facility];  Service: Orthopedics;  Laterality: Right;     PREVIOUS MEDICATIONS:   CURRENT MEDICATIONS:  Outpatient Encounter Medications as of 05/14/2022  Medication  Sig   amLODipine (NORVASC) 5 MG tablet TAKE 1 TABLET BY MOUTH  DAILY   aspirin 81 MG tablet Take 81 mg by mouth daily.   donepezil (ARICEPT) 10 MG tablet Take 10 mg by mouth at bedtime.   furosemide (LASIX) 20 MG tablet Take 1 tablet (20 mg total) by mouth daily. NEED OV.   levothyroxine (SYNTHROID, LEVOTHROID) 50 MCG tablet Take 50 mcg by mouth daily.    lisinopril-hydrochlorothiazide (ZESTORETIC) 20-25 MG tablet Take 1 tablet by mouth daily.   rOPINIRole (REQUIP) 1 MG tablet Take 1 mg by mouth 2 (two) times daily.   rosuvastatin (CRESTOR) 20 MG tablet TAKE 1 TABLET BY MOUTH  DAILY   sertraline (ZOLOFT) 100 MG tablet Take 100 mg by mouth daily.   vitamin B-12 (CYANOCOBALAMIN) 1000 MCG tablet Take 1,000 mcg by mouth daily.   albuterol (PROVENTIL HFA;VENTOLIN HFA) 108 (90 Base) MCG/ACT inhaler Inhale 2 puffs into the lungs every 6 (six) hours as needed for wheezing or shortness of breath.  (Patient not taking: Reported on 04/05/2022)   methocarbamol (ROBAXIN) 500 MG tablet Take 1 tablet (500 mg total) by mouth every 6 (six) hours as needed for muscle spasms. (Patient not taking: Reported on 05/14/2022)   rOPINIRole (REQUIP) 0.5 MG tablet Take 0.5 mg by mouth at bedtime. (Patient not taking: Reported on 04/05/2022)   TRELEGY ELLIPTA 100-62.5-25 MCG/INH AEPB Inhale 1 puff into the lungs every other day. As needed  (Patient not taking: Reported on 05/14/2022)   No facility-administered encounter medications on file as of 05/14/2022.     Objective:     PHYSICAL EXAMINATION:    VITALS:   Vitals:   05/14/22 0916  BP: 116/73  Pulse: 62  Resp: 18  SpO2: 98%  Weight: 196 lb (88.9 kg)  Height: '5\' 6"'$  (1.676 m)    GEN:  The patient appears stated age and is in NAD. HEENT:  Normocephalic, atraumatic.   Neurological examination:  General: NAD, well-groomed, appears stated age. Orientation: The patient is alert. Oriented to person, place and date Cranial nerves: There is good facial symmetry.The speech is fluent and clear. No aphasia or dysarthria. Fund of knowledge is appropriate. Recent memory impaired and remote memory is normal.  Attention and concentration are normal.  Able to name objects and repeat phrases.  Hearing is intact to conversational tone.    Sensation: Sensation is intact to light touch throughout Motor: Strength is at least antigravity x4. Tremors: none  DTR's 2/4 in UE/LE      06/13/2021    8:00 AM  Montreal Cognitive Assessment   Visuospatial/ Executive (0/5) 3  Naming (0/3) 3  Attention: Read list of digits (0/2) 2  Attention: Read list of letters (0/1) 1  Attention: Serial 7 subtraction starting at 100 (0/3) 0  Language: Repeat phrase (0/2) 1  Language : Fluency (0/1) 0  Abstraction (0/2) 1  Delayed Recall (0/5) 0  Orientation (0/6) 6  Total 17  Adjusted Score (based on education) 17        No data to display             Movement examination: Tone: There is normal tone in the UE/LE Abnormal movements:  no tremor.  No myoclonus.  No asterixis.   Coordination:  There is no decremation with RAM's. Normal finger to nose  Gait and Station: The patient has no difficulty arising out of a deep-seated chair without the use of the hands. The patient's stride length is good.  Gait  is cautious and narrow.   Thank you for allowing [place] the opportunity to participate in  the care of this nice patient. Please do not hesitate to contact us for any questions or concerns.   Total time spent on today's visit was 25 minutes dedicated to this patient today, preparing to see patient, examining the patient, ordering tests and/or medications and counseling the patient, documenting clinical information in the EHR or other health record, independently interpreting results and communicating results to the patient/family, discussing treatment and goals, answering patient's questions and coordinating care.  Cc:  [name], MD  Sharene Butters 05/16/2022 3:16 PM   Cc:  [name], MD Sharene Butters, PA-C

## 2022-06-01 DIAGNOSIS — G4733 Obstructive sleep apnea (adult) (pediatric): Secondary | ICD-10-CM | POA: Diagnosis not present

## 2022-06-19 ENCOUNTER — Other Ambulatory Visit: Payer: Self-pay | Admitting: Cardiovascular Disease

## 2022-07-02 DIAGNOSIS — G4733 Obstructive sleep apnea (adult) (pediatric): Secondary | ICD-10-CM | POA: Diagnosis not present

## 2022-08-02 DIAGNOSIS — G4733 Obstructive sleep apnea (adult) (pediatric): Secondary | ICD-10-CM | POA: Diagnosis not present

## 2022-09-01 DIAGNOSIS — G4733 Obstructive sleep apnea (adult) (pediatric): Secondary | ICD-10-CM | POA: Diagnosis not present

## 2022-10-02 DIAGNOSIS — G4733 Obstructive sleep apnea (adult) (pediatric): Secondary | ICD-10-CM | POA: Diagnosis not present

## 2022-10-16 DIAGNOSIS — G4733 Obstructive sleep apnea (adult) (pediatric): Secondary | ICD-10-CM | POA: Diagnosis not present

## 2022-10-16 DIAGNOSIS — I5030 Unspecified diastolic (congestive) heart failure: Secondary | ICD-10-CM | POA: Diagnosis not present

## 2022-10-16 DIAGNOSIS — R7301 Impaired fasting glucose: Secondary | ICD-10-CM | POA: Diagnosis not present

## 2022-10-16 DIAGNOSIS — E039 Hypothyroidism, unspecified: Secondary | ICD-10-CM | POA: Diagnosis not present

## 2022-10-16 DIAGNOSIS — I110 Hypertensive heart disease with heart failure: Secondary | ICD-10-CM | POA: Diagnosis not present

## 2022-10-16 DIAGNOSIS — E782 Mixed hyperlipidemia: Secondary | ICD-10-CM | POA: Diagnosis not present

## 2022-10-16 DIAGNOSIS — J45909 Unspecified asthma, uncomplicated: Secondary | ICD-10-CM | POA: Diagnosis not present

## 2022-10-16 DIAGNOSIS — I350 Nonrheumatic aortic (valve) stenosis: Secondary | ICD-10-CM | POA: Diagnosis not present

## 2022-10-16 DIAGNOSIS — Z0000 Encounter for general adult medical examination without abnormal findings: Secondary | ICD-10-CM | POA: Diagnosis not present

## 2022-10-16 DIAGNOSIS — R413 Other amnesia: Secondary | ICD-10-CM | POA: Diagnosis not present

## 2022-10-16 DIAGNOSIS — G2581 Restless legs syndrome: Secondary | ICD-10-CM | POA: Diagnosis not present

## 2022-11-20 ENCOUNTER — Encounter: Payer: Self-pay | Admitting: Physician Assistant

## 2022-11-20 ENCOUNTER — Ambulatory Visit: Payer: Medicare Other | Admitting: Physician Assistant

## 2022-11-20 VITALS — BP 125/75 | HR 66 | Resp 18 | Ht 66.0 in | Wt 199.0 lb

## 2022-11-20 DIAGNOSIS — G3184 Mild cognitive impairment, so stated: Secondary | ICD-10-CM

## 2022-11-20 NOTE — Addendum Note (Signed)
Addended by: Elspeth Cho R on: 11/20/2022 10:09 AM   Modules accepted: Orders

## 2022-11-20 NOTE — Progress Notes (Signed)
Assessment/Plan:   Mild Cognitive Impairment  Charlotte [name] is a very pleasant 72 y.o. RH female   hypertension, hyperlipidemia,  hypothyroidism, OSA on CPAP, insomnia, dCHF, anxiety, depression and a history of Mild Cognitive Impairment of unclear etiology per Neuropsychological evaluation,  presenting today in follow-up for evaluation of memory loss. Patient is on donepezil 10 mg daily. Personally reviewed of the brain revealed mild generalized cerebral atrophy and minimal small vessel ischemic changes. MMSE today is 30/30. Stable from the cognitive standpoint.    Recommendations:   Follow up in 6  months. Repeat Neuropsych evaluation in 6-12 months for clarity of diagnosis and disease progression Continue CPAP for OSA Recommend good control of cardiovascular risk factors.   Continue to control mood as per PCP Continue donepezil 10 mg daily. Side effects were discussed     Subjective:   This patient is here alone. Previous records as well as any outside records available were reviewed prior to todays visit.   Patient was last seen on 6.27.23. Last MoCA on 05/2021 was 17/30    Any changes in memory since last visit?   "I can tell the difference by taking Aricept ".Able to remember recent conversations and people names, but I remember faces better. Enjoys going to [employer], meeting friends and reading. Does not do crossword puzzles or word finding.  She does jigsaw puzzles, candy crush. repeats oneself?  Denies Disoriented when walking into a room?  Patient denies   Leaving objects in unusual places?  Patient denies   Wandering behavior?   denies   Any personality changes since last visit?   denies   Any worsening depression?: denies   Hallucinations or paranoia?  denies   Seizures?   denies    Any sleep changes?  Denies vivid dreams, REM behavior or sleepwalking . Sleeps well. Sleep apnea?   Endorsed, uses CPAP for the last 5 years Any hygiene concerns?   denies    Independent of bathing and dressing?  Endorsed  Does the patient needs help with medications?  Patient is in charge Who is in charge of the finances?  Patient is in charge Any changes in appetite?  denies Patient have trouble swallowing?  denies   Does the patient cook? Endorsed   Any kitchen accidents such as leaving the stove on?   denies   Any headaches?  denies   Vision changes? denies Chronic back pain she had a history of lumbar stenosis s/p surgery 2022 with plate placement, still experiences mild discomfort, stiff, but no sciatica pain.  Ambulates with difficulty?    denies   Recent falls or head injuries?   denies     Unilateral weakness, numbness or tingling?  denies   Any tremors?   Occasionally she reports mild tremors, but "nothing major". Able to hold objects without problems.  Any anosmia?    denies   Any incontinence of urine?  denies   Any bowel dysfunction?  denies      Patient lives  alone Does the patient drive?  Endorsed, denies any issues.   Initial Visit 06/13/21  The patient is seen in neurologic consultation at the request of [name], MD for the evaluation of memory.  The patient is here alone.  She is a 72 year old woman who had memory issues for about 3 years.  In 2/ 2019, the patient was seen by neuropsychology, [name] and after extensive work-up, she was diagnosed with mild dementia, rule out Alzheimer's disease.  She was  placed on Aricept 10 mg daily, and she is tolerating it well.  In addition, other issues contributed to her memory loss were mild major depressive disorder, and moderate generalized anxiety disorder. At the time, she was forgetting appointments, including jury duty twice in the Fall of 2018 even after being reminded on the same day.  She also was forgetting details of recent conversations, forgetting recent events, repeating questions and statements, misplacing and losing things, word finding difficulty, and occasional difficulty  following a conversation or comprehending what is being told to her.  She was not appearing to have any significant difficulty with attention and concentration, or visual spatial navigation. Psychiatry was recommended but patient declined follow up. During a visit to her PCP on 06/04/2021, she reported being "a little more forgetful ".  She was referred for further evaluation.  Although "Aricept helps, it feels that it is taking me a little longer to get the words out ".  She gets to her room, and does not remember what she was coming there for.  The patient lives alone, but her sister lives next door, and monitors any changes.  She has been dealing with some depression, but this is not worse than prior.  She denies irritability.  Her sleep is poor because she has to "get up a lot at night to go to the bathroom and wear a CPAP so uncomfortable".She does report vivid dreams, she calls them "crazy dreams ", and at times she acts them out.  She denies sleepwalking, although her dead husband used to tell her that she used to sleepwalk at times.  Denies hallucinations or paranoia.  She denies any issues with bathing and dressing.  Occasionally she forgets to take her medication.  She takes care of her own finances, without forgetting to pay any bills.  She denies leaving objects in unusual places.  Her appetite is good, denies trouble swallowing, or sialorrhea.  She cooks, and denies leaving the stove or the faucet on, or forgetting common recipes.  She ambulates without difficulty without a walker or a cane.  She denies any falls.  She has a history of right leg neuropathy, followed by Dr. Hardin Negus, otherwise no focal numbness or tingling, unilateral weakness, but she does have mild tremors, which she reports having them for many years, and they have not gotten worse.  She does not yet drop any objects or have any trouble writing or utilizing utensils.  She continues to drive without getting lost, she uses frequently the  [employer].  She denies any headaches, or falls.  She denies any injuries to the head, double vision or dizziness.  Denies urine incontinence during the day.  She denies constipation or diarrhea.  Denies anosmia.  Denies any history of alcohol or tobacco.  Family history significant for dementia in maternal grandmother and 1 maternal aunt.  She is retired from the [employer] since 2013.  She is widowed since 2010.  She has 3 daughters and 5 grandchildren.   Neuropsych evaluation Dr. Melvyn Novas 10/19/21  "Results suggested severe impairment surrounding all aspects of verbal memory, as well as verbal fluency (phonemic somewhat worse than semantic). Performance variability but generally below expectation performances were also exhibited across the domain of executive functioning. Relative to her previous evaluation in 2019, nearly all cognitive performances were stable. Subtle decline was seen across one memory task (list learning), as well as aspects of both phonemic and semantic fluency. However, outside of this, no clinically significant decline was  captured between her previous evaluation and current test performances. The etiology for ongoing impairment is unclear. Recent neuroimaging did not reveal any significant vascular risk factors and Charlotte [name] does not demonstrate strong behavioral features of Lewy body disease or frontotemporal lobar degeneration, making vascular dementia, Lewy body dementia, and frontotemporal dementia unlikely. Given significant impairment across verbal memory, continued considerations surrounding Alzheimer's disease as the underlying cause are certainly warranted, especially in the absence of a viable alternative. With that being said, it is also worth highlighting that Charlotte [name]'s profile is not consistent with typically presenting Alzheimer's disease.      Labs 06/04/21:  A1C 6.5,  Lipid panel normal, TSH 2.29 nl,  CMP normal    MRI brain 03/21/18 w and wo contrast  1. Normal MRI  appearance of the brain for age. 2. 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   Brain MRI on 06/28/2021 revealed mild generalized cerebral atrophy and minimal small vessel ischemic changes.    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 04/01/2017   Chest pain at rest 01/27/2016   Chronic diastolic heart failure 85/12/7739   Decreased estrogen level    Degeneration of lumbar intervertebral disc    Essential hypertension 01/27/2016   Exertional dyspnea 01/27/2016   GERD (gastroesophageal reflux disease)    HNP (herniated nucleus pulposus) with myelopathy, cervical 04/01/2017   Hypersomnolence 01/27/2016   Hypothyroidism 01/27/2016   Idiopathic scoliosis of lumbar spine    Impaired fasting glucose    Injury of superior glenoid labrum of shoulder joint 03/12/2018   Insomnia    Lumbar stenosis with neurogenic claudication 08/23/2021   Major depressive disorder    MCI (mild cognitive impairment) 03/05/2018   Mixed hyperlipidemia 01/27/2016   Murmur, cardiac 01/27/2016   Neuropathy    Non-toxic goiter    OSA (obstructive sleep apnea) 04/22/2016   nightly CPAP use   Osteoarthritis    Pain in joint of left shoulder 03/12/2018   Pericardial effusion 01/27/2016   PONV (postoperative nausea and vomiting)    Pre-diabetes    Restless legs    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   Shortness of breath dyspnea    relating to asthma   Syncope 01/27/2016     Past Surgical History:  Procedure Laterality Date   ABDOMINAL HYSTERECTOMY     BACK SURGERY  08/23/2021   BREAST REDUCTION SURGERY  2011   CERVICAL DISC ARTHROPLASTY N/A 04/01/2017   Procedure: CERVICAL ANTERIOR DISC ARTHROPLASTY C5-C6;  Surgeon: Kristeen Miss, MD;  Location: [facility];  Service: Neurosurgery;  Laterality: N/A;   COLONOSCOPY     ESOPHAGOGASTRODUODENOSCOPY      EYE SURGERY     bilateral cataracts   JOINT REPLACEMENT     right shoulder replacement   REDUCTION MAMMAPLASTY     REVERSE SHOULDER ARTHROPLASTY Right 08/02/2016   Procedure: REVERSE SHOULDER ARTHROPLASTY;  Surgeon: Netta Cedars, MD;  Location: [facility];  Service: Orthopedics;  Laterality: Right;     PREVIOUS MEDICATIONS:   CURRENT MEDICATIONS:  Outpatient Encounter Medications as of 11/20/2022  Medication Sig   amLODipine (NORVASC) 5 MG tablet TAKE 1 TABLET BY MOUTH  DAILY   aspirin 81 MG tablet Take 81 mg by mouth daily.   donepezil (ARICEPT) 10 MG tablet Take 10 mg by mouth at bedtime.   furosemide (  LASIX) 20 MG tablet Take 1 tablet (20 mg total) by mouth daily. NEED OV.   levothyroxine (SYNTHROID, LEVOTHROID) 50 MCG tablet Take 50 mcg by mouth daily.    lisinopril-hydrochlorothiazide (ZESTORETIC) 20-25 MG tablet Take 1 tablet by mouth daily.   rOPINIRole (REQUIP) 1 MG tablet Take 1 mg by mouth 2 (two) times daily.   rosuvastatin (CRESTOR) 20 MG tablet TAKE 1 TABLET BY MOUTH DAILY   sertraline (ZOLOFT) 100 MG tablet Take 100 mg by mouth daily.   TRELEGY ELLIPTA 100-62.5-25 MCG/INH AEPB Inhale 1 puff into the lungs every other day. As needed   vitamin B-12 (CYANOCOBALAMIN) 1000 MCG tablet Take 1,000 mcg by mouth daily.   albuterol (PROVENTIL HFA;VENTOLIN HFA) 108 (90 Base) MCG/ACT inhaler Inhale 2 puffs into the lungs every 6 (six) hours as needed for wheezing or shortness of breath.  (Patient not taking: Reported on 04/05/2022)   methocarbamol (ROBAXIN) 500 MG tablet Take 1 tablet (500 mg total) by mouth every 6 (six) hours as needed for muscle spasms. (Patient not taking: Reported on 05/14/2022)   rOPINIRole (REQUIP) 0.5 MG tablet Take 0.5 mg by mouth at bedtime. (Patient not taking: Reported on 04/05/2022)   No facility-administered encounter medications on file as of 11/20/2022.     Objective:     PHYSICAL EXAMINATION:    VITALS:   Vitals:   11/20/22 0848  BP: 125/75  Pulse:  66  Resp: 18  SpO2: 96%  Weight: 199 lb (90.3 kg)  Height: '5\' 6"'$  (1.676 m)    GEN:  The patient appears stated age and is in NAD. HEENT:  Normocephalic, atraumatic.   Neurological examination:  General: NAD, well-groomed, appears stated age. Orientation: The patient is alert. Oriented to person, place and date Cranial nerves: There is good facial symmetry.The speech is fluent and clear. No aphasia or dysarthria. Fund of knowledge is appropriate. Recent  and remote memory is normal.  Attention and concentration are normal.  Able to name objects and repeat phrases.  Hearing is intact to conversational tone.   Delayed recall 3/3 Sensation: Sensation is intact to light touch throughout Motor: Strength is at least antigravity x4. Tremors: none  DTR's 2/4 in UE/LE      06/13/2021    8:00 AM  Montreal Cognitive Assessment   Visuospatial/ Executive (0/5) 3  Naming (0/3) 3  Attention: Read list of digits (0/2) 2  Attention: Read list of letters (0/1) 1  Attention: Serial 7 subtraction starting at 100 (0/3) 0  Language: Repeat phrase (0/2) 1  Language : Fluency (0/1) 0  Abstraction (0/2) 1  Delayed Recall (0/5) 0  Orientation (0/6) 6  Total 17  Adjusted Score (based on education) 17       11/20/2022   10:00 AM  MMSE - Mini Mental State Exam  Orientation to time 5  Orientation to Place 5  Registration 3  Attention/ Calculation 5  Recall 3  Language- name 2 objects 2  Language- repeat 1  Language- follow 3 step command 3  Language- read & follow direction 1  Write a sentence 1  Copy design 1  Total score 30       Movement examination: Tone: There is normal tone in the UE/LE Abnormal movements:  no tremor.  No myoclonus.  No asterixis.   Coordination:  There is no decremation with RAM's. Normal finger to nose  Gait and Station: The patient has no difficulty arising out of a deep-seated chair without the use of the hands.  The patient's stride length is good.  Gait is  cautious and narrow.   Thank you for allowing [place] the opportunity to participate in the care of this nice patient. Please do not hesitate to contact us for any questions or concerns.   Total time spent on today's visit was 23 minutes dedicated to this patient today, preparing to see patient, examining the patient, ordering tests and/or medications and counseling the patient, documenting clinical information in the EHR or other health record, independently interpreting results and communicating results to the patient/family, discussing treatment and goals, answering patient's questions and coordinating care.  Cc:  [name], MD  Sharene Butters 11/20/2022 10:04 AM

## 2022-11-20 NOTE — Patient Instructions (Signed)
It was a pleasure to see you today at our office.   Recommendations:  Follow up in 6  months Continue donepezil 10 mg daily.   Maintain good cardiovascular status Repeat the neuropsychological evaluation in 6-12 months  Continue CPAP  Whom to call:  Memory  decline, memory medications: Call our office [phone]   For psychiatric meds, mood meds: Please have your primary care physician manage these medications.  If you have any severe symptoms of a stroke, or other severe issues such as confusion,severe chills or fever, etc call 911 or go to the ER as you may need to be evaluated further  You have been referred for a neuropsychological evaluation (i.e., evaluation of memory and thinking abilities). Please bring someone with you to this appointment if possible, as it is helpful for the doctor to hear from both you and another adult who knows you well. Please bring eyeglasses and hearing aids if you wear them.    The evaluation will take approximately 3 hours and has two parts:   The first part is a clinical interview with the neuropsychologist (Dr. Melvyn Novas or Dr. Nicole Kindred). During the interview, the neuropsychologist will speak with you and the individual you brought to the appointment.    The second part of the evaluation is testing with the doctor's technician Hinton Dyer or Maudie Mercury). During the testing, the technician will ask you to remember different types of material, solve problems, and answer some questionnaires. Your family member will not be present for this portion of the evaluation.   Please note: We must reserve several hours of the neuropsychologist's time and the psychometrician's time for your evaluation appointment. As such, there is a No-Show fee of $100. If you are unable to attend any of your appointments, please contact our office as soon as possible to reschedule.    RECOMMENDATIONS FOR ALL PATIENTS WITH MEMORY PROBLEMS: 1. Continue to exercise (Recommend 30 minutes of walking  everyday, or 3 hours every week) 2. Increase social interactions - continue going to [place] and enjoy social gatherings with friends and family 3. Eat healthy, avoid fried foods and eat more fruits and vegetables 4. Maintain adequate blood pressure, blood sugar, and blood cholesterol level. Reducing the risk of stroke and cardiovascular disease also helps promoting better memory. 5. Avoid stressful situations. Live a simple life and avoid aggravations. Organize your time and prepare for the next day in anticipation. 6. Sleep well, avoid any interruptions of sleep and avoid any distractions in the bedroom that may interfere with adequate sleep quality 7. Avoid sugar, avoid sweets as there is a strong link between excessive sugar intake, diabetes, and cognitive impairment We discussed the Mediterranean diet, which has been shown to help patients reduce the risk of progressive memory disorders and reduces cardiovascular risk. This includes eating fish, eat fruits and green leafy vegetables, nuts like almonds and hazelnuts, walnuts, and also use olive oil. Avoid fast foods and fried foods as much as possible. Avoid sweets and sugar as sugar use has been linked to worsening of memory function.  There is always a concern of gradual progression of memory problems. If this is the case, then we may need to adjust level of care according to patient needs. Support, both to the patient and caregiver, should then be put into place.    The Alzheimer's Association is here all day, every day for people facing Alzheimer's disease through our free 24/7 Helpline: 743-039-2178. The Helpline provides reliable information and support to all those who  need assistance, such as individuals living with memory loss, Alzheimer's or other dementia, caregivers, health care professionals and the public.  Our highly trained and knowledgeable staff can help you with: Understanding memory loss, dementia and Alzheimer's  Medications  and other treatment options  General information about aging and brain health  Skills to provide quality care and to find the best care from professionals  Legal, financial and living-arrangement decisions Our Helpline also features: Confidential care consultation provided by master's level clinicians who can help with decision-making support, crisis assistance and education on issues families face every day  Help in a caller's preferred language using our translation service that features more than 200 languages and dialects  Referrals to local community programs, services and ongoing support     FALL PRECAUTIONS: Be cautious when walking. Scan the area for obstacles that may increase the risk of trips and falls. When getting up in the mornings, sit up at the edge of the bed for a few minutes before getting out of bed. Consider elevating the bed at the head end to avoid drop of blood pressure when getting up. Walk always in a well-lit room (use night lights in the walls). Avoid area rugs or power cords from appliances in the middle of the walkways. Use a walker or a cane if necessary and consider physical therapy for balance exercise. Get your eyesight checked regularly.  FINANCIAL OVERSIGHT: Supervision, especially oversight when making financial decisions or transactions is also recommended.  HOME SAFETY: Consider the safety of the kitchen when operating appliances like stoves, microwave oven, and blender. Consider having supervision and share cooking responsibilities until no longer able to participate in those. Accidents with firearms and other hazards in the house should be identified and addressed as well.   ABILITY TO BE LEFT ALONE: If patient is unable to contact 911 operator, consider using LifeLine, or when the need is there, arrange for someone to stay with patients. Smoking is a fire hazard, consider supervision or cessation. Risk of wandering should be assessed by caregiver and if  detected at any point, supervision and safe proof recommendations should be instituted.  MEDICATION SUPERVISION: Inability to self-administer medication needs to be constantly addressed. Implement a mechanism to ensure safe administration of the medications.   DRIVING: Regarding driving, in patients with progressive memory problems, driving will be impaired. We advise to have someone else do the driving if trouble finding directions or if minor accidents are reported. Independent driving assessment is available to determine safety of driving.   If you are interested in the driving assessment, you can contact the following:  [employer] in [place]  [place] [phone] or [phone]      [place] refers to food and lifestyle choices that are based on the traditions of countries located on the [employer]. This way of eating has been shown to help prevent certain conditions and improve outcomes for people who have chronic diseases, like kidney disease and heart disease. What are tips for following this plan? Lifestyle  Cook and eat meals together with your family, when possible. Drink enough fluid to keep your urine clear or pale yellow. Be physically active every day. This includes: Aerobic exercise like running or swimming. Leisure activities like gardening, walking, or housework. Get 7-8 hours of sleep each night. If recommended by your health care provider, drink red wine in moderation. This means 1 glass  a day for nonpregnant women and 2 glasses a day for men. A glass of wine equals 5 oz (150 mL). Reading food labels  Check the serving size of packaged foods. For foods such as rice and pasta, the serving size refers to the amount of cooked product, not dry. Check the total fat in packaged foods. Avoid foods that have  saturated fat or trans fats. Check the ingredients list for added sugars, such as corn syrup. Shopping  At the grocery store, buy most of your food from the areas near the walls of the store. This includes: Fresh fruits and vegetables (produce). Grains, beans, nuts, and seeds. Some of these may be available in unpackaged forms or large amounts (in bulk). Fresh seafood. Poultry and eggs. Low-fat dairy products. Buy whole ingredients instead of prepackaged foods. Buy fresh fruits and vegetables in-season from local farmers markets. Buy frozen fruits and vegetables in resealable bags. If you do not have access to quality fresh seafood, buy precooked frozen shrimp or canned fish, such as tuna, salmon, or sardines. Buy small amounts of raw or cooked vegetables, salads, or olives from the deli or salad bar at your store. Stock your pantry so you always have certain foods on hand, such as olive oil, canned tuna, canned tomatoes, rice, pasta, and beans. Cooking  Cook foods with extra-virgin olive oil instead of using butter or other vegetable oils. Have meat as a side dish, and have vegetables or grains as your main dish. This means having meat in small portions or adding small amounts of meat to foods like pasta or stew. Use beans or vegetables instead of meat in common dishes like chili or lasagna. Experiment with different cooking methods. Try roasting or broiling vegetables instead of steaming or sauteing them. Add frozen vegetables to soups, stews, pasta, or rice. Add nuts or seeds for added healthy fat at each meal. You can add these to yogurt, salads, or vegetable dishes. Marinate fish or vegetables using olive oil, lemon juice, garlic, and fresh herbs. Meal planning  Plan to eat 1 vegetarian meal one day each week. Try to work up to 2 vegetarian meals, if possible. Eat seafood 2 or more times a week. Have healthy snacks readily available, such as: Vegetable sticks with hummus. Greek  yogurt. Fruit and nut trail mix. Eat balanced meals throughout the week. This includes: Fruit: 2-3 servings a day Vegetables: 4-5 servings a day Low-fat dairy: 2 servings a day Fish, poultry, or lean meat: 1 serving a day Beans and legumes: 2 or more servings a week Nuts and seeds: 1-2 servings a day Whole grains: 6-8 servings a day Extra-virgin olive oil: 3-4 servings a day Limit red meat and sweets to only a few servings a month What are my food choices? Mediterranean diet Recommended Grains: Whole-grain pasta. Brown rice. Bulgar wheat. Polenta. Couscous. Whole-wheat bread. [place]. Vegetables: Artichokes. Beets. Broccoli. Cabbage. Carrots. Eggplant. Green beans. Chard. Kale. Spinach. Onions. Leeks. Peas. Squash. Tomatoes. Peppers. Radishes. Fruits: Apples. Apricots. Avocado. Berries. Bananas. Cherries. Dates. Figs. Grapes. Lemons. Melon. Oranges. Peaches. Plums. Pomegranate. Meats and other protein foods: Beans. Almonds. Sunflower seeds. Pine nuts. Peanuts. [place]. Salmon. Scallops. Shrimp. [place]. Tilapia. Clams. Oysters. Eggs. Dairy: Low-fat milk. Cheese. Greek yogurt. Beverages: Water. Red wine. Herbal tea. Fats and oils: Extra virgin olive oil. Avocado oil. Grape seed oil. Sweets and desserts: [place] yogurt with honey. Baked apples. Poached pears. Trail mix. Seasoning and other foods: Basil. Cilantro. Coriander. Cumin. Mint. Parsley. Sage. Rosemary. Tarragon. Garlic. Oregano.  Thyme. Pepper. Balsalmic vinegar. Tahini. Hummus. Tomato sauce. Olives. Mushrooms. Limit these Grains: Prepackaged pasta or rice dishes. Prepackaged cereal with added sugar. Vegetables: Deep fried potatoes (french fries). Fruits: Fruit canned in syrup. Meats and other protein foods: Beef. Pork. Lamb. Poultry with skin. Hot dogs. [place] Salines. Dairy: Ice cream. Sour cream. Whole milk. Beverages: Juice. Sugar-sweetened soft drinks. Beer. Liquor and spirits. Fats and oils: Butter. Canola oil. Vegetable oil. Beef  fat (tallow). Lard. Sweets and desserts: Cookies. Cakes. Pies. Candy. Seasoning and other foods: Mayonnaise. Premade sauces and marinades. The items listed may not be a complete list. Talk with your dietitian about what dietary choices are right for you. Summary The Mediterranean diet includes both food and lifestyle choices. Eat a variety of fresh fruits and vegetables, beans, nuts, seeds, and whole grains. Limit the amount of red meat and sweets that you eat. Talk with your health care provider about whether it is safe for you to drink red wine in moderation. This means 1 glass a day for nonpregnant women and 2 glasses a day for men. A glass of wine equals 5 oz (150 mL). This information is not intended to replace advice given to you by your health care provider. Make sure you discuss any questions you have with your health care provider. Document Released: 06/27/2016 Document Revised: 07/30/2016 Document Reviewed: 06/27/2016 Elsevier Interactive Patient Education  2017 [employer].

## 2022-12-16 ENCOUNTER — Telehealth: Payer: Self-pay | Admitting: *Deleted

## 2022-12-16 NOTE — Progress Notes (Signed)
  Care Coordination  Outreach Note  12/16/2022 Name: Charlotte [name] MRN: [id number] DOB: [birth year]   Care Coordination Outreach Attempts: An unsuccessful telephone outreach was attempted today to offer the patient information about available care coordination services as a benefit of their health plan.   Follow Up Plan:  Additional outreach attempts will be made to offer the patient care coordination information and services.   Encounter Outcome:  No Answer  [place]  Direct Dial: [phone]

## 2022-12-16 NOTE — Progress Notes (Signed)
  Care Coordination   Note   12/16/2022 Name: Charlotte [name] MRN: [id number] DOB: [birth year]  Charlotte [name] is a 72 y.o. year old female who sees [name], MD for primary care. I reached out to [name] by phone today to offer care coordination services.  [name] was given information about Care Coordination services today including:   The Care Coordination services include support from the care team which includes your Nurse Coordinator, Clinical Social Worker, or Pharmacist.  The Care Coordination team is here to help remove barriers to the health concerns and goals most important to you. Care Coordination services are voluntary, and the patient may decline or stop services at any time by request to their care team member.   Care Coordination Consent Status: Patient agreed to services and verbal consent obtained.   Follow up plan:  Telephone appointment with care coordination team member scheduled for:  12/18/22  Encounter Outcome:  Pt. Scheduled  [place]  Direct Dial: [phone]

## 2022-12-18 ENCOUNTER — Ambulatory Visit: Payer: Self-pay

## 2022-12-18 NOTE — Patient Instructions (Signed)
Visit Information  Thank you for taking time to visit with me today. Please don't hesitate to contact me if I can be of assistance to you.   Following are the goals we discussed today:   Goals Addressed             This Visit's Progress    COMPLETED: Care Coordination Activities - no follow up required       Care Coordination Interventions: Evaluation of current treatment plan related to mild cognitive impairment, CHF, back pain and patient's adherence to plan as established by provider Advised patient to continue to exercise and discussed benefits of regular exercise for memory, pain relief and cardiovascular benefits Provided education to patient re: Mild cognitive impairment, care coordination services Reviewed medications with patient and discussed adherence and continued use of medication box Assessed social determinant of health barriers Pt declines further RNCM at this time and voices good understanding of care issues and when to call provider         If you are experiencing a Mental Health or [place] or need someone to talk to, please call the Suicide and Crisis Lifeline: 988 call the [place] National Suicide Prevention Lifeline: 978 429 6826 or TTY: (604)144-9267 TTY 4085322732) to talk to a trained counselor call 1-800-273-TALK (toll free, 24 hour hotline) go to [facility] [place] [phone]) call 911   Patient verbalizes understanding of instructions and care plan provided today and agrees to view in [place]. Active MyChart status and patient understanding of how to access instructions and care plan via MyChart confirmed with patient.     No further follow up required:    Peter Garter RN, [place] [place], [place] [facility] [phone]

## 2022-12-18 NOTE — Patient Outreach (Signed)
  Care Coordination   Initial Visit Note   12/18/2022 Name: Charlotte [name] MRN: [id number] DOB: Sep 05, 1951  Charlotte [name] is a 72 y.o. year old female who sees Charlotte Neer, MD for primary care. I spoke with  Charlotte [name] by phone today.  What matters to the patients health and wellness today?  I am doing good other than my back hurts if I stand too long.  I do not feel I need someone to call me regularly at this time States she uses a pill box she fills once a week and does not forget to take her pills.  Denies any swelling, shortness of breath or chest pains.  States her family is helpful when she needs help.  States she tries to walk every other day and is active with yard work in the summer    Goals Addressed             This [place] - no follow up required       Care Coordination Interventions: Evaluation of current treatment plan related to mild cognitive impairment, CHF, back pain and patient's adherence to plan as established by provider Advised patient to continue to exercise and discussed benefits of regular exercise for memory, pain relief and cardiovascular benefits Provided education to patient re: Mild cognitive impairment, care coordination services Reviewed medications with patient and discussed adherence and continued use of medication box Assessed social determinant of health barriers Pt declines further RNCM at this time and voices good understanding of care issues and when to call provider         SDOH assessments and interventions completed:  Yes  SDOH Interventions Today    Flowsheet Row Most Recent Value  SDOH Interventions   Food Insecurity Interventions Intervention Not Indicated  Housing Interventions Intervention Not Indicated  Transportation Interventions Intervention Not Indicated  Utilities Interventions Intervention Not Indicated  Physical Activity  Interventions Other (Comments)  [Reviewed benefits of regular exercise and insurance benefit for fitness]        Care Coordination Interventions:  Yes, provided   Follow up plan: No further intervention required. Declines RNCM follow up at this time     Encounter Outcome:  Pt. Visit Completed  Peter Garter RN, [facility], [facility] [phone]

## 2022-12-27 ENCOUNTER — Encounter: Payer: Self-pay | Admitting: Cardiovascular Disease

## 2023-01-27 ENCOUNTER — Other Ambulatory Visit: Payer: Self-pay | Admitting: Cardiovascular Disease

## 2023-03-12 ENCOUNTER — Other Ambulatory Visit: Payer: Self-pay | Admitting: Family Medicine

## 2023-03-12 DIAGNOSIS — Z0000 Encounter for general adult medical examination without abnormal findings: Secondary | ICD-10-CM

## 2023-03-28 ENCOUNTER — Other Ambulatory Visit: Payer: Self-pay | Admitting: Cardiovascular Disease

## 2023-03-29 ENCOUNTER — Encounter (HOSPITAL_BASED_OUTPATIENT_CLINIC_OR_DEPARTMENT_OTHER): Payer: Self-pay | Admitting: Emergency Medicine

## 2023-03-29 ENCOUNTER — Emergency Department (HOSPITAL_BASED_OUTPATIENT_CLINIC_OR_DEPARTMENT_OTHER): Payer: Medicare Other

## 2023-03-29 ENCOUNTER — Other Ambulatory Visit: Payer: Self-pay

## 2023-03-29 DIAGNOSIS — J209 Acute bronchitis, unspecified: Secondary | ICD-10-CM | POA: Insufficient documentation

## 2023-03-29 DIAGNOSIS — E039 Hypothyroidism, unspecified: Secondary | ICD-10-CM | POA: Insufficient documentation

## 2023-03-29 DIAGNOSIS — I5032 Chronic diastolic (congestive) heart failure: Secondary | ICD-10-CM | POA: Diagnosis not present

## 2023-03-29 DIAGNOSIS — Z20822 Contact with and (suspected) exposure to covid-19: Secondary | ICD-10-CM | POA: Diagnosis not present

## 2023-03-29 DIAGNOSIS — I110 Hypertensive heart disease with heart failure: Secondary | ICD-10-CM | POA: Diagnosis not present

## 2023-03-29 DIAGNOSIS — R059 Cough, unspecified: Secondary | ICD-10-CM | POA: Diagnosis not present

## 2023-03-29 DIAGNOSIS — J45909 Unspecified asthma, uncomplicated: Secondary | ICD-10-CM | POA: Diagnosis not present

## 2023-03-29 MED ORDER — IPRATROPIUM-ALBUTEROL 0.5-2.5 (3) MG/3ML IN SOLN
3.0000 mL | Freq: Once | RESPIRATORY_TRACT | Status: AC
  Administered 2023-03-29: 3 mL via RESPIRATORY_TRACT
  Filled 2023-03-29: qty 3

## 2023-03-29 NOTE — ED Triage Notes (Signed)
Pt c/o cough, SHOB x 1 wk; MDIs at home are not helping

## 2023-03-30 ENCOUNTER — Other Ambulatory Visit: Payer: Self-pay | Admitting: Cardiovascular Disease

## 2023-03-30 ENCOUNTER — Emergency Department (HOSPITAL_BASED_OUTPATIENT_CLINIC_OR_DEPARTMENT_OTHER)
Admission: EM | Admit: 2023-03-30 | Discharge: 2023-03-30 | Disposition: A | Discharge status: Home / Self Care | Payer: Medicare Other | Attending: Emergency Medicine | Admitting: Emergency Medicine

## 2023-03-30 DIAGNOSIS — J209 Acute bronchitis, unspecified: Secondary | ICD-10-CM

## 2023-03-30 LAB — SARS CORONAVIRUS 2 BY RT PCR: SARS Coronavirus 2 by RT PCR: NEGATIVE

## 2023-03-30 MED ORDER — PREDNISONE 20 MG PO TABS
40.0000 mg | ORAL_TABLET | Freq: Once | ORAL | Status: AC
  Administered 2023-03-30: 40 mg via ORAL
  Filled 2023-03-30: qty 2

## 2023-03-30 MED ORDER — IPRATROPIUM-ALBUTEROL 0.5-2.5 (3) MG/3ML IN SOLN
3.0000 mL | Freq: Once | RESPIRATORY_TRACT | Status: AC
  Administered 2023-03-30: 3 mL via RESPIRATORY_TRACT
  Filled 2023-03-30: qty 3

## 2023-03-30 MED ORDER — AZITHROMYCIN 250 MG PO TABS: 250.0000 mg | ORAL_TABLET | Freq: Every day | ORAL | 0 refills | Status: AC

## 2023-03-30 MED ORDER — PREDNISONE 20 MG PO TABS: 40.0000 mg | ORAL_TABLET | Freq: Every day | ORAL | 0 refills | Status: AC

## 2023-03-30 NOTE — Discharge Instructions (Signed)
Please take the course of steroid as prescribed.  You may use the albuterol inhaler as needed for shortness of breath and wheezing.  Please follow closely with your primary care doctor.  If you continue to have wheezing or shortness of breath they may consider referring you to a pulmonologist/lung specialist.  Return to the emergency department any new or suddenly worsening symptoms.

## 2023-03-30 NOTE — ED Notes (Signed)
Pt discharged to home, NAD noted at time of discharge 

## 2023-03-30 NOTE — ED Provider Notes (Signed)
Emergency Department Provider Note   I have reviewed the triage vital signs and the nursing notes.   HISTORY  Chief Complaint Cough   HPI Charlotte [name] is a 72 y.o. female with past history reviewed below presents to the emergency department with cough and shortness of breath.  Symptoms have been worsening over the past week.  She has had associated nasal congestion.  Has been using her albuterol MDI at home with no relief.  Notes a prior history of asthma although has not had issues with this in quite some time.  No chest pain. No sick contacts.    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 04/01/2017   Chest pain at rest 01/27/2016   Chronic diastolic heart failure 04/22/2016   Decreased estrogen level    Degeneration of lumbar intervertebral disc    Essential hypertension 01/27/2016   Exertional dyspnea 01/27/2016   GERD (gastroesophageal reflux disease)    HNP (herniated nucleus pulposus) with myelopathy, cervical 04/01/2017   Hypersomnolence 01/27/2016   Hypothyroidism 01/27/2016   Idiopathic scoliosis of lumbar spine    Impaired fasting glucose    Injury of superior glenoid labrum of shoulder joint 03/12/2018   Insomnia    Lumbar stenosis with neurogenic claudication 08/23/2021   Major depressive disorder    MCI (mild cognitive impairment) 03/05/2018   Mixed hyperlipidemia 01/27/2016   Murmur, cardiac 01/27/2016   Neuropathy    Non-toxic goiter    OSA (obstructive sleep apnea) 04/22/2016   nightly CPAP use   Osteoarthritis    Pain in joint of left shoulder 03/12/2018   Pericardial effusion 01/27/2016   PONV (postoperative nausea and vomiting)    Pre-diabetes    Restless legs    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 Respiratory: Positive shortness of breath. Positive cough.  Gastrointestinal: No abdominal pain.  No nausea. Musculoskeletal: Negative for back pain. Skin: Negative for rash. Neurological: Negative for headaches.  ____________________________________________   PHYSICAL EXAM:  VITAL SIGNS: ED Triage Vitals  Enc Vitals Group     BP 03/29/23 2057 (!) 145/67     Pulse Rate 03/29/23 2057 61     Resp 03/29/23 2057 20     Temp 03/29/23 2057 (!) 97.4 F (36.3 C)     Temp src --      SpO2 03/29/23 2057 96 %     Weight 03/29/23 2053 190 lb (86.2 kg)     Height 03/29/23 2053 5\' 7"  (1.702 m)   Constitutional: Alert and oriented. Well appearing and in no acute distress. Eyes: Conjunctivae are normal.  Head: Atraumatic. Nose: No congestion/rhinnorhea. Mouth/Throat: Mucous membranes are moist.   Neck: No stridor.   Cardiovascular: Normal rate, regular rhythm. Good peripheral circulation. Grossly normal heart sounds.   Respiratory: Normal respiratory effort.  No retractions. Lungs with bilateral end-expiratory wheezing.  Gastrointestinal: Soft and nontender. No distention.  Musculoskeletal: No lower extremity tenderness nor edema. No gross deformities of extremities. Neurologic:  Normal speech and language. No gross focal neurologic deficits are appreciated.  Skin:  Skin is warm, dry and intact. No rash noted.   ____________________________________________   LABS (all labs ordered are listed,  but only abnormal results are displayed)  Labs Reviewed  SARS CORONAVIRUS 2 BY RT PCR   ____________________________________________  RADIOLOGY  DG Chest 2 View  Result Date: 03/29/2023 CLINICAL DATA:  Cough EXAM: CHEST - 2 VIEW COMPARISON:  01/01/2016 FINDINGS: The heart size and mediastinal contours are within normal limits. Both lungs are clear. The visualized skeletal structures are unremarkable. Postsurgical  changes in the cervical spine and right shoulder are noted. IMPRESSION: No active cardiopulmonary disease. Electronically Signed   By: Alcide Clever M.D.   On: 03/29/2023 21:39    ____________________________________________   PROCEDURES  Procedure(s) performed:   Procedures  None  ____________________________________________   INITIAL IMPRESSION / ASSESSMENT AND PLAN / ED COURSE  Pertinent labs & imaging results that were available during my care of the patient were reviewed by me and considered in my medical decision making (see chart for details).   This patient is Presenting for Evaluation of SOB/cough, which does require a range of treatment options, and is a complaint that involves a high risk of morbidity and mortality.  The Differential Diagnoses include bronchitis, asthma, COVID, Flu, RVS, CAP, ACS, PE etc.  Critical Interventions-    Medications  ipratropium-albuterol (DUONEB) 0.5-2.5 (3) MG/3ML nebulizer solution 3 mL (3 mLs Nebulization Given 03/29/23 2059)  predniSONE (DELTASONE) tablet 40 mg (40 mg Oral Given 03/30/23 0056)  ipratropium-albuterol (DUONEB) 0.5-2.5 (3) MG/3ML nebulizer solution 3 mL (3 mLs Nebulization Given 03/30/23 0040)    Reassessment after intervention: symptoms improved.    Clinical Laboratory Tests Ordered, included COVID-negative.  Radiologic Tests Ordered, included CXR. I independently interpreted the images and agree with radiology interpretation.   Cardiac Monitor Tracing which shows NSR.   Social Determinants of Health Risk patient is a non-smoker.   Medical Decision Making: Summary:  Notes emergency department with wheezing and shortness of breath.  Patient received DuoNeb in triage and feeling somewhat better although after ambulation symptoms worsening.  She does not appear to be acutely volume overloaded.  Lower suspicion for community-acquired pneumonia.  Chest x-ray without acute process.  Plan to continue nebs and steroid.  She  does have a history of asthma and seems most consistent with that process.  Reevaluation with update and discussion with patient.  She has been much better after additional nebulizer and steroid medication.  Plan to continue management of asthma flare/bronchitis at home.   Patient's presentation is most consistent with acute presentation with potential threat to life or bodily function.   Disposition: discharge  ____________________________________________  FINAL CLINICAL IMPRESSION(S) / ED DIAGNOSES  Final diagnoses:  Acute bronchitis, unspecified organism     NEW OUTPATIENT MEDICATIONS STARTED DURING THIS VISIT:  Discharge Medication List as of 03/30/2023  1:49 AM     START taking these medications   Details  azithromycin (ZITHROMAX) 250 MG tablet Take 1 tablet (250 mg total) by mouth daily. Take first 2 tablets together, then 1 every day until finished., Starting Sun 03/30/2023, Normal    predniSONE (DELTASONE) 20 MG tablet Take 2 tablets (40 mg total) by mouth daily for 4 days., Starting Sun 03/30/2023, Until Thu 04/03/2023, Normal        Note:  This document was prepared using Dragon voice recognition software and may include unintentional dictation errors.  Alona Bene, MD, [facility] Emergency Medicine    Anjalina Bergevin, Arlyss Repress, MD 03/30/23 [phone]

## 2023-03-31 DIAGNOSIS — J209 Acute bronchitis, unspecified: Secondary | ICD-10-CM | POA: Diagnosis not present

## 2023-04-01 DIAGNOSIS — M48061 Spinal stenosis, lumbar region without neurogenic claudication: Secondary | ICD-10-CM | POA: Diagnosis not present

## 2023-04-01 DIAGNOSIS — J45901 Unspecified asthma with (acute) exacerbation: Secondary | ICD-10-CM | POA: Diagnosis not present

## 2023-04-01 DIAGNOSIS — Z0000 Encounter for general adult medical examination without abnormal findings: Secondary | ICD-10-CM | POA: Diagnosis not present

## 2023-04-02 DIAGNOSIS — J45909 Unspecified asthma, uncomplicated: Secondary | ICD-10-CM | POA: Diagnosis not present

## 2023-04-15 ENCOUNTER — Other Ambulatory Visit: Payer: Self-pay | Admitting: Cardiovascular Disease

## 2023-04-15 ENCOUNTER — Ambulatory Visit
Admission: RE | Admit: 2023-04-15 | Discharge: 2023-04-15 | Disposition: A | Discharge status: Home / Self Care | Payer: Medicare Other | Source: Ambulatory Visit | Attending: Family Medicine | Admitting: Family Medicine

## 2023-04-15 DIAGNOSIS — Z1231 Encounter for screening mammogram for malignant neoplasm of breast: Secondary | ICD-10-CM | POA: Diagnosis not present

## 2023-04-15 DIAGNOSIS — Z0000 Encounter for general adult medical examination without abnormal findings: Secondary | ICD-10-CM

## 2023-04-16 ENCOUNTER — Other Ambulatory Visit: Payer: Self-pay

## 2023-04-16 ENCOUNTER — Other Ambulatory Visit: Payer: Self-pay | Admitting: Cardiovascular Disease

## 2023-04-16 DIAGNOSIS — E782 Mixed hyperlipidemia: Secondary | ICD-10-CM | POA: Diagnosis not present

## 2023-04-16 DIAGNOSIS — I5030 Unspecified diastolic (congestive) heart failure: Secondary | ICD-10-CM | POA: Diagnosis not present

## 2023-04-16 DIAGNOSIS — I110 Hypertensive heart disease with heart failure: Secondary | ICD-10-CM | POA: Diagnosis not present

## 2023-04-16 DIAGNOSIS — E039 Hypothyroidism, unspecified: Secondary | ICD-10-CM | POA: Diagnosis not present

## 2023-04-16 DIAGNOSIS — M48061 Spinal stenosis, lumbar region without neurogenic claudication: Secondary | ICD-10-CM | POA: Diagnosis not present

## 2023-04-16 DIAGNOSIS — R7301 Impaired fasting glucose: Secondary | ICD-10-CM | POA: Diagnosis not present

## 2023-04-22 DIAGNOSIS — G4733 Obstructive sleep apnea (adult) (pediatric): Secondary | ICD-10-CM | POA: Diagnosis not present

## 2023-04-23 DIAGNOSIS — H40011 Open angle with borderline findings, low risk, right eye: Secondary | ICD-10-CM | POA: Diagnosis not present

## 2023-05-03 DIAGNOSIS — J45909 Unspecified asthma, uncomplicated: Secondary | ICD-10-CM | POA: Diagnosis not present

## 2023-05-14 ENCOUNTER — Encounter: Payer: Self-pay | Admitting: Cardiovascular Disease

## 2023-05-16 DIAGNOSIS — J45909 Unspecified asthma, uncomplicated: Secondary | ICD-10-CM | POA: Diagnosis not present

## 2023-05-19 DIAGNOSIS — J45909 Unspecified asthma, uncomplicated: Secondary | ICD-10-CM | POA: Diagnosis not present

## 2023-05-21 ENCOUNTER — Ambulatory Visit: Payer: Medicare Other | Admitting: Physician Assistant

## 2023-05-21 ENCOUNTER — Encounter: Payer: Self-pay | Admitting: Physician Assistant

## 2023-05-21 VITALS — BP 131/52 | HR 60 | Resp 20 | Ht 66.0 in | Wt 194.0 lb

## 2023-05-21 DIAGNOSIS — G3184 Mild cognitive impairment, so stated: Secondary | ICD-10-CM | POA: Diagnosis not present

## 2023-05-21 NOTE — Patient Instructions (Signed)
It was a pleasure to see you today at our office.   Recommendations:  Follow up in 6  months Continue donepezil 10 mg daily.   Repeat the neuropsychological evaluation  Continue CPAP  Whom to call:  Memory  decline, memory medications: Call our office [phone]   For psychiatric meds, mood meds: Please have your primary care physician manage these medications.  If you have any severe symptoms of a stroke, or other severe issues such as confusion,severe chills or fever, etc call 911 or go to the ER as you may need to be evaluated further  You have been referred for a neuropsychological evaluation (i.e., evaluation of memory and thinking abilities). Please bring someone with you to this appointment if possible, as it is helpful for the doctor to hear from both you and another adult who knows you well. Please bring eyeglasses and hearing aids if you wear them.    The evaluation will take approximately 3 hours and has two parts:   The first part is a clinical interview with the neuropsychologist (Dr. Milbert Coulter or Dr. Roseanne Reno). During the interview, the neuropsychologist will speak with you and the individual you brought to the appointment.    The second part of the evaluation is testing with the doctor's technician Annabelle Harman or Selena Batten). During the testing, the technician will ask you to remember different types of material, solve problems, and answer some questionnaires. Your family member will not be present for this portion of the evaluation.   Please note: We must reserve several hours of the neuropsychologist's time and the psychometrician's time for your evaluation appointment. As such, there is a No-Show fee of $100. If you are unable to attend any of your appointments, please contact our office as soon as possible to reschedule.    RECOMMENDATIONS FOR ALL PATIENTS WITH MEMORY PROBLEMS: 1. Continue to exercise (Recommend 30 minutes of walking everyday, or 3 hours every week) 2. Increase social  interactions - continue going to [place] and enjoy social gatherings with friends and family 3. Eat healthy, avoid fried foods and eat more fruits and vegetables 4. Maintain adequate blood pressure, blood sugar, and blood cholesterol level. Reducing the risk of stroke and cardiovascular disease also helps promoting better memory. 5. Avoid stressful situations. Live a simple life and avoid aggravations. Organize your time and prepare for the next day in anticipation. 6. Sleep well, avoid any interruptions of sleep and avoid any distractions in the bedroom that may interfere with adequate sleep quality 7. Avoid sugar, avoid sweets as there is a strong link between excessive sugar intake, diabetes, and cognitive impairment We discussed the Mediterranean diet, which has been shown to help patients reduce the risk of progressive memory disorders and reduces cardiovascular risk. This includes eating fish, eat fruits and green leafy vegetables, nuts like almonds and hazelnuts, walnuts, and also use olive oil. Avoid fast foods and fried foods as much as possible. Avoid sweets and sugar as sugar use has been linked to worsening of memory function.  There is always a concern of gradual progression of memory problems. If this is the case, then we may need to adjust level of care according to patient needs. Support, both to the patient and caregiver, should then be put into place.    The Alzheimer[employer] Association is here all day, every day for people facing Alzheimer's disease through our free 24/7 Helpline: [phone])423-0632. The Helpline provides reliable information and support to all those who need assistance, such as individuals living with  memory loss, Alzheimer's or other dementia, caregivers, health care professionals and the public.  Our highly trained and knowledgeable staff can help you with: Understanding memory loss, dementia and Alzheimer's  Medications and other treatment options  General information about  aging and brain health  Skills to provide quality care and to find the best care from professionals  Legal, financial and living-arrangement decisions Our Helpline also features: Confidential care consultation provided by master's level clinicians who can help with decision-making support, crisis assistance and education on issues families face every day  Help in a caller's preferred language using our translation service that features more than 200 languages and dialects  Referrals to local community programs, services and ongoing support     FALL PRECAUTIONS: Be cautious when walking. Scan the area for obstacles that may increase the risk of trips and falls. When getting up in the mornings, sit up at the edge of the bed for a few minutes before getting out of bed. Consider elevating the bed at the head end to avoid drop of blood pressure when getting up. Walk always in a well-lit room (use night lights in the walls). Avoid area rugs or power cords from appliances in the middle of the walkways. Use a walker or a cane if necessary and consider physical therapy for balance exercise. Get your eyesight checked regularly.  FINANCIAL OVERSIGHT: Supervision, especially oversight when making financial decisions or transactions is also recommended.  HOME SAFETY: Consider the safety of the kitchen when operating appliances like stoves, microwave oven, and blender. Consider having supervision and share cooking responsibilities until no longer able to participate in those. Accidents with firearms and other hazards in the house should be identified and addressed as well.   ABILITY TO BE LEFT ALONE: If patient is unable to contact 911 operator, consider using LifeLine, or when the need is there, arrange for someone to stay with patients. Smoking is a fire hazard, consider supervision or cessation. Risk of wandering should be assessed by caregiver and if detected at any point, supervision and safe proof  recommendations should be instituted.  MEDICATION SUPERVISION: Inability to self-administer medication needs to be constantly addressed. Implement a mechanism to ensure safe administration of the medications.   DRIVING: Regarding driving, in patients with progressive memory problems, driving will be impaired. We advise to have someone else do the driving if trouble finding directions or if minor accidents are reported. Independent driving assessment is available to determine safety of driving.   If you are interested in the driving assessment, you can contact the following:  [employer] in [place] [phone]  [facility] [phone]  [facility] [phone] or [phone]      Mediterranean Diet A Mediterranean diet refers to food and lifestyle choices that are based on the traditions of countries located on the [employer]. This way of eating has been shown to help prevent certain conditions and improve outcomes for people who have chronic diseases, like kidney disease and heart disease. What are tips for following this plan? Lifestyle  Cook and eat meals together with your family, when possible. Drink enough fluid to keep your urine clear or pale yellow. Be physically active every day. This includes: Aerobic exercise like running or swimming. Leisure activities like gardening, walking, or housework. Get 7-8 hours of sleep each night. If recommended by your health care provider, drink red wine in moderation. This means 1 glass a day for nonpregnant women and 2  glasses a day for men. A glass of wine equals 5 oz (150 mL). Reading food labels  Check the serving size of packaged foods. For foods such as rice and pasta, the serving size refers to the amount of cooked product, not dry. Check the total fat in packaged foods. Avoid foods that have saturated fat or trans fats. Check the ingredients list  for added sugars, such as corn syrup. Shopping  At the grocery store, buy most of your food from the areas near the walls of the store. This includes: Fresh fruits and vegetables (produce). Grains, beans, nuts, and seeds. Some of these may be available in unpackaged forms or large amounts (in bulk). Fresh seafood. Poultry and eggs. Low-fat dairy products. Buy whole ingredients instead of prepackaged foods. Buy fresh fruits and vegetables in-season from local farmers markets. Buy frozen fruits and vegetables in resealable bags. If you do not have access to quality fresh seafood, buy precooked frozen shrimp or canned fish, such as tuna, salmon, or sardines. Buy small amounts of raw or cooked vegetables, salads, or olives from the deli or salad bar at your store. Stock your pantry so you always have certain foods on hand, such as olive oil, canned tuna, canned tomatoes, rice, pasta, and beans. Cooking  Cook foods with extra-virgin olive oil instead of using butter or other vegetable oils. Have meat as a side dish, and have vegetables or grains as your main dish. This means having meat in small portions or adding small amounts of meat to foods like pasta or stew. Use beans or vegetables instead of meat in common dishes like chili or lasagna. Experiment with different cooking methods. Try roasting or broiling vegetables instead of steaming or sauteing them. Add frozen vegetables to soups, stews, pasta, or rice. Add nuts or seeds for added healthy fat at each meal. You can add these to yogurt, salads, or vegetable dishes. Marinate fish or vegetables using olive oil, lemon juice, garlic, and fresh herbs. Meal planning  Plan to eat 1 vegetarian meal one day each week. Try to work up to 2 vegetarian meals, if possible. Eat seafood 2 or more times a week. Have healthy snacks readily available, such as: Vegetable sticks with hummus. Greek yogurt. Fruit and nut trail mix. Eat balanced meals  throughout the week. This includes: Fruit: 2-3 servings a day Vegetables: 4-5 servings a day Low-fat dairy: 2 servings a day Fish, poultry, or lean meat: 1 serving a day Beans and legumes: 2 or more servings a week Nuts and seeds: 1-2 servings a day Whole grains: 6-8 servings a day Extra-virgin olive oil: 3-4 servings a day Limit red meat and sweets to only a few servings a month What are my food choices? Mediterranean diet Recommended Grains: Whole-grain pasta. Brown rice. Bulgar wheat. Polenta. Couscous. Whole-wheat bread. Orpah Cobb. Vegetables: Artichokes. Beets. Broccoli. Cabbage. Carrots. Eggplant. Green beans. Chard. Kale. Spinach. Onions. Leeks. Peas. Squash. Tomatoes. Peppers. Radishes. Fruits: Apples. Apricots. Avocado. Berries. Bananas. Cherries. Dates. Figs. Grapes. Lemons. Melon. Oranges. Peaches. Plums. Pomegranate. Meats and other protein foods: Beans. Almonds. Sunflower seeds. Pine nuts. Peanuts. Cod. Salmon. Scallops. Shrimp. Tuna. Tilapia. Clams. Oysters. Eggs. Dairy: Low-fat milk. Cheese. Greek yogurt. Beverages: Water. Red wine. Herbal tea. Fats and oils: Extra virgin olive oil. Avocado oil. Grape seed oil. Sweets and desserts: [place] yogurt with honey. Baked apples. Poached pears. Trail mix. Seasoning and other foods: Basil. Cilantro. Coriander. Cumin. Mint. Parsley. Sage. Rosemary. Tarragon. Garlic. Oregano. Thyme. Pepper. Balsalmic vinegar. Tahini. Hummus. Tomato  sauce. Olives. Mushrooms. Limit these Grains: Prepackaged pasta or rice dishes. Prepackaged cereal with added sugar. Vegetables: Deep fried potatoes (french fries). Fruits: Fruit canned in syrup. Meats and other protein foods: Beef. Pork. Lamb. Poultry with skin. Hot dogs. Tomasa Blase. Dairy: Ice cream. Sour cream. Whole milk. Beverages: Juice. Sugar-sweetened soft drinks. Beer. Liquor and spirits. Fats and oils: Butter. Canola oil. Vegetable oil. Beef fat (tallow). Lard. Sweets and desserts: Cookies.  Cakes. Pies. Candy. Seasoning and other foods: Mayonnaise. Premade sauces and marinades. The items listed may not be a complete list. Talk with your dietitian about what dietary choices are right for you. Summary The Mediterranean diet includes both food and lifestyle choices. Eat a variety of fresh fruits and vegetables, beans, nuts, seeds, and whole grains. Limit the amount of red meat and sweets that you eat. Talk with your health care provider about whether it is safe for you to drink red wine in moderation. This means 1 glass a day for nonpregnant women and 2 glasses a day for men. A glass of wine equals 5 oz (150 mL). This information is not intended to replace advice given to you by your health care provider. Make sure you discuss any questions you have with your health care provider. Document Released: 06/27/2016 Document Revised: 07/30/2016 Document Reviewed: 06/27/2016 Elsevier Interactive Patient Education  2017 [employer].

## 2023-05-21 NOTE — Progress Notes (Signed)
Assessment/Plan:   Mild Cognitive Impairment of unclear etiology.   Charlotte [name] is a very pleasant 72 y.o. RH female with a history of hypertension, hyperlipidemia,  hypothyroidism, OSA on CPAP, insomnia, dCHF, anxiety, depression and a history of Mild Cognitive Impairment of unclear etiology per Neuropsychological evaluation 2022   presenting today in follow-up for evaluation of memory loss. Patient is on donepezil 10 mg daily. MMSE today is 29/30. Memory is stable. Patient is able to participate on his IADLs. Continues to drive        Recommendations:   Follow up in 6  months. Continue donepezil 10 mg daily. Side effects were discussed  Patient is scheduled on 07/2023 for repeat Neuropsych testing for clarity of diagnosis and disease trajectory   Continue using CPAP for OSA  Recommend good control of cardiovascular risk factors Continue to control mood as per PCP    Subjective:   This patient is here alone.  Previous records as well as any outside records available were reviewed prior to todays visit.   Patient was last seen on 11/20/2022 with an MMSE of 30/30.     Any changes in memory since last visit? " No changes" Able to remember recent conversations and people names. Enjoys going to [employer], meeting friends, reading. Does not enjoy doing  do crossword puzzles or word finding, but likes candy crush and jigsaw puzzles. She also likes to do her yard and play with her grand-kids.  repeats oneself?  Denies Disoriented when walking into a room?  Patient denies   Leaving objects in unusual places?  Patient denies   Wandering behavior?   denies   Any personality changes since last visit?   denies   Any worsening depression?: denies  Hallucinations or paranoia?  denies   Seizures?   denies    Any sleep changes? Sleeps well  Denies vivid dreams, REM behavior or sleepwalking   Sleep apnea?  Endorsed, uses CPAP for the last 6 years   Any hygiene concerns?   denies    Independent of bathing and dressing?  Endorsed  Does the patient needs help with medications? Patient is in charge   Who is in charge of the finances?  Patient is in charge     Any changes in appetite?  denies     Patient have trouble swallowing?  denies   Does the patient cook?  Yes  Any kitchen accidents such as leaving the stove on?   denies   Any headaches?    denies   Vision changes? denies Chronic pain? She has lumbar stenosis with chronic back pain, she has to see her Neurosurgeon and pain management next month.     Ambulates with difficulty?    denies    Recent falls or head injuries?    denies      Unilateral weakness, numbness or tingling?   denies   Any tremors? Occasionally, not often, not worse, only more noticeable when holding a heavy object.   Any anosmia?    denies   Any incontinence of urine?  denies   Any bowel dysfunction?  denies      Patient lives alone   Does the patient drive?yes, denies getting lost       Initial Visit 06/13/21  The patient is seen in neurologic consultation at the request of Charlotte Raider, MD for the evaluation of memory.  The patient is here alone.  She is a 72 year old woman who had memory issues for about 3  years.  In 2/ 2019, the patient was seen by neuropsychology, Dr. Alinda Dooms and after extensive work-up, she was diagnosed with mild dementia, rule out Alzheimer's disease.  She was placed on Aricept 10 mg daily, and she is tolerating it well.  In addition, other issues contributed to her memory loss were mild major depressive disorder, and moderate generalized anxiety disorder. At the time, she was forgetting appointments, including jury duty twice in the Fall of 2018 even after being reminded on the same day.  She also was forgetting details of recent conversations, forgetting recent events, repeating questions and statements, misplacing and losing things, word finding difficulty, and occasional difficulty following a conversation or  comprehending what is being told to her.  She was not appearing to have any significant difficulty with attention and concentration, or visual spatial navigation. Psychiatry was recommended but patient declined follow up. During a visit to her PCP on 06/04/2021, she reported being "a little more forgetful ".  She was referred for further evaluation.  Although "Aricept helps, it feels that it is taking me a little longer to get the words out ".  She gets to her room, and does not remember what she was coming there for.  The patient lives alone, but her sister lives next door, and monitors any changes.  She has been dealing with some depression, but this is not worse than prior.  She denies irritability.  Her sleep is poor because she has to "get up a lot at night to go to the bathroom and wear a CPAP so uncomfortable".She does report vivid dreams, she calls them "crazy dreams ", and at times she acts them out.  She denies sleepwalking, although her dead husband used to tell her that she used to sleepwalk at times.  Denies hallucinations or paranoia.  She denies any issues with bathing and dressing.  Occasionally she forgets to take her medication.  She takes care of her own finances, without forgetting to pay any bills.  She denies leaving objects in unusual places.  Her appetite is good, denies trouble swallowing, or sialorrhea.  She cooks, and denies leaving the stove or the faucet on, or forgetting common recipes.  She ambulates without difficulty without a walker or a cane.  She denies any falls.  She has a history of right leg neuropathy, followed by Dr. Vear Clock, otherwise no focal numbness or tingling, unilateral weakness, but she does have mild tremors, which she reports having them for many years, and they have not gotten worse.  She does not yet drop any objects or have any trouble writing or utilizing utensils.  She continues to drive without getting lost, she uses frequently the GPS.  She denies any  headaches, or falls.  She denies any injuries to the head, double vision or dizziness.  Denies urine incontinence during the day.  She denies constipation or diarrhea.  Denies anosmia.  Denies any history of alcohol or tobacco.  Family history significant for dementia in maternal grandmother and 1 maternal aunt.  She is retired from the OR at [employer] since 2013.  She is widowed since 2010.  She has 3 daughters and 5 grandchildren.   Neuropsych evaluation Dr. Milbert Coulter 10/19/21  "Results suggested severe impairment surrounding all aspects of verbal memory, as well as verbal fluency (phonemic somewhat worse than semantic). Performance variability but generally below expectation performances were also exhibited across the domain of executive functioning. Relative to her previous evaluation in 2019, nearly all cognitive performances were  stable. Subtle decline was seen across one memory task (list learning), as well as aspects of both phonemic and semantic fluency. However, outside of this, no clinically significant decline was captured between her previous evaluation and current test performances. The etiology for ongoing impairment is unclear. Recent neuroimaging did not reveal any significant vascular risk factors and Ms. Tracy does not demonstrate strong behavioral features of Lewy body disease or frontotemporal lobar degeneration, making vascular dementia, Lewy body dementia, and frontotemporal dementia unlikely. Given significant impairment across verbal memory, continued considerations surrounding Alzheimer's disease as the underlying cause are certainly warranted, especially in the absence of a viable alternative. With that being said, it is also worth highlighting that Ms. Osowski's profile is not consistent with typically presenting Alzheimer's disease.      Labs 06/04/21:  A1C 6.5,  Lipid panel normal, TSH 2.29 nl,  CMP normal    MRI brain 03/21/18 w and wo contrast  1. Normal MRI appearance of the  brain for age. 2. 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   Brain MRI on 06/28/2021 revealed mild generalized cerebral atrophy and minimal small vessel ischemic changes.    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 04/01/2017   Chest pain at rest 01/27/2016   Chronic diastolic heart failure 04/22/2016   Decreased estrogen level    Degeneration of lumbar intervertebral disc    Essential hypertension 01/27/2016   Exertional dyspnea 01/27/2016   GERD (gastroesophageal reflux disease)    HNP (herniated nucleus pulposus) with myelopathy, cervical 04/01/2017   Hypersomnolence 01/27/2016   Hypothyroidism 01/27/2016   Idiopathic scoliosis of lumbar spine    Impaired fasting glucose    Injury of superior glenoid labrum of shoulder joint 03/12/2018   Insomnia    Lumbar stenosis with neurogenic claudication 08/23/2021   Major depressive disorder    MCI (mild cognitive impairment) 03/05/2018   Mixed hyperlipidemia 01/27/2016   Murmur, cardiac 01/27/2016   Neuropathy    Non-toxic goiter    OSA (obstructive sleep apnea) 04/22/2016   nightly CPAP use   Osteoarthritis    Pain in joint of left shoulder 03/12/2018   Pericardial effusion 01/27/2016   PONV (postoperative nausea and vomiting)    Pre-diabetes    Restless legs    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   Shortness of breath dyspnea    relating to asthma   Syncope 01/27/2016     Past Surgical History:  Procedure Laterality Date   ABDOMINAL HYSTERECTOMY     BACK SURGERY  08/23/2021   BREAST REDUCTION SURGERY  2011   CERVICAL DISC ARTHROPLASTY N/A 04/01/2017   Procedure: CERVICAL ANTERIOR DISC ARTHROPLASTY C5-C6;  Surgeon: Barnett Abu, MD;  Location: [facility] OR;  Service: Neurosurgery;  Laterality: N/A;   COLONOSCOPY     ESOPHAGOGASTRODUODENOSCOPY     EYE SURGERY      bilateral cataracts   JOINT REPLACEMENT     right shoulder replacement   REDUCTION MAMMAPLASTY     REVERSE SHOULDER ARTHROPLASTY Right 08/02/2016   Procedure: REVERSE SHOULDER ARTHROPLASTY;  Surgeon: Beverely Low, MD;  Location: [facility] OR;  Service: Orthopedics;  Laterality: Right;     PREVIOUS MEDICATIONS:   CURRENT MEDICATIONS:  Outpatient Encounter Medications as of 05/21/2023  Medication Sig   albuterol (PROVENTIL HFA;VENTOLIN HFA) 108 (90 Base) MCG/ACT inhaler Inhale 2  puffs into the lungs every 6 (six) hours as needed for wheezing or shortness of breath.  (Patient not taking: Reported on 04/05/2022)   amLODipine (NORVASC) 5 MG tablet TAKE 1 TABLET BY MOUTH DAILY   aspirin 81 MG tablet Take 81 mg by mouth daily.   azithromycin (ZITHROMAX) 250 MG tablet Take 1 tablet (250 mg total) by mouth daily. Take first 2 tablets together, then 1 every day until finished.   donepezil (ARICEPT) 10 MG tablet Take 10 mg by mouth at bedtime.   furosemide (LASIX) 20 MG tablet Take 1 tablet (20 mg total) by mouth daily. NEED OV.   levothyroxine (SYNTHROID, LEVOTHROID) 50 MCG tablet Take 50 mcg by mouth daily.    lisinopril-hydrochlorothiazide (ZESTORETIC) 20-25 MG tablet Take 1 tablet by mouth daily.   methocarbamol (ROBAXIN) 500 MG tablet Take 1 tablet (500 mg total) by mouth every 6 (six) hours as needed for muscle spasms. (Patient not taking: Reported on 05/14/2022)   rOPINIRole (REQUIP) 0.5 MG tablet Take 0.5 mg by mouth at bedtime. (Patient not taking: Reported on 04/05/2022)   rOPINIRole (REQUIP) 1 MG tablet Take 1 mg by mouth 2 (two) times daily.   rosuvastatin (CRESTOR) 20 MG tablet TAKE 1 TABLET BY MOUTH DAILY   sertraline (ZOLOFT) 100 MG tablet Take 100 mg by mouth daily.   TRELEGY ELLIPTA 100-62.5-25 MCG/INH AEPB Inhale 1 puff into the lungs every other day. As needed   vitamin B-12 (CYANOCOBALAMIN) 1000 MCG tablet Take 1,000 mcg by mouth daily.   No facility-administered encounter medications on  file as of 05/21/2023.     Objective:     PHYSICAL EXAMINATION:    VITALS:  There were no vitals filed for this visit.  GEN:  The patient appears stated age and is in NAD. HEENT:  Normocephalic, atraumatic.   Neurological examination:  General: NAD, well-groomed, appears stated age. Orientation: The patient is alert. Oriented to person, place and date Cranial nerves: There is good facial symmetry.The speech is fluent and clear. No aphasia or dysarthria. Fund of knowledge is appropriate. Recent memory impaired and remote memory is normal.  Attention and concentration are normal.  Able to name objects and repeat phrases.  Hearing is intact to conversational tone  Delayed recall 3/3 Sensation: Sensation is intact to light touch throughout Motor: Strength is at least antigravity x4. DTR's 2/4 in UE/LE      06/13/2021    8:00 AM  Montreal Cognitive Assessment   Visuospatial/ Executive (0/5) 3  Naming (0/3) 3  Attention: Read list of digits (0/2) 2  Attention: Read list of letters (0/1) 1  Attention: Serial 7 subtraction starting at 100 (0/3) 0  Language: Repeat phrase (0/2) 1  Language : Fluency (0/1) 0  Abstraction (0/2) 1  Delayed Recall (0/5) 0  Orientation (0/6) 6  Total 17  Adjusted Score (based on education) 17       11/20/2022   10:00 AM  MMSE - Mini Mental State Exam  Orientation to time 5  Orientation to Place 5  Registration 3  Attention/ Calculation 5  Recall 3  Language- name 2 objects 2  Language- repeat 1  Language- follow 3 step command 3  Language- read & follow direction 1  Write a sentence 1  Copy design 1  Total score 30       Movement examination: Tone: There is normal tone in the UE/LE Abnormal movements:  no tremor.  No myoclonus.  No asterixis.   Coordination:  There is  no decremation with RAM's. Normal finger to nose  Gait and Station: The patient has no difficulty arising out of a deep-seated chair without the use of the hands. The  patient's stride length is good.  Gait is cautious and narrow.   Thank you for allowing [place] the opportunity to participate in the care of this nice patient. Please do not hesitate to contact us for any questions or concerns.   Total time spent on today's visit was 25 minutes dedicated to this patient today, preparing to see patient, examining the patient, ordering tests and/or medications and counseling the patient, documenting clinical information in the EHR or other health record, independently interpreting results and communicating results to the patient/family, discussing treatment and goals, answering patient's questions and coordinating care.  Cc:  Charlotte Raider, MD  Marlowe Kays 05/21/2023 6:25 AM

## 2023-05-30 ENCOUNTER — Other Ambulatory Visit: Payer: Self-pay | Admitting: Cardiovascular Disease

## 2023-06-02 DIAGNOSIS — J45909 Unspecified asthma, uncomplicated: Secondary | ICD-10-CM | POA: Diagnosis not present

## 2023-07-02 ENCOUNTER — Encounter: Payer: Medicare Other | Attending: Family Medicine | Admitting: Skilled Nursing Facility1

## 2023-07-02 ENCOUNTER — Encounter: Payer: Self-pay | Admitting: Skilled Nursing Facility1

## 2023-07-02 DIAGNOSIS — E119 Type 2 diabetes mellitus without complications: Secondary | ICD-10-CM | POA: Diagnosis not present

## 2023-07-02 NOTE — Progress Notes (Signed)
Diabetes Self-Management Education  Visit Type: First/Initial  Appt. Start Time: 10:55 Appt. End Time: 11:40  07/02/2023  [name], identified by name and date of birth, is a 72 y.o. female with a diagnosis of Diabetes: Type 2.   ASSESSMENT  There were no vitals taken for this visit. There is no height or weight on file to calculate BMI.  Other Dx: Depression GERD Heart mumur Hyperlipidemia  HTN Athritis Asthma Thyroid    Pt states she checks her blood sugars about 1-2 times a day. Pt states she has back pain from scoliosis so she cannot be as active as she likes but generally stays up moving.    Diabetes Self-Management Education - 07/02/23 1105       Visit Information   Visit Type First/Initial      Initial Visit   Diabetes Type Type 2    Are you currently following a meal plan? No    Are you taking your medications as prescribed? No      Health Coping   How would you rate your overall health? Good      Psychosocial Assessment   Patient Belief/Attitude about Diabetes Motivated to manage diabetes    What is the hardest part about your diabetes right now, causing you the most concern, or is the most worrisome to you about your diabetes?   Making healty food and beverage choices    Self-care barriers None    Self-management support Family;Friends    Patient Concerns Nutrition/Meal planning    Special Needs None    Preferred Learning Style Auditory    Learning Readiness Change in progress      Pre-Education Assessment   Patient understands the diabetes disease and treatment process. Needs Instruction    Patient understands incorporating nutritional management into lifestyle. Needs Instruction    Patient undertands incorporating physical activity into lifestyle. Needs Instruction    Patient understands using medications safely. Needs Instruction    Patient understands monitoring blood glucose, interpreting and using results Needs Instruction    Patient  understands prevention, detection, and treatment of acute complications. Needs Instruction    Patient understands prevention, detection, and treatment of chronic complications. Needs Instruction    Patient understands how to develop strategies to address psychosocial issues. Needs Instruction    Patient understands how to develop strategies to promote health/change behavior. Needs Instruction      Complications   Last HgB A1C per patient/outside source 6.7 %    How often do you check your blood sugar? 1-2 times/day    Fasting Blood glucose range (mg/dL) 40-981    Postprandial Blood glucose range (mg/dL) 19-147    Number of hypoglycemic episodes per month 0    Number of hyperglycemic episodes ( >200mg /dL): Never    Can you tell when your blood sugar is high? Yes    Have you had a dilated eye exam in the past 12 months? Yes    Have you had a dental exam in the past 12 months? Yes    Are you checking your feet? Yes    How many days per week are you checking your feet? 7      Dietary Intake   Breakfast protein shake    Snack (morning) protein bar    Lunch sandwich and fruit    Dinner sapgetti and toast    Beverage(s) water, soda      Activity / Exercise   Activity / Exercise Type ADL's  Patient Education   Previous Diabetes Education No    Disease Pathophysiology Factors that contribute to the development of diabetes    Healthy Eating Food label reading, portion sizes and measuring food.;Plate Method;Meal options for control of blood glucose level and chronic complications.    Being Active Role of exercise on diabetes management, blood pressure control and cardiac health.;Helped patient identify appropriate exercises in relation to his/her diabetes, diabetes complications and other health issue.    Monitoring Yearly dilated eye exam;Daily foot exams;Identified appropriate SMBG and/or A1C goals.;Taught/evaluated SMBG meter.    Acute complications Taught prevention, symptoms, and   treatment of hypoglycemia - the 15 rule.;Discussed and identified patients' prevention, symptoms, and treatment of hyperglycemia.    Chronic complications Dental care;Retinopathy and reason for yearly dilated eye exams;Nephropathy, what it is, prevention of, the use of ACE, ARB's and early detection of through urine microalbumia.;Lipid levels, blood glucose control and heart disease    Diabetes Stress and Support Role of stress on diabetes      Individualized Goals (developed by patient)   Nutrition Follow meal plan discussed;General guidelines for healthy choices and portions discussed    Physical Activity 30 minutes per day;45 minutes per day;Exercise 5-7 days per week    Monitoring  Test blood glucose pre and post meals as discussed    Problem Solving Eating Pattern      Post-Education Assessment   Patient understands the diabetes disease and treatment process. Demonstrates understanding / competency    Patient understands incorporating nutritional management into lifestyle. Demonstrates understanding / competency    Patient undertands incorporating physical activity into lifestyle. Demonstrates understanding / competency    Patient understands using medications safely. Demonstrates understanding / competency    Patient understands monitoring blood glucose, interpreting and using results Demonstrates understanding / competency    Patient understands prevention, detection, and treatment of acute complications. Demonstrates understanding / competency    Patient understands prevention, detection, and treatment of chronic complications. Demonstrates understanding / competency    Patient understands how to develop strategies to address psychosocial issues. Demonstrates understanding / competency    Patient understands how to develop strategies to promote health/change behavior. Demonstrates understanding / competency      Outcomes   Expected Outcomes Demonstrated interest in learning. Expect  positive outcomes    Future DMSE PRN    Program Status Completed             Individualized Plan for Diabetes Self-Management Training:   Learning Objective:  Patient will have a greater understanding of diabetes self-management. Patient education plan is to attend individual and/or group sessions per assessed needs and concerns.    Expected Outcomes:  Demonstrated interest in learning. Expect positive outcomes  Education material provided: ADA - How to Thrive: A Guide for Your Journey with Diabetes, Food label handouts, My Plate, Snack sheet, and Support group flyer  If problems or questions, patient to contact team via:  Phone and Email  Future DSME appointment: PRN

## 2023-07-03 DIAGNOSIS — J45909 Unspecified asthma, uncomplicated: Secondary | ICD-10-CM | POA: Diagnosis not present

## 2023-07-23 DIAGNOSIS — M5416 Radiculopathy, lumbar region: Secondary | ICD-10-CM | POA: Diagnosis not present

## 2023-08-03 DIAGNOSIS — J45909 Unspecified asthma, uncomplicated: Secondary | ICD-10-CM | POA: Diagnosis not present

## 2023-08-05 ENCOUNTER — Encounter: Payer: Medicare Other | Admitting: Psychology

## 2023-08-07 DIAGNOSIS — M5116 Intervertebral disc disorders with radiculopathy, lumbar region: Secondary | ICD-10-CM | POA: Diagnosis not present

## 2023-08-07 DIAGNOSIS — M5416 Radiculopathy, lumbar region: Secondary | ICD-10-CM | POA: Diagnosis not present

## 2023-08-12 DIAGNOSIS — J4541 Moderate persistent asthma with (acute) exacerbation: Secondary | ICD-10-CM | POA: Diagnosis not present

## 2023-08-13 ENCOUNTER — Encounter: Payer: Medicare Other | Admitting: Psychology

## 2023-08-22 DIAGNOSIS — I110 Hypertensive heart disease with heart failure: Secondary | ICD-10-CM | POA: Diagnosis not present

## 2023-08-22 DIAGNOSIS — Z9989 Dependence on other enabling machines and devices: Secondary | ICD-10-CM | POA: Diagnosis not present

## 2023-08-22 DIAGNOSIS — J45909 Unspecified asthma, uncomplicated: Secondary | ICD-10-CM | POA: Diagnosis not present

## 2023-08-22 DIAGNOSIS — I509 Heart failure, unspecified: Secondary | ICD-10-CM | POA: Diagnosis not present

## 2023-08-22 DIAGNOSIS — G4733 Obstructive sleep apnea (adult) (pediatric): Secondary | ICD-10-CM | POA: Diagnosis not present

## 2023-09-09 ENCOUNTER — Ambulatory Visit: Payer: Medicare Other | Attending: Cardiology | Admitting: Cardiovascular Disease

## 2023-09-09 ENCOUNTER — Encounter: Payer: Self-pay | Admitting: Cardiovascular Disease

## 2023-09-09 VITALS — BP 140/68 | HR 64 | Ht 66.0 in | Wt 196.0 lb

## 2023-09-09 DIAGNOSIS — J4520 Mild intermittent asthma, uncomplicated: Secondary | ICD-10-CM | POA: Diagnosis not present

## 2023-09-09 DIAGNOSIS — G4733 Obstructive sleep apnea (adult) (pediatric): Secondary | ICD-10-CM | POA: Diagnosis not present

## 2023-09-09 DIAGNOSIS — G4719 Other hypersomnia: Secondary | ICD-10-CM

## 2023-09-09 DIAGNOSIS — E039 Hypothyroidism, unspecified: Secondary | ICD-10-CM | POA: Diagnosis not present

## 2023-09-09 DIAGNOSIS — R0683 Snoring: Secondary | ICD-10-CM

## 2023-09-09 DIAGNOSIS — I350 Nonrheumatic aortic (valve) stenosis: Secondary | ICD-10-CM

## 2023-09-09 DIAGNOSIS — I10 Essential (primary) hypertension: Secondary | ICD-10-CM | POA: Diagnosis not present

## 2023-09-09 NOTE — Progress Notes (Signed)
CPAP order sent to [facility] for new Resmed 11 and supplies, N30 mask. 09/09/23

## 2023-09-09 NOTE — Patient Instructions (Signed)
Medication Instructions:  No changes  *If you need a refill on your cardiac medications before your next appointment, please call your pharmacy*   Lab Work: No labs If you have labs (blood work) drawn today and your tests are completely normal, you will receive your results only by: MyChart Message (if you have MyChart) OR A paper copy in the mail If you have any lab test that is abnormal or we need to change your treatment, we will call you to review the results.   Testing/Procedures: None   Follow-Up: At [facility], you and your health needs are our priority.  As part of our continuing mission to provide you with exceptional heart care, we have created designated Provider Care Teams.  These Care Teams include your primary Cardiologist (physician) and Advanced Practice Providers (APPs -  Physician Assistants and Nurse Practitioners) who all work together to provide you with the care you need, when you need it.  We recommend signing up for the patient portal called "MyChart".  Sign up information is provided on this After Visit Summary.  MyChart is used to connect with patients for Virtual Visits (Telemedicine).  Patients are able to view lab/test results, encounter notes, upcoming appointments, etc.  Non-urgent messages can be sent to your provider as well.   To learn more about what you can do with MyChart, go to [email or web address].    Your next appointment:   4 month(s)  Provider:   Nicki Guadalajara, MD

## 2023-09-09 NOTE — Progress Notes (Addendum)
PCP: Dr. Lupita Raider Primary cardiologist: Dr. Royann Shivers; Dr. Tresa Endo (Sleep)   HPI: Charlotte [name] is a 72 y.o. female who presents for a 17 month follow-up evaluation.    Charlotte [name] has a history of asthma, hypertension, hyperlipidemia, hypothyroidism, and had been followed by Dr. Royann Shivers for cardiology care.  She was referred for a sleep study due to concerns of obstructive sleep apnea.  She underwent a split-night study on 04/01/2016 which demonstrated severe obstructive sleep apnea with an AHI of 32.7 per hour.  She had a significant positional component with supine sleep AHI at 42.4 per hour.  She was unable to achieve any REM sleep on the baseline portion of the study.  CPAP titration was initiated titrated up to 11 cm water pressure.  She had soft snoring.  There was a significantly abnormal arousal index area.  She was in sinus rhythm and had a rare PAC.  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  She was set up with CPAP on 05/03/2016. A download was obtained from 05/20/2016 through August 1 17.  She had only 27% of days of usage and only 17% of days greater than 4 hours of use.  She was only averaging 4 hours and 24 minutes.  On 11 cm water pressure, her AHI was 4.0, with an apnea index was 3.8 with a hypopnea index of 0.2.  Since she underwent shoulder surgery  she has been sleeping in a recliner since her surgery.  She has had pain in her shoulder which has been limiting her sleep.  When I saw her in August 2017 an Epworth sleepiness scale score was calculated  as shown below and this endorsed at 16 compatible with significant excessive daytime sleepiness.  Epworth Sleepiness Scale: Situation   Chance of Dozing/Sleeping (0 = never , 1 = slight chance , 2 = moderate chance , 3 = high chance )   sitting and reading 2   watching TV 2   sitting inactive in a public place 2   being a passenger in a motor vehicle for an hour or  more 2   lying down in the afternoon 2   sitting and talking to someone 2   sitting quietly after lunch (no alcohol) 2   while stopped for a few minutes in traffic as the driver 2   Total Score  16   She was evaluated by me in July 2019.  She had  been seen by Abelina Bachelor, PsyD for a neurology evaluation there has been some issues with cognitive functioning.  During that evaluation it was recommended that with complaints of poor sleep the fact that she had not been using her CPAP therapy at this could be exacerbating underlying dementia.  She had stopped CPAP for some time.  During that evaluation stated that she would try to reinstitute therapy.  Download was obtained 1 month later which did show improve use but she was still not meeting compliance standards with 73% of usage days and only 60% of usage greater than 4 hours.  AHI was 3.2.  She was set on a total device with a range of 7-20 with 95th percentile pressure 11.6.   I saw her June 2021 and since her prior evaluation is normal she needs she is now followed primarily by Dr. Cam Hai and has not seen Dr. Rubie Maid. She has had issues with increasing blood pressure and her  lisinopril HCT dose was recently increased 6 months ago to 20/25 mg.  She is continue to take furosemide 20 mg daily.  I obtained a new download of her CPAP from May 29 through May 14, 2020 which again shows poor compliance with only 6 out of 30 days of usage.  Her AHI however is excellent when used at 0.6.  She continues to be on levothyroxine 50 mcg for hypothyroidism.  She is on simvastatin 40 mg for hyperlipidemia.  LDL cholesterol in April 2021 was 101.  During that evaluation, abnormal discussion with her regarding regarding the effects of untreated sleep apnea and her cardiovascular health.  Her blood pressure remained elevated and amlodipine 5 mg was added to her medical regimen.  Her LDL remains elevated despite being on simvastatin I suggested she change  to rosuvastatin 20 mg a more aggressive in therapy.  When I saw her in June 12, 2021 she was doing well and was consistently using her CPAP therapy and admitted that she feels so much better.  She is unaware of any breakthrough snoring.  An Epworth Sleepiness Scale score was calculated in the office today and this endorsed at 7 arguing against residual daytime sleepiness.  Her CPAP machine set up date was May 03, 2016.  Her machine is 3G and not 5G compatible and as result we were unable to obtain any wireless download data after April of this year when did not work now only allows 5G compatibility.  However a download was obtained from March 1 through February 16, 2021 which confirms excellent compliance.  She is averaging 7 hours and 52 minutes of CPAP use per night. Her ResMed AirSense 10 AutoSet unit is set at a minimum pressure of 6 with potential maximum of 20 cm.  Her 95th percentile pressure is 10.6 with a maximum average pressure of 11.6.  AHI is excellent at 0.8.  Since I added amlodipine last year, her blood pressure has improved.  In addition with the change to rosuvastatin, lipid studies significantly improved with most recent LDL cholesterol at 64 when drawn by Dr. Cam Hai on Mar 23, 2021.  At that time, her machine was no longer wireless since it is not 5G compatible.  She qualified for new CPAP machine and I discussed with her due to supply chain issues this may take 6 months or more to obtain.  I last saw her on Mar 20, 2022.  [name] received a new ResMed air sense 11 AutoSet unit on January 02, 2022.  An initial download was obtained from March 26 through March 11, 2022 and compliance is met with usage days at 77% and usage greater than 4 hours at 70%.  However average use was only 6 hours.  Her pressure setting range is from 6 to 20 cm and AHI was 0.6.  Recently, however, usage has slightly declined and on a download from April 4 through Mar 20, 2022 usage greater than 4 hours was 67%.   Average use was 6 hours.  She states this was mainly due to to allergies creating significant nasal stuffiness.  AHI was 0.6/h.    She underwent an echo Doppler study ordered by Dr. Royann Shivers on April 26, 2022 which showed normal systolic function without wall motion abnormality and with mild LVH.  There was mild aortic valve stenosis with a mean gradient of 11 mmHg.  Since I last saw her, she never established with a new DME company after [facility] departed from the  CPAP business.  She has needed new supplies and apparently has not been able to receive them.  As result of worn out tubing and mask, her CPAP use has declined.  Typically she goes to bed at 10 PM and wakes up at 6 AM.  Her download from August 21 through August 07, 2023 showed average use of CPAP at 5 hours and 30 minutes but she did not use therapy many days since she did not feel the equipment was adequate and usage was only 37%.  When used, AHI is 1.0.  95th percentile pressure was 11.8 with maximum average pressure at 13.0.  A subsequent download from September 20 to September 06, 2023  again confirms  further reduction in usage due to poor equipment but when used, AHI is excellent at 0.7.  Her CPAP PAP has been set at a pressure range of 6 to 20 cm and 95th percentile pressure was 15.1 with maximum average pressure at 16.9.  She has had worsening asthma and was concerned about using her CPAP with bronchospasm.  She is followed by Dr. Cam Hai for asthma.  She presents for evaluation.   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 04/01/2017   Chest pain at rest 01/27/2016   Chronic diastolic heart failure 04/22/2016   Decreased estrogen level    Degeneration of lumbar intervertebral disc    Essential hypertension 01/27/2016   Exertional dyspnea 01/27/2016   GERD (gastroesophageal  reflux disease)    HNP (herniated nucleus pulposus) with myelopathy, cervical 04/01/2017   Hypersomnolence 01/27/2016   Hypothyroidism 01/27/2016   Idiopathic scoliosis of lumbar spine    Impaired fasting glucose    Injury of superior glenoid labrum of shoulder joint 03/12/2018   Insomnia    Lumbar stenosis with neurogenic claudication 08/23/2021   Major depressive disorder    MCI (mild cognitive impairment) 03/05/2018   Mixed hyperlipidemia 01/27/2016   Murmur, cardiac 01/27/2016   Neuropathy    Non-toxic goiter    OSA (obstructive sleep apnea) 04/22/2016   nightly CPAP use   Osteoarthritis    Pain in joint of left shoulder 03/12/2018   Pericardial effusion 01/27/2016   PONV (postoperative nausea and vomiting)    Pre-diabetes    Restless legs    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   Shortness of breath dyspnea    relating to asthma   Syncope 01/27/2016    Past Surgical History:  Procedure Laterality Date   ABDOMINAL HYSTERECTOMY     BACK SURGERY  08/23/2021   BREAST REDUCTION SURGERY  2011   CERVICAL DISC ARTHROPLASTY N/A 04/01/2017   Procedure: CERVICAL ANTERIOR DISC ARTHROPLASTY C5-C6;  Surgeon: Barnett Abu, MD;  Location: [facility] OR;  Service: Neurosurgery;  Laterality: N/A;   COLONOSCOPY     ESOPHAGOGASTRODUODENOSCOPY     EYE SURGERY     bilateral cataracts   JOINT REPLACEMENT     right shoulder replacement   REDUCTION MAMMAPLASTY     REVERSE SHOULDER ARTHROPLASTY Right 08/02/2016   Procedure: REVERSE SHOULDER ARTHROPLASTY;  Surgeon: Beverely Low, MD;  Location: [facility] OR;  Service: Orthopedics;  Laterality: Right;    Allergies  Allergen Reactions   Desvenlafaxine Succinate Er Other (See Comments)     Current Outpatient Medications  Medication Sig Dispense Refill   albuterol (PROVENTIL HFA;VENTOLIN HFA) 108 (90 Base) MCG/ACT inhaler  Inhale 2 puffs into the lungs every 6 (six) hours as needed for wheezing or shortness of breath.     amLODipine (NORVASC) 5 MG  tablet TAKE 1 TABLET BY MOUTH DAILY 90 tablet 0   aspirin 81 MG tablet Take 81 mg by mouth daily.     azithromycin (ZITHROMAX) 250 MG tablet Take 1 tablet (250 mg total) by mouth daily. Take first 2 tablets together, then 1 every day until finished. 6 tablet 0   donepezil (ARICEPT) 10 MG tablet Take 10 mg by mouth at bedtime.     furosemide (LASIX) 20 MG tablet Take 1 tablet (20 mg total) by mouth daily. NEED OV. 30 tablet 0   levothyroxine (SYNTHROID, LEVOTHROID) 50 MCG tablet Take 50 mcg by mouth daily.      lisinopril-hydrochlorothiazide (ZESTORETIC) 20-25 MG tablet Take 1 tablet by mouth daily.     methocarbamol (ROBAXIN) 500 MG tablet Take 1 tablet (500 mg total) by mouth every 6 (six) hours as needed for muscle spasms. 40 tablet 3   rOPINIRole (REQUIP) 1 MG tablet Take 1 mg by mouth 2 (two) times daily.     rosuvastatin (CRESTOR) 20 MG tablet TAKE 1 TABLET BY MOUTH DAILY 30 tablet 11   sertraline (ZOLOFT) 100 MG tablet Take 100 mg by mouth daily.     TRELEGY ELLIPTA 100-62.5-25 MCG/INH AEPB Inhale 1 puff into the lungs every other day. As needed     vitamin B-12 (CYANOCOBALAMIN) 1000 MCG tablet Take 1,000 mcg by mouth daily.     rOPINIRole (REQUIP) 0.5 MG tablet Take 0.5 mg by mouth at bedtime.     No current facility-administered medications for this visit.    Social History   Socioeconomic History   Marital status: Widowed    Spouse name: Not on file   Number of children: Not on file   Years of education: 9   Highest education level: 9th grade  Occupational History   Occupation: Retired    Comment: OR attendant  Tobacco Use   Smoking status: Never   Smokeless tobacco: Never  Vaping Use   Vaping status: Never Used  Substance and Sexual Activity   Alcohol use: No    Alcohol/week: 0.0 standard drinks of alcohol   Drug use: No   Sexual activity: Not on file  Other Topics Concern   Not on file  Social History Narrative   Epworth Sleepiness Scale = 17 (as of 02/16/2016)    Right handed   Drinks caffeine   Lives alone   One story home   Social Determinants of Health   Financial Resource Strain: Not on file  Food Insecurity: No Food Insecurity (12/18/2022)   Hunger Vital Sign    Worried About Running Out of Food in the Last Year: Never true    Ran Out of Food in the Last Year: Never true  Transportation Needs: No Transportation Needs (12/18/2022)   PRAPARE - Administrator, Civil Service (Medical): No    Lack of Transportation (Non-Medical): No  Physical Activity: Sufficiently Active (12/18/2022)   Exercise Vital Sign    Days of Exercise per Week: 4 days    Minutes of Exercise per Session: 40 min  Recent Concern: Physical Activity - Insufficiently Active (12/18/2022)   Exercise Vital Sign    Days of Exercise per Week: 4 days    Minutes of Exercise per Session: 20 min  Stress: Not on file  Social Connections: Not on file  Intimate Partner Violence: Not  on file    Family History  Problem Relation Age of Onset   Heart failure Mother    Diabetes Mother    Hypertension Mother    Hyperlipidemia Mother    Lung cancer Father    Diabetes Father    Hypertension Father    Hypertension Sister    Hyperlipidemia Sister    Hyperthyroidism Sister    Lung cancer Brother    Rheum arthritis Child    Lung cancer Child    Dementia Maternal Grandmother    Heart disease Maternal Grandfather    Lung cancer Paternal Grandfather    Diabetes Brother    Hyperlipidemia Brother    Hyperlipidemia Brother    Dementia Maternal Aunt      ROS General: Negative; No fevers, chills, or night sweats HEENT: Negative; No changes in vision or hearing, sinus congestion, difficulty swallowing Pulmonary: Positive for asthma Cardiovascular: Mild aortic stenosis GI: Negative; No nausea, vomiting, diarrhea, or abdominal pain GU: Negative; No dysuria, hematuria, or difficulty voiding Musculoskeletal: Low back discomfort at L4 and 5 Hematologic: Negative; no easy  bruising, bleeding Endocrine: Positive for hypothyroidism. Neuro: Intermittent sciatica down her left leg Skin: Negative; No rashes or skin lesions Psychiatric: Negative; No behavioral problems, depression Sleep: Positive for OSA.  On CPAP with reduced compliance.  Need to establish with a new DME company and obtain supplies positive for daytime sleepiness, and hypersomnolence; no bruxism, restless legs, hypnogognic hallucinations, no cataplexy   Physical Exam BP (!) 140/68   Pulse 64   Ht 5\' 6"  (1.676 m)   Wt 196 lb (88.9 kg)   SpO2 92%   BMI 31.64 kg/m    Repeat blood pressure when taken by me elevated at 160/72 but the patient was visibly upset about her CPAP equipment and never being contacted by the DME company   Wt Readings from Last 3 Encounters:  09/09/23 196 lb (88.9 kg)  05/21/23 194 lb (88 kg)  03/29/23 190 lb (86.2 kg)   General: Alert, oriented, no distress.  Skin: normal turgor, no rashes, warm and dry HEENT: Normocephalic, atraumatic. Pupils equal round and reactive to light; sclera anicteric; extraocular muscles intact;  Nose without nasal septal hypertrophy Mouth/Parynx benign; Mallinpatti scale 3 Neck: No JVD, no carotid bruits; normal carotid upstroke Lungs: clear to ausculatation and percussion; no wheezing or rales Chest wall: without tenderness to palpitation Heart: PMI not displaced, RRR, s1 s2 normal, 2/6 systolic murmur consistent with aortic stenosis, no diastolic murmur, no rubs, gallops, thrills, or heaves Abdomen: soft, nontender; no hepatosplenomehaly, BS+; abdominal aorta nontender and not dilated by palpation. Back: no CVA tenderness Pulses 2+ Musculoskeletal: full range of motion, normal strength, no joint deformities Extremities: no clubbing cyanosis or edema, Homan's sign negative  Neurologic: grossly nonfocal; Cranial nerves grossly wnl Psychologic: Normal mood and affect     Mar 20, 2022 ECG (independently read by me): Sinus bradycardia  at 58  June 12, 2021 ECG (independently read by me): Sinus bradycardia 58 bpm with isolated PAC.  No ST segment changes.  Normal intervals.  June 2021 ECG (independently read by me): Sinus bradycardia 59 bpm.  No ectopy.  Normal intervals.  July 2019 ECG (independently read by me): Normal sinus rhythm at 64 bpm.  Baseline wander.  No ectopy.  Normal intervals.  ECG from 04/22/2016 was reviewed which showed sinus rhythm with inferolateral ST changes  LABS:     Latest Ref Rng & Units 08/24/2021    1:46 AM 08/20/2021  8:37 AM 04/01/2017    2:35 PM  BMP  Glucose 70 - 99 mg/dL 119  147  96   BUN 8 - 23 mg/dL 23  25  14    Creatinine 0.44 - 1.00 mg/dL 8.29  5.62  1.30   Sodium 135 - 145 mmol/L 139  139  140   Potassium 3.5 - 5.1 mmol/L 2.9  3.3  3.7   Chloride 98 - 111 mmol/L 104  102  108   CO2 22 - 32 mmol/L 25  27  25    Calcium 8.9 - 10.3 mg/dL 8.5  9.4  9.3          No data to display              Latest Ref Rng & Units 08/25/2021    4:03 AM 08/24/2021    1:46 AM 08/20/2021    8:37 AM  CBC  WBC 4.0 - 10.5 K/uL  7.7  7.8   Hemoglobin 12.0 - 15.0 g/dL 86.5  8.5  78.4   Hematocrit 36.0 - 46.0 % 33.6  25.0  36.1   Platelets 150 - 400 K/uL  116  161      Lipid Panel  No results found for: "CHOL", "TRIG", "HDL", "CHOLHDL", "VLDL", "LDLCALC", "LDLDIRECT"   RADIOLOGY: No results found.  IMPRESSION: 1. OSA (obstructive sleep apnea)   2. Essential hypertension   3. Mild aortic stenosis   4. Mild intermittent asthma without complication   5. Snoring   6. Excessive daytime sleepiness   7. Acquired hypothyroidism     ASSESSMENT AND PLAN: Ms. Jaici Conrod is a 30 -year-old female who has a history of hypertension, asthma, hypothyroidism, hyperlipidemia, and was found to have severe obstructive sleep apnea on her sleep study from May 2017.  On the diagnostic portion of the study, her AHI was 32.7 per hour but her RDI was significantly increased at 67.8 per hour.  She  was unable to achieve any REM sleep.  She was titrated up to 11 cm water pressure with excellent benefit.  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  When I saw her in 2019 she had not used CPAP for several years.  At that time I had lengthy discussion with her and she did initially reinitiate therapy with subsequent download 1 month later.  When seen in June 2021 her compliance again had become poor and during that time had an extensive discussion with her regarding the potential adverse cardiovascular consequences of untreated sleep apnea with particular reference to its effect on blood pressure control, potential for nocturnal arrhythmias, glucose as well as increased inflammation, GERD, and potential nocturnal hypoxemia contributing to ischemia.  She subsequently improved compliance.  Unfortunately, her DME company which she had received supplies from was no longer providing CPAP care.  She was supposed to be transition to another DME company and this may have occurred at the time of our sleep coordinator retired and there was several months of a new sleep coordinator taking over.  I reviewed her most recent downloads and when she uses CPAP's AHI is excellent.  However sleep duration with CPAP use continues to be suboptimal at only 5-1/2 hours.  I again discussed optimal sleep duration at 0708 hrs. if at all possible and the importance of using CPAP for the nights entirety.  I also discussed with her the potential benefit of CPAP if she was having bronchospasm at sleep since this may  allow her airways to just stay more open.  Since she has not been using her CPAP consistently, she has experienced more daytime sleepiness as well as snoring.  I have contacted our new sleep coordinator to try to expedite her set up with new DME company based on her insurance so that she can receive tubing mask and other supplies which are necessary.  She will be following up with Dr. Royann Shivers  for cardiology evaluation and sees Dr. Cam Hai for primary care.  She has aortic stenosis which has been mild on prior echo.  I will see her in 4 months for follow-up sleep evaluation.   Lennette Bihari, MD, [facility], ABSM Diplomate, American Board of Sleep Medicine   09/14/2023 1:20 PM

## 2023-09-14 ENCOUNTER — Encounter: Payer: Self-pay | Admitting: Cardiovascular Disease

## 2023-09-14 NOTE — Addendum Note (Signed)
Addended by: Nicki Guadalajara A on: 09/14/2023 01:21 PM   Modules accepted: Level of Service

## 2023-09-16 DIAGNOSIS — J4540 Moderate persistent asthma, uncomplicated: Secondary | ICD-10-CM | POA: Diagnosis not present

## 2023-09-16 DIAGNOSIS — J3081 Allergic rhinitis due to animal (cat) (dog) hair and dander: Secondary | ICD-10-CM | POA: Diagnosis not present

## 2023-09-16 DIAGNOSIS — J301 Allergic rhinitis due to pollen: Secondary | ICD-10-CM | POA: Diagnosis not present

## 2023-09-16 DIAGNOSIS — J3089 Other allergic rhinitis: Secondary | ICD-10-CM | POA: Diagnosis not present

## 2023-09-18 ENCOUNTER — Encounter: Payer: Self-pay | Admitting: Cardiovascular Disease

## 2023-09-23 ENCOUNTER — Ambulatory Visit: Payer: Medicare Other | Attending: Cardiovascular Disease | Admitting: Cardiovascular Disease

## 2023-09-23 ENCOUNTER — Encounter: Payer: Self-pay | Admitting: Cardiovascular Disease

## 2023-09-23 VITALS — BP 167/71 | HR 63 | Ht 66.0 in | Wt 194.2 lb

## 2023-09-23 DIAGNOSIS — I350 Nonrheumatic aortic (valve) stenosis: Secondary | ICD-10-CM

## 2023-09-23 DIAGNOSIS — R0602 Shortness of breath: Secondary | ICD-10-CM

## 2023-09-23 DIAGNOSIS — E039 Hypothyroidism, unspecified: Secondary | ICD-10-CM | POA: Diagnosis not present

## 2023-09-23 DIAGNOSIS — I5032 Chronic diastolic (congestive) heart failure: Secondary | ICD-10-CM | POA: Diagnosis not present

## 2023-09-23 DIAGNOSIS — G4733 Obstructive sleep apnea (adult) (pediatric): Secondary | ICD-10-CM

## 2023-09-23 DIAGNOSIS — E782 Mixed hyperlipidemia: Secondary | ICD-10-CM | POA: Diagnosis not present

## 2023-09-23 DIAGNOSIS — I10 Essential (primary) hypertension: Secondary | ICD-10-CM

## 2023-09-23 DIAGNOSIS — R7303 Prediabetes: Secondary | ICD-10-CM
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
## 2023-09-23 NOTE — Patient Instructions (Signed)
Medication Instructions:  No changes *If you need a refill on your cardiac medications before your next appointment, please call your pharmacy*  Testing/Procedures: Your physician has requested that you have an echocardiogram. Echocardiography is a painless test that uses sound waves to create images of your heart. It provides your doctor with information about the size and shape of your heart and how well your heart's chambers and valves are working. This procedure takes approximately one hour. There are no restrictions for this procedure. Please do NOT wear cologne, perfume, aftershave, or lotions (deodorant is allowed). Please arrive 15 minutes prior to your appointment time.  Please note: We ask at that you not bring children with you during ultrasound (echo/ vascular) testing. Due to room size and safety concerns, children are not allowed in the ultrasound rooms during exams. Our front office staff cannot provide observation of children in our lobby area while testing is being conducted. An adult accompanying a patient to their appointment will only be allowed in the ultrasound room at the discretion of the ultrasound technician under special circumstances. We apologize for any inconvenience.    Follow-Up: At [facility], you and your health needs are our priority.  As part of our continuing mission to provide you with exceptional heart care, we have created designated Provider Care Teams.  These Care Teams include your primary Cardiologist (physician) and Advanced Practice Providers (APPs -  Physician Assistants and Nurse Practitioners) who all work together to provide you with the care you need, when you need it.  We recommend signing up for the patient portal called "MyChart".  Sign up information is provided on this After Visit Summary.  MyChart is used to connect with patients for Virtual Visits (Telemedicine).  Patients are able to view lab/test results, encounter notes, upcoming  appointments, etc.  Non-urgent messages can be sent to your provider as well.   To learn more about what you can do with MyChart, go to [email or web address].    Your next appointment:   1 year(s)  Provider:   Thurmon Fair, MD

## 2023-09-30 ENCOUNTER — Telehealth: Payer: Self-pay | Admitting: Cardiovascular Disease

## 2023-09-30 NOTE — Telephone Encounter (Signed)
Patient is requesting for a call back from [place] or [place] about her CPAP

## 2023-10-04 DIAGNOSIS — J301 Allergic rhinitis due to pollen: Secondary | ICD-10-CM | POA: Diagnosis not present

## 2023-10-06 DIAGNOSIS — J301 Allergic rhinitis due to pollen: Secondary | ICD-10-CM | POA: Diagnosis not present

## 2023-10-07 DIAGNOSIS — J3081 Allergic rhinitis due to animal (cat) (dog) hair and dander: Secondary | ICD-10-CM | POA: Diagnosis not present

## 2023-10-21 DIAGNOSIS — E782 Mixed hyperlipidemia: Secondary | ICD-10-CM | POA: Diagnosis not present

## 2023-10-21 DIAGNOSIS — M5136 Other intervertebral disc degeneration, lumbar region with discogenic back pain only: Secondary | ICD-10-CM | POA: Diagnosis not present

## 2023-10-21 DIAGNOSIS — I130 Hypertensive heart and chronic kidney disease with heart failure and stage 1 through stage 4 chronic kidney disease, or unspecified chronic kidney disease: Secondary | ICD-10-CM | POA: Diagnosis not present

## 2023-10-21 DIAGNOSIS — I5030 Unspecified diastolic (congestive) heart failure: Secondary | ICD-10-CM | POA: Diagnosis not present

## 2023-10-21 DIAGNOSIS — Z23 Encounter for immunization: Secondary | ICD-10-CM | POA: Diagnosis not present

## 2023-10-21 DIAGNOSIS — E039 Hypothyroidism, unspecified: Secondary | ICD-10-CM | POA: Diagnosis not present

## 2023-10-21 DIAGNOSIS — Z0000 Encounter for general adult medical examination without abnormal findings: Secondary | ICD-10-CM | POA: Diagnosis not present

## 2023-10-21 DIAGNOSIS — E1122 Type 2 diabetes mellitus with diabetic chronic kidney disease: Secondary | ICD-10-CM | POA: Diagnosis not present

## 2023-10-21 DIAGNOSIS — I350 Nonrheumatic aortic (valve) stenosis: Secondary | ICD-10-CM | POA: Diagnosis not present

## 2023-10-21 DIAGNOSIS — R413 Other amnesia: Secondary | ICD-10-CM | POA: Diagnosis not present

## 2023-10-21 DIAGNOSIS — I110 Hypertensive heart disease with heart failure: Secondary | ICD-10-CM | POA: Diagnosis not present

## 2023-10-23 NOTE — Telephone Encounter (Signed)
Reached out to the patient and she said everything has been worked out. She is now with [facility] in [facility].  Upon patient request DME selection is [employer]. Patient understands he will be contacted by [facility] to set up his cpap. Patient understands to call if [employer] does not contact him with new setup in a timely manner. Patient understands they will be called once confirmation has been received from [facility]  that they have received their new machine to schedule 10 week follow up appointment.   [facility] notified of new cpap order  Please add to airview Patient was grateful for the call and thanked me.

## 2023-10-27 ENCOUNTER — Ambulatory Visit (HOSPITAL_COMMUNITY): Payer: Medicare Other | Attending: Cardiology

## 2023-10-27 DIAGNOSIS — I350 Nonrheumatic aortic (valve) stenosis: Secondary | ICD-10-CM | POA: Insufficient documentation

## 2023-10-27 DIAGNOSIS — R0602 Shortness of breath: Secondary | ICD-10-CM | POA: Diagnosis not present

## 2023-10-27 LAB — ECHOCARDIOGRAM COMPLETE
AR max vel: 1.71 cm2
AV Area VTI: 1.72 cm2
AV Area mean vel: 1.75 cm2
AV Mean grad: 18 mm[Hg]
AV Peak grad: 34.2 mm[Hg]
Ao pk vel: 2.93 m/s
Area-P 1/2: 3.66 cm2
MV VTI: 2.12 cm2
P 1/2 time: 632 ms
S' Lateral: 2.25 cm

## 2023-10-28 DIAGNOSIS — J301 Allergic rhinitis due to pollen: Secondary | ICD-10-CM | POA: Diagnosis not present

## 2023-10-28 DIAGNOSIS — J4540 Moderate persistent asthma, uncomplicated: Secondary | ICD-10-CM | POA: Diagnosis not present

## 2023-10-28 DIAGNOSIS — J3081 Allergic rhinitis due to animal (cat) (dog) hair and dander: Secondary | ICD-10-CM | POA: Diagnosis not present

## 2023-10-28 DIAGNOSIS — J3089 Other allergic rhinitis: Secondary | ICD-10-CM | POA: Diagnosis not present

## 2023-10-29 DIAGNOSIS — J3081 Allergic rhinitis due to animal (cat) (dog) hair and dander: Secondary | ICD-10-CM | POA: Diagnosis not present

## 2023-10-29 DIAGNOSIS — J301 Allergic rhinitis due to pollen: Secondary | ICD-10-CM | POA: Diagnosis not present

## 2023-10-29 DIAGNOSIS — J3089 Other allergic rhinitis: Secondary | ICD-10-CM | POA: Diagnosis not present

## 2023-11-04 DIAGNOSIS — J301 Allergic rhinitis due to pollen: Secondary | ICD-10-CM | POA: Diagnosis not present

## 2023-11-04 DIAGNOSIS — J3081 Allergic rhinitis due to animal (cat) (dog) hair and dander: Secondary | ICD-10-CM | POA: Diagnosis not present

## 2023-11-04 DIAGNOSIS — J3089 Other allergic rhinitis: Secondary | ICD-10-CM | POA: Diagnosis not present

## 2023-11-17 DIAGNOSIS — J3089 Other allergic rhinitis: Secondary | ICD-10-CM | POA: Diagnosis not present

## 2023-11-17 DIAGNOSIS — J301 Allergic rhinitis due to pollen: Secondary | ICD-10-CM | POA: Diagnosis not present

## 2023-11-17 DIAGNOSIS — J3081 Allergic rhinitis due to animal (cat) (dog) hair and dander: Secondary | ICD-10-CM | POA: Diagnosis not present

## 2023-11-21 ENCOUNTER — Ambulatory Visit: Payer: Medicare Other | Admitting: Physician Assistant

## 2023-11-21 ENCOUNTER — Encounter: Payer: Self-pay | Admitting: Physician Assistant

## 2023-11-21 VITALS — BP 116/80 | HR 97 | Resp 20 | Ht 66.0 in | Wt 197.0 lb

## 2023-11-21 DIAGNOSIS — G3184 Mild cognitive impairment, so stated: Secondary | ICD-10-CM

## 2023-11-21 NOTE — Patient Instructions (Addendum)
 It was a pleasure to see you today at our office.   Recommendations:  Follow up in 7 months after neuropsych evaluation Continue donepezil  10 mg daily.   Repeat the neuropsychological evaluation 06/2023  Continue CPAP  Whom to call:  Memory  decline, memory medications: Call our office [phone]   For psychiatric meds, mood meds: Please have your primary care physician manage these medications.  If you have any severe symptoms of a stroke, or other severe issues such as confusion,severe chills or fever, etc call 911 or go to the ER as you may need to be evaluated further  You have been referred for a neuropsychological evaluation (i.e., evaluation of memory and thinking abilities). Please bring someone with you to this appointment if possible, as it is helpful for the doctor to hear from both you and another adult who knows you well. Please bring eyeglasses and hearing aids if you wear them.    The evaluation will take approximately 3 hours and has two parts:   The first part is a clinical interview with the neuropsychologist (Dr. Richie or Dr. Jackquline). During the interview, the neuropsychologist will speak with you and the individual you brought to the appointment.    The second part of the evaluation is testing with the doctor's technician Neal or Luke). During the testing, the technician will ask you to remember different types of material, solve problems, and answer some questionnaires. Your family member will not be present for this portion of the evaluation.   Please note: We must reserve several hours of the neuropsychologist's time and the psychometrician's time for your evaluation appointment. As such, there is a No-Show fee of $100. If you are unable to attend any of your appointments, please contact our office as soon as possible to reschedule.    RECOMMENDATIONS FOR ALL PATIENTS WITH MEMORY PROBLEMS: 1. Continue to exercise (Recommend 30 minutes of walking everyday, or 3  hours every week) 2. Increase social interactions - continue going to [place] and enjoy social gatherings with friends and family 3. Eat healthy, avoid fried foods and eat more fruits and vegetables 4. Maintain adequate blood pressure, blood sugar, and blood cholesterol level. Reducing the risk of stroke and cardiovascular disease also helps promoting better memory. 5. Avoid stressful situations. Live a simple life and avoid aggravations. Organize your time and prepare for the next day in anticipation. 6. Sleep well, avoid any interruptions of sleep and avoid any distractions in the bedroom that may interfere with adequate sleep quality 7. Avoid sugar, avoid sweets as there is a strong link between excessive sugar intake, diabetes, and cognitive impairment We discussed the Mediterranean diet, which has been shown to help patients reduce the risk of progressive memory disorders and reduces cardiovascular risk. This includes eating fish, eat fruits and green leafy vegetables, nuts like almonds and hazelnuts, walnuts, and also use olive oil. Avoid fast foods and fried foods as much as possible. Avoid sweets and sugar as sugar use has been linked to worsening of memory function.  There is always a concern of gradual progression of memory problems. If this is the case, then we may need to adjust level of care according to patient needs. Support, both to the patient and caregiver, should then be put into place.    The [employer] is here all day, every day for people facing Alzheimer's disease through our free 24/7 Helpline: [phone]. The Helpline provides reliable information and support to all those who need assistance, such as  individuals living with memory loss, Alzheimer's or other dementia, caregivers, health care professionals and the public.  Our highly trained and knowledgeable staff can help you with: Understanding memory loss, dementia and Alzheimer's  Medications and other  treatment options  General information about aging and brain health  Skills to provide quality care and to find the best care from professionals  Legal, financial and living-arrangement decisions Our Helpline also features: Confidential care consultation provided by master's level clinicians who can help with decision-making support, crisis assistance and education on issues families face every day  Help in a caller's preferred language using our translation service that features more than 200 languages and dialects  Referrals to local community programs, services and ongoing support     FALL PRECAUTIONS: Be cautious when walking. Scan the area for obstacles that may increase the risk of trips and falls. When getting up in the mornings, sit up at the edge of the bed for a few minutes before getting out of bed. Consider elevating the bed at the head end to avoid drop of blood pressure when getting up. Walk always in a well-lit room (use night lights in the walls). Avoid area rugs or power cords from appliances in the middle of the walkways. Use a walker or a cane if necessary and consider physical therapy for balance exercise. Get your eyesight checked regularly.  FINANCIAL OVERSIGHT: Supervision, especially oversight when making financial decisions or transactions is also recommended.  HOME SAFETY: Consider the safety of the kitchen when operating appliances like stoves, microwave oven, and blender. Consider having supervision and share cooking responsibilities until no longer able to participate in those. Accidents with firearms and other hazards in the house should be identified and addressed as well.   ABILITY TO BE LEFT ALONE: If patient is unable to contact 911 operator, consider using LifeLine, or when the need is there, arrange for someone to stay with patients. Smoking is a fire hazard, consider supervision or cessation. Risk of wandering should be assessed by caregiver and if detected at  any point, supervision and safe proof recommendations should be instituted.  MEDICATION SUPERVISION: Inability to self-administer medication needs to be constantly addressed. Implement a mechanism to ensure safe administration of the medications.   DRIVING: Regarding driving, in patients with progressive memory problems, driving will be impaired. We advise to have someone else do the driving if trouble finding directions or if minor accidents are reported. Independent driving assessment is available to determine safety of driving.   If you are interested in the driving assessment, you can contact the following:  [facility] [employer] [facility] in [place] [phone]  [facility] [phone]  [facility] [phone] or [phone]      Mediterranean Diet A Mediterranean diet refers to food and lifestyle choices that are based on the traditions of countries located on the [employer]. This way of eating has been shown to help prevent certain conditions and improve outcomes for people who have chronic diseases, like kidney disease and heart disease. What are tips for following this plan? Lifestyle  Cook and eat meals together with your family, when possible. Drink enough fluid to keep your urine clear or pale yellow. Be physically active every day. This includes: Aerobic exercise like running or swimming. Leisure activities like gardening, walking, or housework. Get 7-8 hours of sleep each night. If recommended by your health care provider, drink red wine in moderation. This means 1 glass a day for nonpregnant  women and 2 glasses a day for men. A glass of wine equals 5 oz (150 mL). Reading food labels  Check the serving size of packaged foods. For foods such as rice and pasta, the serving size refers to the amount of cooked product, not dry. Check the total fat in packaged foods. Avoid foods that have saturated fat or  trans fats. Check the ingredients list for added sugars, such as corn syrup. Shopping  At the grocery store, buy most of your food from the areas near the walls of the store. This includes: Fresh fruits and vegetables (produce). Grains, beans, nuts, and seeds. Some of these may be available in unpackaged forms or large amounts (in bulk). Fresh seafood. Poultry and eggs. Low-fat dairy products. Buy whole ingredients instead of prepackaged foods. Buy fresh fruits and vegetables in-season from local farmers markets. Buy frozen fruits and vegetables in resealable bags. If you do not have access to quality fresh seafood, buy precooked frozen shrimp or canned fish, such as tuna, salmon, or sardines. Buy small amounts of raw or cooked vegetables, salads, or olives from the deli or salad bar at your store. Stock your pantry so you always have certain foods on hand, such as olive oil, canned tuna, canned tomatoes, rice, pasta, and beans. Cooking  Cook foods with extra-virgin olive oil instead of using butter or other vegetable oils. Have meat as a side dish, and have vegetables or grains as your main dish. This means having meat in small portions or adding small amounts of meat to foods like pasta or stew. Use beans or vegetables instead of meat in common dishes like chili or lasagna. Experiment with different cooking methods. Try roasting or broiling vegetables instead of steaming or sauteing them. Add frozen vegetables to soups, stews, pasta, or rice. Add nuts or seeds for added healthy fat at each meal. You can add these to yogurt, salads, or vegetable dishes. Marinate fish or vegetables using olive oil, lemon juice, garlic, and fresh herbs. Meal planning  Plan to eat 1 vegetarian meal one day each week. Try to work up to 2 vegetarian meals, if possible. Eat seafood 2 or more times a week. Have healthy snacks readily available, such as: Vegetable sticks with hummus. Greek yogurt. Fruit and  nut trail mix. Eat balanced meals throughout the week. This includes: Fruit: 2-3 servings a day Vegetables: 4-5 servings a day Low-fat dairy: 2 servings a day Fish, poultry, or lean meat: 1 serving a day Beans and legumes: 2 or more servings a week Nuts and seeds: 1-2 servings a day Whole grains: 6-8 servings a day Extra-virgin olive oil: 3-4 servings a day Limit red meat and sweets to only a few servings a month What are my food choices? Mediterranean diet Recommended Grains: Whole-grain pasta. Brown rice. Bulgar wheat. Polenta. Couscous. Whole-wheat bread. [place] Made[place]. Vegetables: Artichokes. Beets. Broccoli. Cabbage. Carrots. Eggplant. Green beans. Chard. Kale. Spinach. Onions. Leeks. Peas. Squash. Tomatoes. Peppers. Radishes. Fruits: Apples. Apricots. Avocado. Berries. Bananas. Cherries. Dates. Figs. Grapes. Lemons. Melon. Oranges. Peaches. Plums. Pomegranate. Meats and other protein foods: Beans. Almonds. Sunflower seeds. Pine nuts. Peanuts. Cod. Salmon. Scallops. Shrimp. Tuna. Tilapia. Clams. Oysters. Eggs. Dairy: Low-fat milk. Cheese. Greek yogurt. Beverages: Water. Red wine. Herbal tea. Fats and oils: Extra virgin olive oil. Avocado oil. Grape seed oil. Sweets and desserts: Greek yogurt with honey. Baked apples. Poached pears. Trail mix. Seasoning and other foods: Basil. Cilantro. Coriander. Cumin. Mint. Parsley. Sage. Rosemary. Tarragon. Garlic. Oregano. Thyme. Pepper. Balsalmic vinegar.  Tahini. Hummus. Tomato sauce. Olives. Mushrooms. Limit these Grains: Prepackaged pasta or rice dishes. Prepackaged cereal with added sugar. Vegetables: Deep fried potatoes (french fries). Fruits: Fruit canned in syrup. Meats and other protein foods: Beef. Pork. Lamb. Poultry with skin. Hot dogs. [place]. Dairy: Ice cream. Sour cream. Whole milk. Beverages: Juice. Sugar-sweetened soft drinks. Beer. Liquor and spirits. Fats and oils: Butter. Canola oil. Vegetable oil. Beef fat (tallow).  Lard. Sweets and desserts: Cookies. Cakes. Pies. Candy. Seasoning and other foods: Mayonnaise. Premade sauces and marinades. The items listed may not be a complete list. Talk with your dietitian about what dietary choices are right for you. Summary The Mediterranean diet includes both food and lifestyle choices. Eat a variety of fresh fruits and vegetables, beans, nuts, seeds, and whole grains. Limit the amount of red meat and sweets that you eat. Talk with your health care provider about whether it is safe for you to drink red wine in moderation. This means 1 glass a day for nonpregnant women and 2 glasses a day for men. A glass of wine equals 5 oz (150 mL). This information is not intended to replace advice given to you by your health care provider. Make sure you discuss any questions you have with your health care provider. Document Released: 06/27/2016 Document Revised: 07/30/2016 Document Reviewed: 06/27/2016 Elsevier Interactive Patient Education  2017 [employer].

## 2023-11-21 NOTE — Progress Notes (Addendum)
 Assessment/Plan:   Mild cognitive impairment of unclear etiology  Charlotte [name] is a very pleasant 73 y.o. RH female with a history of hypertension, hyperlipidemia,  hypothyroidism, OSA on CPAP, insomnia, dCHF, anxiety, depression and a history of Mild Cognitive Impairment of unclear etiology  presenting today in follow-up for evaluation of memory loss. Patient is on donepezil  10 mg daily. Memory is stable, MMSE today 30/30.  Patient is able to participate on his ADLs and to drive without difficulties, mood is stable.       Recommendations:   Follow up in 6-7 months. Continue donepezil  10 mg daily. Side effects were discussed  Patient is scheduled for repeat neuropsych evaluation on August 2025 for diagnostic clarity and disease trajectory Continue CPAP for OSA Recommend good control of cardiovascular risk factors Continue to control mood as per PCP    Subjective:   This patient is here alone  who supplements the history. Previous records as well as any outside records available were reviewed prior to todays visit.   Patient was last seen on 05/2023 with MMSE 29/30      Any changes in memory since last visit?  About the same. Patient able to remember recent conversations and names of people.  Enjoys going to [employer], meeting friends, reading.  Does not do many brain games, but likes Candy Crush and jigsaw puzzles.  She likes to do her yard, play with her grandkids. repeats oneself?  Endorsed Disoriented when walking into a room?  Patient denies    Misplacing objects?  Patient denies   Wandering behavior?   denies   Any personality changes since last visit? Denies   Any worsening depression?: Denies   Hallucinations or paranoia? Denies   Seizures?   denies    Any sleep changes? Sleeps well . Occasional  vivid dreams, denies REM behavior or sleepwalking   Sleep apnea?  Endorsed, uses CPAP for the last 7 years.   Any hygiene concerns?   Denies.   Independent of  bathing and dressing?  Endorsed  Does the patient needs help with medications? Patient is in charge   Who is in charge of the finances?  Patient is in charge     Any changes in appetite?  Denies.     Patient have trouble swallowing?  Denies.   Does the patient cook? Yes, denies forgetting common recipes   Any kitchen accidents such as leaving the stove on?   denies   Any headaches?    denies   Vision changes? denies Chronic pain?  She has a history of lumbar stenosis with chronic pain, she sees neurosurgery (Dr. Colon)  and doing  pain management, takes tramadol  and spinal steroid shots.   ambulates with difficulty?    Denies, unless my back bothers me.    Recent falls or head injuries?    Denies.      Unilateral weakness, numbness or tingling?   Denies.   Any tremors?  Occasionally, only when holding a heavy object such as the coffee cup mostly on the L hand. Does not interfere with her ADLs. No other parkinsonian complaints. Any anosmia?    Denies.   Any incontinence of urine?  Denies.   Any bowel dysfunction?  Denies.      Patient lives alone  Does the patient drive?  Yes, denies getting lost     Initial Visit 06/13/21  The patient is seen in neurologic consultation at the request of Loreli Kins, MD for the evaluation of  memory.  The patient is here alone.  She is a 73 year old woman who had memory issues for about 3 years.  In 2/ 2019, the patient was seen by neuropsychology, Dr. Judeen and after extensive work-up, she was diagnosed with mild dementia, rule out Alzheimer's disease.  She was placed on Aricept  10 mg daily, and she is tolerating it well.  In addition, other issues contributed to her memory loss were mild major depressive disorder, and moderate generalized anxiety disorder. At the time, she was forgetting appointments, including jury duty twice in the Fall of 2018 even after being reminded on the same day.  She also was forgetting details of recent conversations,  forgetting recent events, repeating questions and statements, misplacing and losing things, word finding difficulty, and occasional difficulty following a conversation or comprehending what is being told to her.  She was not appearing to have any significant difficulty with attention and concentration, or visual spatial navigation. Psychiatry was recommended but patient declined follow up. During a visit to her PCP on 06/04/2021, she reported being a little more forgetful .  She was referred for further evaluation.  Although Aricept  helps, it feels that it is taking me a little longer to get the words out .  She gets to her room, and does not remember what she was coming there for.  The patient lives alone, but her sister lives next door, and monitors any changes.  She has been dealing with some depression, but this is not worse than prior.  She denies irritability.  Her sleep is poor because she has to get up a lot at night to go to the bathroom and wear a CPAP so uncomfortable.She does report vivid dreams, she calls them crazy dreams , and at times she acts them out.  She denies sleepwalking, although her dead husband used to tell her that she used to sleepwalk at times.  Denies hallucinations or paranoia.  She denies any issues with bathing and dressing.  Occasionally she forgets to take her medication.  She takes care of her own finances, without forgetting to pay any bills.  She denies leaving objects in unusual places.  Her appetite is good, denies trouble swallowing, or sialorrhea.  She cooks, and denies leaving the stove or the faucet on, or forgetting common recipes.  She ambulates without difficulty without a walker or a cane.  She denies any falls.  She has a history of right leg neuropathy, followed by Dr. Orlando, otherwise no focal numbness or tingling, unilateral weakness, but she does have mild tremors, which she reports having them for many years, and they have not gotten worse.  She does  not yet drop any objects or have any trouble writing or utilizing utensils.  She continues to drive without getting lost, she uses frequently the GPS.  She denies any headaches, or falls.  She denies any injuries to the head, double vision or dizziness.  Denies urine incontinence during the day.  She denies constipation or diarrhea.  Denies anosmia.  Denies any history of alcohol or tobacco.  Family history significant for dementia in maternal grandmother and 1 maternal aunt.  She is retired from the OR at [employer] since 2013.  She is widowed since 2010.  She has 3 daughters and 5 grandchildren.   Neuropsych evaluation Dr. Richie 10/19/21  Results suggested severe impairment surrounding all aspects of verbal memory, as well as verbal fluency (phonemic somewhat worse than semantic). Performance variability but generally below expectation performances were  also exhibited across the domain of executive functioning. Relative to her previous evaluation in 2019, nearly all cognitive performances were stable. Subtle decline was seen across one memory task (list learning), as well as aspects of both phonemic and semantic fluency. However, outside of this, no clinically significant decline was captured between her previous evaluation and current test performances. The etiology for ongoing impairment is unclear. Recent neuroimaging did not reveal any significant vascular risk factors and Charlotte [name] does not demonstrate strong behavioral features of Lewy body disease or frontotemporal lobar degeneration, making vascular dementia, Lewy body dementia, and frontotemporal dementia unlikely. Given significant impairment across verbal memory, continued considerations surrounding Alzheimer's disease as the underlying cause are certainly warranted, especially in the absence of a viable alternative. With that being said, it is also worth highlighting that Charlotte [name]'s profile is not consistent with typically presenting  Alzheimer's disease.      MRI brain 03/21/18 w and wo contrast  1. Normal MRI appearance of the brain for age. 2. 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   Brain MRI on 06/28/2021 revealed mild generalized cerebral atrophy and minimal small vessel ischemic changes. 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 04/01/2017   Chest pain at rest 01/27/2016   Chronic diastolic heart failure 04/22/2016   Decreased estrogen level    Degeneration of lumbar intervertebral disc    Essential hypertension 01/27/2016   Exertional dyspnea 01/27/2016   GERD (gastroesophageal reflux disease)    HNP (herniated nucleus pulposus) with myelopathy, cervical 04/01/2017   Hypersomnolence 01/27/2016   Hypothyroidism 01/27/2016   Idiopathic scoliosis of lumbar spine    Impaired fasting glucose    Injury of superior glenoid labrum of shoulder joint 03/12/2018   Insomnia    Lumbar stenosis with neurogenic claudication 08/23/2021   Major depressive disorder    MCI (mild cognitive impairment) 03/05/2018   Mixed hyperlipidemia 01/27/2016   Murmur, cardiac 01/27/2016   Neuropathy    Non-toxic goiter    OSA (obstructive sleep apnea) 04/22/2016   nightly CPAP use   Osteoarthritis    Pain in joint of left shoulder 03/12/2018   Pericardial effusion 01/27/2016   PONV (postoperative nausea and vomiting)    Pre-diabetes    Restless legs    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   Shortness of breath dyspnea    relating to asthma   Syncope 01/27/2016     Past Surgical History:  Procedure Laterality Date   ABDOMINAL HYSTERECTOMY     BACK SURGERY  08/23/2021   BREAST REDUCTION SURGERY  2011   CERVICAL DISC ARTHROPLASTY N/A 04/01/2017   Procedure: CERVICAL ANTERIOR DISC ARTHROPLASTY C5-C6;  Surgeon: Colon Shove, MD;  Location: [facility] OR;  Service:  Neurosurgery;  Laterality: N/A;   COLONOSCOPY     ESOPHAGOGASTRODUODENOSCOPY     EYE SURGERY     bilateral cataracts   JOINT REPLACEMENT     right shoulder replacement   REDUCTION MAMMAPLASTY     REVERSE SHOULDER ARTHROPLASTY Right 08/02/2016   Procedure: REVERSE SHOULDER ARTHROPLASTY;  Surgeon: Marcey Her, MD;  Location: [facility] OR;  Service: Orthopedics;  Laterality: Right;     PREVIOUS MEDICATIONS:   CURRENT MEDICATIONS:  Outpatient Encounter Medications as of 11/21/2023  Medication Sig   ACCU-CHEK GUIDE test strip 1 each 2 (two) times a week.  Accu-Chek Softclix Lancets lancets SMARTSIG:Topical Daily on Weekdays   albuterol  (PROVENTIL  HFA;VENTOLIN  HFA) 108 (90 Base) MCG/ACT inhaler Inhale 2 puffs into the lungs every 6 (six) hours as needed for wheezing or shortness of breath.   amLODipine  (NORVASC ) 5 MG tablet TAKE 1 TABLET BY MOUTH DAILY   aspirin  81 MG tablet Take 81 mg by mouth daily.   Blood Glucose Monitoring Suppl (ACCU-CHEK GUIDE) w/Device KIT AS DIRECTED TO CHECK BLOOD SUGARS FINGER STICKS ONCE A DAY DX E11.22   BREZTRI AEROSPHERE 160-9-4.8 MCG/ACT AERO Inhale 2 puffs into the lungs 2 (two) times daily.   donepezil  (ARICEPT ) 10 MG tablet Take 10 mg by mouth at bedtime.   furosemide  (LASIX ) 20 MG tablet Take 1 tablet (20 mg total) by mouth daily. NEED OV.   levothyroxine  (SYNTHROID , LEVOTHROID) 50 MCG tablet Take 50 mcg by mouth daily.    lisinopril -hydrochlorothiazide  (ZESTORETIC ) 20-25 MG tablet Take 1 tablet by mouth daily.   methocarbamol  (ROBAXIN ) 500 MG tablet Take 1 tablet (500 mg total) by mouth every 6 (six) hours as needed for muscle spasms.   rOPINIRole  (REQUIP ) 1 MG tablet Take 1 mg by mouth 2 (two) times daily.   rosuvastatin  (CRESTOR ) 20 MG tablet TAKE 1 TABLET BY MOUTH DAILY   sertraline  (ZOLOFT ) 100 MG tablet Take 100 mg by mouth daily.   TRELEGY ELLIPTA 100-62.5-25 MCG/INH AEPB Inhale 1 puff into the lungs every other day. As needed   valACYclovir (VALTREX)  1000 MG tablet Take 2,000 mg by mouth 2 (two) times daily as needed.   vitamin B-12 (CYANOCOBALAMIN) 1000 MCG tablet Take 1,000 mcg by mouth daily.   azithromycin  (ZITHROMAX ) 250 MG tablet Take 1 tablet (250 mg total) by mouth daily. Take first 2 tablets together, then 1 every day until finished. (Patient not taking: Reported on 11/21/2023)   No facility-administered encounter medications on file as of 11/21/2023.     Objective:     PHYSICAL EXAMINATION:    VITALS:   Vitals:   11/21/23 0831  BP: 116/80  Pulse: 97  Resp: 20  SpO2: 99%  Weight: 197 lb (89.4 kg)  Height: 5' 6 (1.676 m)    GEN:  The patient appears stated age and is in NAD. HEENT:  Normocephalic, atraumatic.   Neurological examination:  General: NAD, well-groomed, appears stated age. Orientation: The patient is alert. Oriented to person, place and date Cranial nerves: There is good facial symmetry.The speech is fluent and clear. No aphasia or dysarthria. Fund of knowledge is appropriate. Recent memory impaired and remote memory is normal.  Attention and concentration are normal.  Able to name objects and repeat phrases.  Hearing is intact to conversational tone  .   Delayed recall 3/3 Sensation: Sensation is intact to light touch throughout Motor: Strength is at least antigravity x4. DTR's 2/4 in UE/LE      06/13/2021    8:00 AM  Montreal Cognitive Assessment   Visuospatial/ Executive (0/5) 3  Naming (0/3) 3  Attention: Read list of digits (0/2) 2  Attention: Read list of letters (0/1) 1  Attention: Serial 7 subtraction starting at 100 (0/3) 0  Language: Repeat phrase (0/2) 1  Language : Fluency (0/1) 0  Abstraction (0/2) 1  Delayed Recall (0/5) 0  Orientation (0/6) 6  Total 17  Adjusted Score (based on education) 17       11/21/2023   11:00 AM 05/21/2023    5:00 PM 11/20/2022   10:00 AM  MMSE - Mini Mental State Exam  Orientation to time 5 5 5   Orientation to Place 5 5 5   Registration 3 3 3    Attention/ Calculation 5 5 5   Recall 3 3 3   Language- name 2 objects 2 2 2   Language- repeat 1 1 1   Language- follow 3 step command 3 3 3   Language- read & follow direction 1 1 1   Write a sentence 1 1 1   Copy design 1 0 1  Total score 30 29 30        Movement examination: Tone: There is normal tone in the UE/LE Abnormal movements:  no tremor at rest, very mild intention tremor when holding a bottle L>R .  No myoclonus.  No asterixis.No cogwheeling.    Coordination:  There is no decremation with RAM's. Normal finger to nose  Gait and Station: The patient has no difficulty arising out of a deep-seated chair without the use of the hands. The patient's stride length is good.  Gait is cautious and narrow.   Thank you for allowing us  the opportunity to participate in the care of this nice patient. Please do not hesitate to contact us  for any questions or concerns.   Total time spent on today's visit was 30 minutes dedicated to this patient today, preparing to see patient, examining the patient, ordering tests and/or medications and counseling the patient, documenting clinical information in the EHR or other health record, independently interpreting results and communicating results to the patient/family, discussing treatment and goals, answering patient's questions and coordinating care.  Cc:  Loreli Kins, MD  Camie Sevin 11/21/2023 11:03 AM

## 2023-11-26 DIAGNOSIS — J3089 Other allergic rhinitis: Secondary | ICD-10-CM | POA: Diagnosis not present

## 2023-11-26 DIAGNOSIS — J301 Allergic rhinitis due to pollen: Secondary | ICD-10-CM | POA: Diagnosis not present

## 2023-11-26 DIAGNOSIS — J3081 Allergic rhinitis due to animal (cat) (dog) hair and dander: Secondary | ICD-10-CM | POA: Diagnosis not present

## 2023-12-03 DIAGNOSIS — J301 Allergic rhinitis due to pollen: Secondary | ICD-10-CM | POA: Diagnosis not present

## 2023-12-03 DIAGNOSIS — J3081 Allergic rhinitis due to animal (cat) (dog) hair and dander: Secondary | ICD-10-CM | POA: Diagnosis not present

## 2023-12-03 DIAGNOSIS — J3089 Other allergic rhinitis: Secondary | ICD-10-CM | POA: Diagnosis not present

## 2023-12-04 DIAGNOSIS — M5116 Intervertebral disc disorders with radiculopathy, lumbar region: Secondary | ICD-10-CM | POA: Diagnosis not present

## 2023-12-04 DIAGNOSIS — M5416 Radiculopathy, lumbar region: Secondary | ICD-10-CM | POA: Diagnosis not present

## 2023-12-10 DIAGNOSIS — J3089 Other allergic rhinitis: Secondary | ICD-10-CM | POA: Diagnosis not present

## 2023-12-10 DIAGNOSIS — J3081 Allergic rhinitis due to animal (cat) (dog) hair and dander: Secondary | ICD-10-CM | POA: Diagnosis not present

## 2023-12-10 DIAGNOSIS — J301 Allergic rhinitis due to pollen: Secondary | ICD-10-CM | POA: Diagnosis not present

## 2023-12-16 DIAGNOSIS — J3089 Other allergic rhinitis: Secondary | ICD-10-CM | POA: Diagnosis not present

## 2023-12-16 DIAGNOSIS — J3081 Allergic rhinitis due to animal (cat) (dog) hair and dander: Secondary | ICD-10-CM | POA: Diagnosis not present

## 2023-12-16 DIAGNOSIS — J301 Allergic rhinitis due to pollen: Secondary | ICD-10-CM | POA: Diagnosis not present

## 2023-12-23 DIAGNOSIS — J301 Allergic rhinitis due to pollen: Secondary | ICD-10-CM | POA: Diagnosis not present

## 2023-12-23 DIAGNOSIS — J3081 Allergic rhinitis due to animal (cat) (dog) hair and dander: Secondary | ICD-10-CM | POA: Diagnosis not present

## 2023-12-23 DIAGNOSIS — J3089 Other allergic rhinitis: Secondary | ICD-10-CM | POA: Diagnosis not present

## 2023-12-31 DIAGNOSIS — J3081 Allergic rhinitis due to animal (cat) (dog) hair and dander: Secondary | ICD-10-CM | POA: Diagnosis not present

## 2023-12-31 DIAGNOSIS — J301 Allergic rhinitis due to pollen: Secondary | ICD-10-CM | POA: Diagnosis not present

## 2023-12-31 DIAGNOSIS — J3089 Other allergic rhinitis: Secondary | ICD-10-CM | POA: Diagnosis not present

## 2024-01-04 ENCOUNTER — Other Ambulatory Visit: Payer: Self-pay | Admitting: Cardiovascular Disease

## 2024-01-06 DIAGNOSIS — J3089 Other allergic rhinitis: Secondary | ICD-10-CM | POA: Diagnosis not present

## 2024-01-06 DIAGNOSIS — J301 Allergic rhinitis due to pollen: Secondary | ICD-10-CM | POA: Diagnosis not present

## 2024-01-06 DIAGNOSIS — J3081 Allergic rhinitis due to animal (cat) (dog) hair and dander: Secondary | ICD-10-CM | POA: Diagnosis not present

## 2024-01-09 ENCOUNTER — Ambulatory Visit: Payer: Medicare Other | Attending: Cardiovascular Disease | Admitting: Cardiovascular Disease

## 2024-01-09 ENCOUNTER — Encounter: Payer: Self-pay | Admitting: Cardiovascular Disease

## 2024-01-09 VITALS — BP 157/73 | HR 66 | Ht 66.0 in | Wt 198.6 lb

## 2024-01-09 DIAGNOSIS — I350 Nonrheumatic aortic (valve) stenosis: Secondary | ICD-10-CM | POA: Diagnosis not present

## 2024-01-09 DIAGNOSIS — I10 Essential (primary) hypertension: Secondary | ICD-10-CM | POA: Diagnosis not present

## 2024-01-09 DIAGNOSIS — E782 Mixed hyperlipidemia: Secondary | ICD-10-CM

## 2024-01-09 DIAGNOSIS — E039 Hypothyroidism, unspecified: Secondary | ICD-10-CM | POA: Diagnosis not present

## 2024-01-09 DIAGNOSIS — R7309 Other abnormal glucose: Secondary | ICD-10-CM | POA: Diagnosis not present

## 2024-01-09 DIAGNOSIS — J45909 Unspecified asthma, uncomplicated: Secondary | ICD-10-CM | POA: Diagnosis not present

## 2024-01-09 DIAGNOSIS — G4733 Obstructive sleep apnea (adult) (pediatric): Secondary | ICD-10-CM | POA: Diagnosis not present

## 2024-01-09 NOTE — Progress Notes (Signed)
 PCP: Dr. Lupita Raider Primary cardiologist: Dr. Royann Shivers; Dr. Tresa Endo (Sleep)   HPI: Charlotte [name] is a 73 y.o. female who presents for a 4 month follow-up evaluation.    Charlotte [name] has a history of asthma, hypertension, hyperlipidemia, hypothyroidism, and had been followed by Dr. Royann Shivers for cardiology care.  She was referred for a sleep study due to concerns of obstructive sleep apnea.  She underwent a split-night study on 04/01/2016 which demonstrated severe obstructive sleep apnea with an AHI of 32.7 per hour.  She had a significant positional component with supine sleep AHI at 42.4 per hour.  She was unable to achieve any REM sleep on the baseline portion of the study.  CPAP titration was initiated titrated up to 11 cm water pressure.  She had soft snoring.  There was a significantly abnormal arousal index area.  She was in sinus rhythm and had a rare PAC.  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  She was set up with CPAP on 05/03/2016. A download was obtained from 05/20/2016 through August 1 17.  She had only 27% of days of usage and only 17% of days greater than 4 hours of use.  She was only averaging 4 hours and 24 minutes.  On 11 cm water pressure, her AHI was 4.0, with an apnea index was 3.8 with a hypopnea index of 0.2.  Since she underwent shoulder surgery  she has been sleeping in a recliner since her surgery.  She has had pain in her shoulder which has been limiting her sleep.  When I saw her in August 2017 an Epworth sleepiness scale score was calculated  as shown below and this endorsed at 16 compatible with significant excessive daytime sleepiness.  Epworth Sleepiness Scale: Situation   Chance of Dozing/Sleeping (0 = never , 1 = slight chance , 2 = moderate chance , 3 = high chance )   sitting and reading 2   watching TV 2   sitting inactive in a public place 2   being a passenger in a motor vehicle for an hour or  more 2   lying down in the afternoon 2   sitting and talking to someone 2   sitting quietly after lunch (no alcohol) 2   while stopped for a few minutes in traffic as the driver 2   Total Score  16   She was evaluated by me in July 2019.  She had  been seen by Abelina Bachelor, PsyD for a neurology evaluation there has been some issues with cognitive functioning.  During that evaluation it was recommended that with complaints of poor sleep the fact that she had not been using her CPAP therapy at this could be exacerbating underlying dementia.  She had stopped CPAP for some time.  During that evaluation stated that she would try to reinstitute therapy.  Download was obtained 1 month later which did show improve use but she was still not meeting compliance standards with 73% of usage days and only 60% of usage greater than 4 hours.  AHI was 3.2.  She was set on a total device with a range of 7-20 with 95th percentile pressure 11.6.   I saw her June 2021 and since her prior evaluation is normal she needs she is now followed primarily by Dr. Cam Hai and has not seen Dr. Rubie Maid. She has had issues with increasing blood pressure and her  lisinopril HCT dose was recently increased 6 months ago to 20/25 mg.  She is continue to take furosemide 20 mg daily.  I obtained a new download of her CPAP from May 29 through May 14, 2020 which again shows poor compliance with only 6 out of 30 days of usage.  Her AHI however is excellent when used at 0.6.  She continues to be on levothyroxine 50 mcg for hypothyroidism.  She is on simvastatin 40 mg for hyperlipidemia.  LDL cholesterol in April 2021 was 101.  During that evaluation, abnormal discussion with her regarding regarding the effects of untreated sleep apnea and her cardiovascular health.  Her blood pressure remained elevated and amlodipine 5 mg was added to her medical regimen.  Her LDL remains elevated despite being on simvastatin I suggested she change  to rosuvastatin 20 mg a more aggressive in therapy.  When I saw her in June 12, 2021 she was doing well and was consistently using her CPAP therapy and admitted that she feels so much better.  She is unaware of any breakthrough snoring.  An Epworth Sleepiness Scale score was calculated in the office today and this endorsed at 7 arguing against residual daytime sleepiness.  Her CPAP machine set up date was May 03, 2016.  Her machine is 3G and not 5G compatible and as result we were unable to obtain any wireless download data after April of this year when did not work now only allows 5G compatibility.  However a download was obtained from March 1 through February 16, 2021 which confirms excellent compliance.  She is averaging 7 hours and 52 minutes of CPAP use per night. Her ResMed AirSense 10 AutoSet unit is set at a minimum pressure of 6 with potential maximum of 20 cm.  Her 95th percentile pressure is 10.6 with a maximum average pressure of 11.6.  AHI is excellent at 0.8.  Since I added amlodipine last year, her blood pressure has improved.  In addition with the change to rosuvastatin, lipid studies significantly improved with most recent LDL cholesterol at 64 when drawn by Dr. Cam Hai on Mar 23, 2021.  At that time, her machine was no longer wireless since it is not 5G compatible.  She qualified for new CPAP machine and I discussed with her due to supply chain issues this may take 6 months or more to obtain.  I saw her on Mar 20, 2022.  [name] received a new ResMed air sense 11 AutoSet unit on January 02, 2022.  An initial download was obtained from March 26 through March 11, 2022 and compliance is met with usage days at 77% and usage greater than 4 hours at 70%.  However average use was only 6 hours.  Her pressure setting range is from 6 to 20 cm and AHI was 0.6.  Recently, however, usage has slightly declined and on a download from April 4 through Mar 20, 2022 usage greater than 4 hours was 67%.   Average use was 6 hours.  She states this was mainly due to to allergies creating significant nasal stuffiness.  AHI was 0.6/h.    She underwent an echo Doppler study ordered by Dr. Royann Shivers on April 26, 2022 which showed normal systolic function without wall motion abnormality and with mild LVH.  There was mild aortic valve stenosis with a mean gradient of 11 mmHg.  I last saw her on September 09, 2023.  Since her prior evaluation with me, she had never established with a  new DME company after [facility] departed from the CPAP business.  She has needed new supplies and apparently has not been able to receive them.  As result of worn out tubing and mask, her CPAP use has declined.  Typically she goes to bed at 10 PM and wakes up at 6 AM.  Her download from August 21 through August 07, 2023 showed average use of CPAP at 5 hours and 30 minutes but she did not use therapy many days since she did not feel the equipment was adequate and usage was only 37%.  When used, AHI is 1.0.  95th percentile pressure was 11.8 with maximum average pressure at 13.0.  A subsequent download from September 20 to September 06, 2023  again confirms  further reduction in usage due to poor equipment but when used, AHI is excellent at 0.7.  Her CPAP PAP has been set at a pressure range of 6 to 20 cm and 95th percentile pressure was 15.1 with maximum average pressure at 16.9.  She has had worsening asthma and was concerned about using her CPAP with bronchospasm.  She is followed by Dr. Cam Hai for asthma.  During that evaluation, I again reviewed the importance of CPAP therapy both for her Cardiologic as well as her recent bronchospasm at sleep.  She was established with a new DME and obtain new supplies.  When seen by Dr. Royann Shivers on September 23, 2023, she had worsening glucose control and was felt down to be diabetic with hemoglobin A1c at 6.7.  An echo Doppler study on October 27, 2023 showed normal LV function with EF 55  to 60%, and mild concentric LVH.  There was mild elevation of the pulmonary artery systolic pressure estimated at 37.8.  There was mild aortic valve stenosis with a mean gradient at 18 and peak gradient 34.2 with estimated valve area 1.72 cm.  Presently, she states that she has been using CPAP on a daily basis.  However, we will need to contact her new DME so that we can obtain data from her machine.  The last data that I was able to access was from October 2024, but she states that she has been using it on a daily basis.  She admits to 100% use.  Her sleep has been well.  Typically she goes to bed between 9 and 10 PM and wakes up at 5 AM.  She may have nocturia 2 times per night.  She continues to have low back issues at L3/4 which is resulting in her not able to walk as well as she had in her past.  She presents for evaluation.   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 04/01/2017   Chest pain at rest 01/27/2016   Chronic diastolic heart failure 04/22/2016   Decreased estrogen level    Degeneration of lumbar intervertebral disc    Essential hypertension 01/27/2016   Exertional dyspnea 01/27/2016   GERD (gastroesophageal reflux disease)    HNP (herniated nucleus pulposus) with myelopathy, cervical 04/01/2017   Hypersomnolence 01/27/2016   Hypothyroidism 01/27/2016   Idiopathic scoliosis of lumbar spine    Impaired fasting glucose    Injury of superior glenoid labrum of shoulder joint 03/12/2018   Insomnia    Lumbar stenosis with neurogenic claudication 08/23/2021   Major depressive disorder  MCI (mild cognitive impairment) 03/05/2018   Mixed hyperlipidemia 01/27/2016   Murmur, cardiac 01/27/2016   Neuropathy    Non-toxic goiter    OSA (obstructive sleep apnea) 04/22/2016   nightly CPAP use   Osteoarthritis    Pain in joint of left shoulder  03/12/2018   Pericardial effusion 01/27/2016   PONV (postoperative nausea and vomiting)    Pre-diabetes    Restless legs    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   Shortness of breath dyspnea    relating to asthma   Syncope 01/27/2016    Past Surgical History:  Procedure Laterality Date   ABDOMINAL HYSTERECTOMY     BACK SURGERY  08/23/2021   BREAST REDUCTION SURGERY  2011   CERVICAL DISC ARTHROPLASTY N/A 04/01/2017   Procedure: CERVICAL ANTERIOR DISC ARTHROPLASTY C5-C6;  Surgeon: Barnett Abu, MD;  Location: [facility] OR;  Service: Neurosurgery;  Laterality: N/A;   COLONOSCOPY     ESOPHAGOGASTRODUODENOSCOPY     EYE SURGERY     bilateral cataracts   JOINT REPLACEMENT     right shoulder replacement   REDUCTION MAMMAPLASTY     REVERSE SHOULDER ARTHROPLASTY Right 08/02/2016   Procedure: REVERSE SHOULDER ARTHROPLASTY;  Surgeon: Beverely Low, MD;  Location: [facility] OR;  Service: Orthopedics;  Laterality: Right;    Allergies  Allergen Reactions   Desvenlafaxine Succinate Er Other (See Comments)     Current Outpatient Medications  Medication Sig Dispense Refill   ACCU-CHEK GUIDE test strip 1 each 2 (two) times a week.     Accu-Chek Softclix Lancets lancets SMARTSIG:Topical Daily on Weekdays     albuterol (PROVENTIL HFA;VENTOLIN HFA) 108 (90 Base) MCG/ACT inhaler Inhale 2 puffs into the lungs every 6 (six) hours as needed for wheezing or shortness of breath.     amLODipine (NORVASC) 5 MG tablet TAKE 1 TABLET BY MOUTH DAILY 90 tablet 0   aspirin 81 MG tablet Take 81 mg by mouth daily.     azithromycin (ZITHROMAX) 250 MG tablet Take 1 tablet (250 mg total) by mouth daily. Take first 2 tablets together, then 1 every day until finished. 6 tablet 0   Blood Glucose Monitoring Suppl (ACCU-CHEK GUIDE) w/Device KIT AS DIRECTED TO CHECK BLOOD SUGARS FINGER STICKS ONCE A DAY DX E11.22     BREZTRI AEROSPHERE 160-9-4.8 MCG/ACT AERO Inhale 2 puffs into the lungs 2 (two) times daily.     donepezil (ARICEPT)  10 MG tablet Take 10 mg by mouth at bedtime.     furosemide (LASIX) 20 MG tablet Take 1 tablet (20 mg total) by mouth daily. NEED OV. 30 tablet 0   levothyroxine (SYNTHROID, LEVOTHROID) 50 MCG tablet Take 50 mcg by mouth daily.      lisinopril-hydrochlorothiazide (ZESTORETIC) 20-25 MG tablet Take 1 tablet by mouth daily.     methocarbamol (ROBAXIN) 500 MG tablet Take 1 tablet (500 mg total) by mouth every 6 (six) hours as needed for muscle spasms. 40 tablet 3   rOPINIRole (REQUIP) 1 MG tablet Take 1 mg by mouth 2 (two) times daily.     rosuvastatin (CRESTOR) 20 MG tablet TAKE 1 TABLET BY MOUTH DAILY 100 tablet 0   sertraline (ZOLOFT) 100 MG tablet Take 100 mg by mouth daily.     TRELEGY ELLIPTA 100-62.5-25 MCG/INH AEPB Inhale 1 puff into the lungs every other day. As needed     valACYclovir (VALTREX) 1000 MG tablet Take 2,000 mg by mouth 2 (two) times daily as needed.     vitamin B-12 (CYANOCOBALAMIN) 1000 MCG  tablet Take 1,000 mcg by mouth daily.     No current facility-administered medications for this visit.    Social History   Socioeconomic History   Marital status: Widowed    Spouse name: Not on file   Number of children: Not on file   Years of education: 9   Highest education level: 9th grade  Occupational History   Occupation: Retired    Comment: OR attendant  Tobacco Use   Smoking status: Never   Smokeless tobacco: Never  Vaping Use   Vaping status: Never Used  Substance and Sexual Activity   Alcohol use: No    Alcohol/week: 0.0 standard drinks of alcohol   Drug use: No   Sexual activity: Not on file  Other Topics Concern   Not on file  Social History Narrative   Epworth Sleepiness Scale = 17 (as of 02/16/2016)   Right handed   Drinks caffeine   Lives alone   One story home   Social Drivers of Health   Financial Resource Strain: Not on file  Food Insecurity: No Food Insecurity (12/18/2022)   Hunger Vital Sign    Worried About Running Out of Food in the Last  Year: Never true    Ran Out of Food in the Last Year: Never true  Transportation Needs: No Transportation Needs (12/18/2022)   PRAPARE - Administrator, Civil Service (Medical): No    Lack of Transportation (Non-Medical): No  Physical Activity: Sufficiently Active (12/18/2022)   Exercise Vital Sign    Days of Exercise per Week: 4 days    Minutes of Exercise per Session: 40 min  Recent Concern: Physical Activity - Insufficiently Active (12/18/2022)   Exercise Vital Sign    Days of Exercise per Week: 4 days    Minutes of Exercise per Session: 20 min  Stress: Not on file  Social Connections: Not on file  Intimate Partner Violence: Not on file    Family History  Problem Relation Age of Onset   Heart failure Mother    Diabetes Mother    Hypertension Mother    Hyperlipidemia Mother    Lung cancer Father    Diabetes Father    Hypertension Father    Hypertension Sister    Hyperlipidemia Sister    Hyperthyroidism Sister    Lung cancer Brother    Rheum arthritis Child    Lung cancer Child    Dementia Maternal Grandmother    Heart disease Maternal Grandfather    Lung cancer Paternal Grandfather    Diabetes Brother    Hyperlipidemia Brother    Hyperlipidemia Brother    Dementia Maternal Aunt      ROS General: Negative; No fevers, chills, or night sweats HEENT: Negative; No changes in vision or hearing, sinus congestion, difficulty swallowing Pulmonary: Positive for asthma Cardiovascular: Mild aortic stenosis GI: Negative; No nausea, vomiting, diarrhea, or abdominal pain GU: Negative; No dysuria, hematuria, or difficulty voiding Musculoskeletal: Low back discomfort at L4 and 5 Hematologic: Negative; no easy bruising, bleeding Endocrine: Positive for hypothyroidism. Neuro: Intermittent sciatica down her left leg Skin: Negative; No rashes or skin lesions Psychiatric: Negative; No behavioral problems, depression Sleep: Positive for OSA.  On CPAP with reduced  compliance.  Need to establish with a new DME company and obtain supplies positive for daytime sleepiness, and hypersomnolence; no bruxism, restless legs, hypnogognic hallucinations, no cataplexy   Physical Exam BP (!) 157/73   Pulse 66   Ht 5\' 6"  (1.676 m)  Wt 198 lb 9.6 oz (90.1 kg)   SpO2 95%   BMI 32.05 kg/m    Repeat blood pressure by me was 145/78   Wt Readings from Last 3 Encounters:  01/09/24 198 lb 9.6 oz (90.1 kg)  11/21/23 197 lb (89.4 kg)  09/23/23 194 lb 3.2 oz (88.1 kg)   General: Alert, oriented, no distress.  Skin: normal turgor, no rashes, warm and dry HEENT: Normocephalic, atraumatic. Pupils equal round and reactive to light; sclera anicteric; extraocular muscles intact;  Nose without nasal septal hypertrophy Mouth/Parynx benign; Mallinpatti scale 3 Neck: No JVD, no carotid bruits; normal carotid upstroke Lungs: clear to ausculatation and percussion; no wheezing or rales Chest wall: without tenderness to palpitation Heart: PMI not displaced, RRR, s1 s2 normal, 1/6 systolic murmur, no diastolic murmur, no rubs, gallops, thrills, or heaves Abdomen: soft, nontender; no hepatosplenomehaly, BS+; abdominal aorta nontender and not dilated by palpation. Back: no CVA tenderness Pulses 2+ Musculoskeletal: full range of motion, normal strength, no joint deformities Extremities: no clubbing cyanosis or edema, Homan's sign negative  Neurologic: grossly nonfocal; Cranial nerves grossly wnl Psychologic: Normal mood and affect   EKG Interpretation Date/Time:  Friday January 09 2024 10:15:34 EST Ventricular Rate:  66 PR Interval:  142 QRS Duration:  68 QT Interval:  398 QTC Calculation: 417 R Axis:   93  Text Interpretation: Normal sinus rhythm Rightward axis Nonspecific ST abnormality When compared with ECG of 23-Sep-2023 08:36, ST no longer elevated in Inferior leads Confirmed by Nicki Guadalajara ([id number]) on 01/09/2024 10:31:03 AM      Mar 20, 2022 ECG (independently  read by me): Sinus bradycardia at 58  June 12, 2021 ECG (independently read by me): Sinus bradycardia 58 bpm with isolated PAC.  No ST segment changes.  Normal intervals.  June 2021 ECG (independently read by me): Sinus bradycardia 59 bpm.  No ectopy.  Normal intervals.  July 2019 ECG (independently read by me): Normal sinus rhythm at 64 bpm.  Baseline wander.  No ectopy.  Normal intervals.  ECG from 04/22/2016 was reviewed which showed sinus rhythm with inferolateral ST changes  LABS:     Latest Ref Rng & Units 08/24/2021    1:46 AM 08/20/2021    8:37 AM 04/01/2017    2:35 PM  BMP  Glucose 70 - 99 mg/dL 540  981  96   BUN 8 - 23 mg/dL 23  25  14    Creatinine 0.44 - 1.00 mg/dL 1.91  4.78  2.95   Sodium 135 - 145 mmol/L 139  139  140   Potassium 3.5 - 5.1 mmol/L 2.9  3.3  3.7   Chloride 98 - 111 mmol/L 104  102  108   CO2 22 - 32 mmol/L 25  27  25    Calcium 8.9 - 10.3 mg/dL 8.5  9.4  9.3          No data to display              Latest Ref Rng & Units 08/25/2021    4:03 AM 08/24/2021    1:46 AM 08/20/2021    8:37 AM  CBC  WBC 4.0 - 10.5 K/uL  7.7  7.8   Hemoglobin 12.0 - 15.0 g/dL 62.1  8.5  30.8   Hematocrit 36.0 - 46.0 % 33.6  25.0  36.1   Platelets 150 - 400 K/uL  116  161      Lipid Panel  No results found for: "CHOL", "TRIG", "HDL", "CHOLHDL", "  VLDL", "LDLCALC", "LDLDIRECT"   RADIOLOGY: No results found.  IMPRESSION: 1. OSA (obstructive sleep apnea) on CPAP   2. Essential hypertension   3. Mild aortic stenosis   4. Mild asthma without complication, unspecified whether persistent   5. Elevated hemoglobin A1c   6. Acquired hypothyroidism     ASSESSMENT AND PLAN: Ms. Makhayla Mcmurry is a 73 year-old female who has a history of hypertension, asthma, hypothyroidism, hyperlipidemia, glucose elevation and was found to have severe obstructive sleep apnea on her sleep study from May 2017.  On the diagnostic portion of the study, her AHI was 32.7 per hour but her  RDI was significantly increased at 67.8 per hour.  She was unable to achieve any REM sleep.  She was titrated up to 11 cm water pressure with excellent benefit.  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  When I saw her in 2019 she had not used CPAP for several years.  At that time I had lengthy discussion with her and she did initially reinitiate therapy with subsequent download 1 month later.  When seen in June 2021 her compliance again had become poor and during that time had an extensive discussion with her regarding the potential adverse cardiovascular consequences of untreated sleep apnea with particular reference to its effect on blood pressure control, potential for nocturnal arrhythmias, glucose as well as increased inflammation, GERD, and potential nocturnal hypoxemia contributing to ischemia.  She subsequently improved compliance.  Unfortunately, her DME company which she had received supplies from was no longer providing CPAP care.  She was supposed to be transition to another DME company and this may have occurred at the time of our sleep coordinator retired and there was several months of a new sleep coordinator taking over.  She was subsequently seen by me in October 2024.  Since that time she has connected with a new DME company.  We will need to get this data since I am unable to access this from a download today in our office.  The patient states that she is using CPAP with 100% compliance.  I again stressed the importance of ideally using therapy for a minimum of 7 hours.  Typically she goes to bed between 9 and 10 PM and often wakes up at 5 AM.  She is sleeping better with resumption of therapy.  I will await her download and then potentially make adjustments to her current settings which at present is a start ramp pressure of 4 which I will increase to 6.  She is on AutoSet at a range of 6 to 20 cm with EPR of 3 and I may very well increase her initial  start pressure depending upon her download.  She has mild aortic stenosis as verified on her most recent echo.  She is adjusting her diet in light of recent hemoglobin A1c elevation.  She will follow-up with Dr. Royann Shivers for her cardiology care.  I discussed with her my plans for retirement this year and in the future if sleep issues arise she will need to be followed by Dr. Mayford Knife or another sleep provider.   Lennette Bihari, MD, [facility], ABSM Diplomate, American Board of Sleep Medicine   01/11/2024 1:49 PM

## 2024-01-09 NOTE — Patient Instructions (Addendum)
 Medication Instructions:  No medication changes were made during today's visit. *If you need a refill on your cardiac medications before your next appointment, please call your pharmacy*   Lab Work: No labs were ordered during today's visit.  If you have labs (blood work) drawn today and your tests are completely normal, you will receive your results only by: MyChart Message (if you have MyChart) OR A paper copy in the mail If you have any lab test that is abnormal or we need to change your treatment, we will call you to review the results.   Testing/Procedures: No procedures ordered today.    Follow-Up: At [facility], you and your health needs are our priority.  As part of our continuing mission to provide you with exceptional heart care, we have created designated Provider Care Teams.  These Care Teams include your primary Cardiologist (physician) and Advanced Practice Providers (APPs -  Physician Assistants and Nurse Practitioners) who all work together to provide you with the care you need, when you need it.  We recommend signing up for the patient portal called "MyChart".  Sign up information is provided on this After Visit Summary.  MyChart is used to connect with patients for Virtual Visits (Telemedicine).  Patients are able to view lab/test results, encounter notes, upcoming appointments, etc.  Non-urgent messages can be sent to your provider as well.   To learn more about what you can do with MyChart, go to [email or web address].    Your next appointment:   As needed for sleep care concerns     Provider:   Dr. Armanda Magic    If you have any questions or concerns regarding your c-pap, bi-pap or sleep accessories, please contact Brandie Rorie at [phone].           1st Floor: - Lobby - Registration  - Pharmacy  - Lab - Cafe   2nd Floor: - PV Lab - Diagnostic Testing (echo, CT, nuclear med)   3rd Floor: - Vacant   4th Floor: - TCTS  (cardiothoracic surgery) - AFib Clinic - Structural Heart Clinic - Vascular Surgery  - Vascular Ultrasound   5th Floor: - [facility] (general and EP) - Clinical Pharmacy for coumadin, hypertension, lipid, weight-loss medications, and med management appointments      Valet parking services will be available as well.

## 2024-01-11 ENCOUNTER — Encounter: Payer: Self-pay | Admitting: Cardiovascular Disease

## 2024-01-14 DIAGNOSIS — J3089 Other allergic rhinitis: Secondary | ICD-10-CM | POA: Diagnosis not present

## 2024-01-14 DIAGNOSIS — J4550 Severe persistent asthma, uncomplicated: Secondary | ICD-10-CM | POA: Diagnosis not present

## 2024-01-14 DIAGNOSIS — J301 Allergic rhinitis due to pollen: Secondary | ICD-10-CM | POA: Diagnosis not present

## 2024-01-14 DIAGNOSIS — J3081 Allergic rhinitis due to animal (cat) (dog) hair and dander: Secondary | ICD-10-CM | POA: Diagnosis not present

## 2024-01-20 DIAGNOSIS — J301 Allergic rhinitis due to pollen: Secondary | ICD-10-CM | POA: Diagnosis not present

## 2024-01-20 DIAGNOSIS — J3081 Allergic rhinitis due to animal (cat) (dog) hair and dander: Secondary | ICD-10-CM | POA: Diagnosis not present

## 2024-01-20 DIAGNOSIS — J3089 Other allergic rhinitis: Secondary | ICD-10-CM | POA: Diagnosis not present

## 2024-01-27 DIAGNOSIS — J3081 Allergic rhinitis due to animal (cat) (dog) hair and dander: Secondary | ICD-10-CM | POA: Diagnosis not present

## 2024-01-27 DIAGNOSIS — J3089 Other allergic rhinitis: Secondary | ICD-10-CM | POA: Diagnosis not present

## 2024-01-27 DIAGNOSIS — J301 Allergic rhinitis due to pollen: Secondary | ICD-10-CM | POA: Diagnosis not present

## 2024-01-30 DIAGNOSIS — Z09 Encounter for follow-up examination after completed treatment for conditions other than malignant neoplasm: Secondary | ICD-10-CM | POA: Diagnosis not present

## 2024-01-30 DIAGNOSIS — D175 Benign lipomatous neoplasm of intra-abdominal organs: Secondary | ICD-10-CM | POA: Diagnosis not present

## 2024-01-30 DIAGNOSIS — Z860100 Personal history of colon polyps, unspecified: Secondary | ICD-10-CM | POA: Diagnosis not present

## 2024-01-30 DIAGNOSIS — D123 Benign neoplasm of transverse colon: Secondary | ICD-10-CM | POA: Diagnosis not present

## 2024-01-30 DIAGNOSIS — K635 Polyp of colon: Secondary | ICD-10-CM | POA: Diagnosis not present

## 2024-02-03 DIAGNOSIS — J3081 Allergic rhinitis due to animal (cat) (dog) hair and dander: Secondary | ICD-10-CM | POA: Diagnosis not present

## 2024-02-03 DIAGNOSIS — J3089 Other allergic rhinitis: Secondary | ICD-10-CM | POA: Diagnosis not present

## 2024-02-03 DIAGNOSIS — K635 Polyp of colon: Secondary | ICD-10-CM | POA: Diagnosis not present

## 2024-02-03 DIAGNOSIS — J301 Allergic rhinitis due to pollen: Secondary | ICD-10-CM | POA: Diagnosis not present

## 2024-02-03 DIAGNOSIS — D123 Benign neoplasm of transverse colon: Secondary | ICD-10-CM | POA: Diagnosis not present

## 2024-02-11 DIAGNOSIS — J3081 Allergic rhinitis due to animal (cat) (dog) hair and dander: Secondary | ICD-10-CM | POA: Diagnosis not present

## 2024-02-11 DIAGNOSIS — J3089 Other allergic rhinitis: Secondary | ICD-10-CM | POA: Diagnosis not present

## 2024-02-11 DIAGNOSIS — J301 Allergic rhinitis due to pollen: Secondary | ICD-10-CM | POA: Diagnosis not present

## 2024-02-18 DIAGNOSIS — J3089 Other allergic rhinitis: Secondary | ICD-10-CM | POA: Diagnosis not present

## 2024-02-18 DIAGNOSIS — J301 Allergic rhinitis due to pollen: Secondary | ICD-10-CM | POA: Diagnosis not present

## 2024-02-18 DIAGNOSIS — J3081 Allergic rhinitis due to animal (cat) (dog) hair and dander: Secondary | ICD-10-CM | POA: Diagnosis not present

## 2024-02-25 DIAGNOSIS — J3089 Other allergic rhinitis: Secondary | ICD-10-CM | POA: Diagnosis not present

## 2024-02-25 DIAGNOSIS — J301 Allergic rhinitis due to pollen: Secondary | ICD-10-CM | POA: Diagnosis not present

## 2024-02-25 DIAGNOSIS — J3081 Allergic rhinitis due to animal (cat) (dog) hair and dander: Secondary | ICD-10-CM | POA: Diagnosis not present

## 2024-03-02 DIAGNOSIS — J3081 Allergic rhinitis due to animal (cat) (dog) hair and dander: Secondary | ICD-10-CM | POA: Diagnosis not present

## 2024-03-02 DIAGNOSIS — J301 Allergic rhinitis due to pollen: Secondary | ICD-10-CM | POA: Diagnosis not present

## 2024-03-02 DIAGNOSIS — J3089 Other allergic rhinitis: Secondary | ICD-10-CM | POA: Diagnosis not present

## 2024-03-10 DIAGNOSIS — J3081 Allergic rhinitis due to animal (cat) (dog) hair and dander: Secondary | ICD-10-CM | POA: Diagnosis not present

## 2024-03-10 DIAGNOSIS — J3089 Other allergic rhinitis: Secondary | ICD-10-CM | POA: Diagnosis not present

## 2024-03-10 DIAGNOSIS — J301 Allergic rhinitis due to pollen: Secondary | ICD-10-CM | POA: Diagnosis not present

## 2024-03-14 ENCOUNTER — Other Ambulatory Visit: Payer: Self-pay | Admitting: Cardiovascular Disease

## 2024-03-15 ENCOUNTER — Other Ambulatory Visit: Payer: Self-pay | Admitting: Family Medicine

## 2024-03-15 DIAGNOSIS — Z1231 Encounter for screening mammogram for malignant neoplasm of breast: Secondary | ICD-10-CM

## 2024-03-16 DIAGNOSIS — J301 Allergic rhinitis due to pollen: Secondary | ICD-10-CM | POA: Diagnosis not present

## 2024-03-16 DIAGNOSIS — J3089 Other allergic rhinitis: Secondary | ICD-10-CM | POA: Diagnosis not present

## 2024-03-16 DIAGNOSIS — J3081 Allergic rhinitis due to animal (cat) (dog) hair and dander: Secondary | ICD-10-CM | POA: Diagnosis not present

## 2024-03-16 DIAGNOSIS — J4550 Severe persistent asthma, uncomplicated: Secondary | ICD-10-CM | POA: Diagnosis not present

## 2024-03-17 DIAGNOSIS — M79606 Pain in leg, unspecified: Secondary | ICD-10-CM | POA: Diagnosis not present

## 2024-03-22 DIAGNOSIS — J3089 Other allergic rhinitis: Secondary | ICD-10-CM | POA: Diagnosis not present

## 2024-03-22 DIAGNOSIS — J301 Allergic rhinitis due to pollen: Secondary | ICD-10-CM | POA: Diagnosis not present

## 2024-03-22 DIAGNOSIS — J3081 Allergic rhinitis due to animal (cat) (dog) hair and dander: Secondary | ICD-10-CM | POA: Diagnosis not present

## 2024-03-30 DIAGNOSIS — J301 Allergic rhinitis due to pollen: Secondary | ICD-10-CM | POA: Diagnosis not present

## 2024-03-30 DIAGNOSIS — J3081 Allergic rhinitis due to animal (cat) (dog) hair and dander: Secondary | ICD-10-CM | POA: Diagnosis not present

## 2024-03-30 DIAGNOSIS — J3089 Other allergic rhinitis: Secondary | ICD-10-CM | POA: Diagnosis not present

## 2024-04-06 DIAGNOSIS — J3089 Other allergic rhinitis: Secondary | ICD-10-CM | POA: Diagnosis not present

## 2024-04-06 DIAGNOSIS — J301 Allergic rhinitis due to pollen: Secondary | ICD-10-CM | POA: Diagnosis not present

## 2024-04-06 DIAGNOSIS — J3081 Allergic rhinitis due to animal (cat) (dog) hair and dander: Secondary | ICD-10-CM | POA: Diagnosis not present

## 2024-04-20 ENCOUNTER — Ambulatory Visit
Admission: RE | Admit: 2024-04-20 | Discharge: 2024-04-20 | Disposition: A | Discharge status: Home / Self Care | Source: Ambulatory Visit | Attending: Family Medicine | Admitting: Family Medicine

## 2024-04-20 DIAGNOSIS — Z1231 Encounter for screening mammogram for malignant neoplasm of breast: Secondary | ICD-10-CM | POA: Diagnosis not present

## 2024-04-20 DIAGNOSIS — J3089 Other allergic rhinitis: Secondary | ICD-10-CM | POA: Diagnosis not present

## 2024-04-20 DIAGNOSIS — J3081 Allergic rhinitis due to animal (cat) (dog) hair and dander: Secondary | ICD-10-CM | POA: Diagnosis not present

## 2024-04-20 DIAGNOSIS — J301 Allergic rhinitis due to pollen: Secondary | ICD-10-CM | POA: Diagnosis not present

## 2024-04-21 DIAGNOSIS — J45909 Unspecified asthma, uncomplicated: Secondary | ICD-10-CM | POA: Diagnosis not present

## 2024-04-21 DIAGNOSIS — E782 Mixed hyperlipidemia: Secondary | ICD-10-CM | POA: Diagnosis not present

## 2024-04-21 DIAGNOSIS — Z23 Encounter for immunization: Secondary | ICD-10-CM | POA: Diagnosis not present

## 2024-04-21 DIAGNOSIS — I110 Hypertensive heart disease with heart failure: Secondary | ICD-10-CM | POA: Diagnosis not present

## 2024-04-21 DIAGNOSIS — I5030 Unspecified diastolic (congestive) heart failure: Secondary | ICD-10-CM | POA: Diagnosis not present

## 2024-04-21 DIAGNOSIS — E039 Hypothyroidism, unspecified: Secondary | ICD-10-CM | POA: Diagnosis not present

## 2024-04-21 DIAGNOSIS — E1122 Type 2 diabetes mellitus with diabetic chronic kidney disease: Secondary | ICD-10-CM | POA: Diagnosis not present

## 2024-04-21 DIAGNOSIS — N183 Chronic kidney disease, stage 3 unspecified: Secondary | ICD-10-CM | POA: Diagnosis not present

## 2024-04-23 ENCOUNTER — Ambulatory Visit: Payer: Self-pay | Admitting: Cardiovascular Disease

## 2024-04-27 DIAGNOSIS — J3081 Allergic rhinitis due to animal (cat) (dog) hair and dander: Secondary | ICD-10-CM | POA: Diagnosis not present

## 2024-04-27 DIAGNOSIS — J3089 Other allergic rhinitis: Secondary | ICD-10-CM | POA: Diagnosis not present

## 2024-04-27 DIAGNOSIS — J301 Allergic rhinitis due to pollen: Secondary | ICD-10-CM | POA: Diagnosis not present

## 2024-04-27 NOTE — Telephone Encounter (Signed)
 Charlotte Rumple, MD 04/23/2024 11:43 AM EDT     Cholesterol numbers were great last December, but now the LDL cholesterol is much higher at 130 and I suspect that Charlotte [name] is no longer taking the rosuvastatin .  Due to the diagnosis of diabetes, target LDL is less than 100 and preferably less than 70, since she also has aortic stenosis which often is associated with atherosclerosis.   Was the medication stopped on purpose for side effects or inadvertently?    Called pt and went over the information above- she reports that her legs were hurting pretty bad, so Dr Bernetta Brilliant (PCP) took her off of it for a while (it was supposed to be just 3 weeks, but it ended up being 7 months). She reports that she started taking it 3 days ago. She has a follow up appt with her PCP in 3 months to recheck. Asked her to give us  an update and/or let us  know if she starts hurting again. She said she would.

## 2024-05-04 DIAGNOSIS — M5116 Intervertebral disc disorders with radiculopathy, lumbar region: Secondary | ICD-10-CM | POA: Diagnosis not present

## 2024-05-04 DIAGNOSIS — M5416 Radiculopathy, lumbar region: Secondary | ICD-10-CM | POA: Diagnosis not present

## 2024-05-11 ENCOUNTER — Ambulatory Visit: Admitting: Cardiology

## 2024-05-11 DIAGNOSIS — J3089 Other allergic rhinitis: Secondary | ICD-10-CM | POA: Diagnosis not present

## 2024-05-11 DIAGNOSIS — J301 Allergic rhinitis due to pollen: Secondary | ICD-10-CM | POA: Diagnosis not present

## 2024-05-11 DIAGNOSIS — J3081 Allergic rhinitis due to animal (cat) (dog) hair and dander: Secondary | ICD-10-CM | POA: Diagnosis not present

## 2024-05-18 DIAGNOSIS — J3089 Other allergic rhinitis: Secondary | ICD-10-CM | POA: Diagnosis not present

## 2024-05-18 DIAGNOSIS — J301 Allergic rhinitis due to pollen: Secondary | ICD-10-CM | POA: Diagnosis not present

## 2024-05-18 DIAGNOSIS — J3081 Allergic rhinitis due to animal (cat) (dog) hair and dander: Secondary | ICD-10-CM | POA: Diagnosis not present

## 2024-05-26 DIAGNOSIS — J301 Allergic rhinitis due to pollen: Secondary | ICD-10-CM | POA: Diagnosis not present

## 2024-05-26 DIAGNOSIS — J3081 Allergic rhinitis due to animal (cat) (dog) hair and dander: Secondary | ICD-10-CM | POA: Diagnosis not present

## 2024-05-26 DIAGNOSIS — J3089 Other allergic rhinitis: Secondary | ICD-10-CM | POA: Diagnosis not present

## 2024-06-01 DIAGNOSIS — J301 Allergic rhinitis due to pollen: Secondary | ICD-10-CM | POA: Diagnosis not present

## 2024-06-01 DIAGNOSIS — J3081 Allergic rhinitis due to animal (cat) (dog) hair and dander: Secondary | ICD-10-CM | POA: Diagnosis not present

## 2024-06-01 DIAGNOSIS — J3089 Other allergic rhinitis: Secondary | ICD-10-CM | POA: Diagnosis not present

## 2024-06-08 DIAGNOSIS — J3081 Allergic rhinitis due to animal (cat) (dog) hair and dander: Secondary | ICD-10-CM | POA: Diagnosis not present

## 2024-06-08 DIAGNOSIS — J3089 Other allergic rhinitis: Secondary | ICD-10-CM | POA: Diagnosis not present

## 2024-06-08 DIAGNOSIS — J301 Allergic rhinitis due to pollen: Secondary | ICD-10-CM | POA: Diagnosis not present

## 2024-06-21 ENCOUNTER — Ambulatory Visit: Payer: Medicare Other | Admitting: Physician Assistant

## 2024-06-22 DIAGNOSIS — J3081 Allergic rhinitis due to animal (cat) (dog) hair and dander: Secondary | ICD-10-CM | POA: Diagnosis not present

## 2024-06-22 DIAGNOSIS — J301 Allergic rhinitis due to pollen: Secondary | ICD-10-CM | POA: Diagnosis not present

## 2024-06-22 DIAGNOSIS — J3089 Other allergic rhinitis: Secondary | ICD-10-CM | POA: Diagnosis not present

## 2024-06-25 ENCOUNTER — Ambulatory Visit: Admitting: Cardiology

## 2024-06-30 DIAGNOSIS — J301 Allergic rhinitis due to pollen: Secondary | ICD-10-CM | POA: Diagnosis not present

## 2024-06-30 DIAGNOSIS — J3089 Other allergic rhinitis: Secondary | ICD-10-CM | POA: Diagnosis not present

## 2024-06-30 DIAGNOSIS — J3081 Allergic rhinitis due to animal (cat) (dog) hair and dander: Secondary | ICD-10-CM | POA: Diagnosis not present

## 2024-07-02 ENCOUNTER — Ambulatory Visit (INDEPENDENT_AMBULATORY_CARE_PROVIDER_SITE_OTHER): Payer: Medicare Other | Admitting: Psychology

## 2024-07-02 ENCOUNTER — Encounter: Payer: Self-pay | Admitting: Psychology

## 2024-07-02 ENCOUNTER — Ambulatory Visit: Payer: Self-pay | Admitting: Psychology

## 2024-07-02 DIAGNOSIS — R4189 Other symptoms and signs involving cognitive functions and awareness: Secondary | ICD-10-CM

## 2024-07-02 DIAGNOSIS — F0670 Mild neurocognitive disorder due to known physiological condition without behavioral disturbance: Secondary | ICD-10-CM

## 2024-07-02 DIAGNOSIS — N183 Chronic kidney disease, stage 3 unspecified: Secondary | ICD-10-CM | POA: Insufficient documentation

## 2024-07-02 NOTE — Progress Notes (Signed)
 NEUROPSYCHOLOGICAL EVALUATION [place]. [facility] [place] Department of Neurology  Date of Evaluation: July 02, 2024  Reason for Referral:   Leeya Rusconi is a 73 y.o. right-handed Caucasian female referred by Camie Sevin, PA-C, to characterize her current cognitive functioning and assist with diagnostic clarity and treatment planning in the context of a previously diagnosed mild neurocognitive disorder and concern for progressive cognitive decline.   Assessment and Plan:   Clinical Impression(s): Ms. Caridi pattern of performance is suggestive of primary deficits surrounding expressive language and encoding (i.e., learning) aspects of memory. Further weakness/variability was exhibited across processing speed, executive functioning, and both delayed retrieval and recognition aspects of memory. Performances were appropriate relative to age-matched peers across attention/concentration, receptive language, and visuospatial abilities. Functionally, Ms. Pharris denied difficulties completing instrumental activities of daily living (ADLs) independently. There was no collateral source of information to confirm or refute this reporting. 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  Relative to her previous evaluation in December 2022, isolated decline was exhibited across a visuomotor sequencing task (TMT B) assessing cognitive flexibility, clock drawing, and confrontation naming. However, other tasks assessing executive functioning, visuospatial abilities, and expressive language were relatively stable. Despite still being impaired overall, verbal fluency did see some mild improvements. Mild decline could be argued across delayed retrieval aspects of a figure drawing task. However, memory as a whole, largely exhibited  stability.   The cause for ongoing cognitive dysfunction remains uncertain. Previous evaluations have suggested the plausibility of an underlying neurodegenerative illness such as Alzheimer's disease. Ms. Partch does exhibit patterns across testing that could be concerning for this illness, namely 0% retention across a story-based memory task, poorer performances across recognition-based memory tasks, and deficits with expressive language. However, she did retain list and figure-based information reasonably well across the current evaluation. Also, since 2019, testing has been largely stable over time, exhibiting very mild decline across a few isolated areas. While this illness cannot be ruled out and simply progressing in a very slow manner, it cannot be ruled in with confidence based solely on testing performances.  Ms. Lasky denied behavioral features worrisome for Lewy body disease, another more rare parkinsonian condition, or frontotemporal lobar degeneration. Her most recent brain MRI (2022) suggested minimal microvascular ischemic disease, making a primary vascular etiology quite unlikely (absent of a more recent event which has occurred between this scan and current testing). There remains the possibility that current testing patterns are related to longstanding baseline intellectual abilities and the normal aging process, exacerbated by various chronic medical ailments, sleep disturbances, and essential tremor concerns. Continued medical monitoring will be important moving forward.   Recommendations: Should there be report of cognitive and/or functional decline in the future, a repeat neuropsychological evaluation could be considered at that time.  If there s a desire for greater diagnostic clarity surrounding Alzheimer's disease in particular, a lumbar puncture or blood plasma testing could be considered. She should discuss these with Ms. Wertman.   Performance across neurocognitive testing  is not a strong predictor of an individual's safety operating a motor vehicle. Should her family wish to pursue a formalized driving evaluation, they could reach out to the following agencies: [employer] [facility] in [place]: [phone] [facility]: [phone] [facility]: [phone] [facility]: [phone] or [phone]  Should there be progression of current deficits over time, Ms. Bossard  is unlikely to regain any independent living skills lost. Therefore, it is recommended that she remain as involved as possible in all aspects of household chores, finances, and medication management, with supervision to ensure adequate performance. She will likely benefit from the establishment and maintenance of a routine in order to maximize her functional abilities over time.  It will be important for Ms. Seiple to have another person with her when in situations where she may need to process information, weigh the pros and cons of different options, and make decisions, in order to ensure that she fully understands and recalls all information to be considered.  If not already done, Ms. Bugay and her family may want to discuss her wishes regarding durable power of attorney and medical decision making, so that she can have input into these choices. If they require legal assistance with this, long-term care resource access, or other aspects of estate planning, they could reach out to [employer] at [phone] for a free consultation. Additionally, they may wish to discuss future plans for caretaking and seek out community options for in home/residential care should they become necessary.  Ms. Dula is encouraged to attend to lifestyle factors for brain health (e.g., regular physical exercise, good nutrition habits and consideration of the MIND-DASH diet, regular participation in cognitively-stimulating activities, and general stress management  techniques), which are likely to have benefits for both emotional adjustment and cognition. In fact, in addition to promoting good general health, regular exercise incorporating aerobic activities (e.g., brisk walking, jogging, cycling, etc.) has been demonstrated to be a very effective treatment for depression and stress, with similar efficacy rates to both antidepressant medication and psychotherapy. Optimal control of vascular risk factors (including safe cardiovascular exercise and adherence to dietary recommendations) is encouraged. Likewise, continued compliance with her CPAP machine will also be important. Continued participation in activities which provide mental stimulation and social interaction is also recommended.   Important information should be provided to Ms. Fayson in written format in all instances. This information should be placed in a highly frequented and easily visible location within her home to promote recall. External strategies such as written notes in a consistently used memory journal, visual and nonverbal auditory cues such as a calendar on the refrigerator or appointments with alarm, such as on a cell phone, can also help maximize recall.  Memory can be improved using internal strategies such as rehearsal, repetition, chunking, mnemonics, association, and imagery. External strategies such as written notes in a consistently used memory journal, visual and nonverbal auditory cues such as a calendar on the refrigerator or appointments with alarm, such as on a cell phone, can also help maximize recall.    When learning new information, she would benefit from information being broken up into small, manageable pieces. she may also find it helpful to articulate the material in her own words and in a context to promote encoding at the onset of a new task. This material may need to be repeated multiple times to promote encoding.  To address problems with processing speed, she may wish  to consider:   -Ensuring that she is alerted when essential material or instructions are being presented   -Adjusting the speed at which new information is presented   -Allowing for more time in comprehending, processing, and responding in conversation   -Repeating and paraphrasing instructions or conversations aloud  To address problems with fluctuating attention and/or executive dysfunction, she may wish to consider:   -Avoiding external distractions when  needing to concentrate   -Limiting exposure to fast paced environments with multiple sensory demands   -Writing down complicated information and using checklists   -Attempting and completing one task at a time (i.e., no multi-tasking)   -Verbalizing aloud each step of a task to maintain focus   -Taking frequent breaks during the completion of steps/tasks to avoid fatigue   -Reducing the amount of information considered at one time   -Scheduling more difficult activities for a time of day where she is usually most alert  Review of Records:   Ms. Parkison completed a comprehensive neuropsychological evaluation Gwenyth Fake, Psy.D.) on 03/05/2018 in the context of ongoing memory concerns. Results of cognitive testing were said to be abnormal, and even when interpreted in the context of low level of education, there were areas of significant impairment per Dr. Primitivo interpretation. Dr. Judeen felt that symptoms met criteria for mild dementia at that time. Despite full functional independence, Ms. Ventola had reported that she had recent had trouble remembering that she had been summoned for jury duty, which is what reportedly led to the dementia designation. Based upon severe verbal memory impairment, concerns were expressed for early Alzheimer's disease as the primary culprit. However, repeat testing was recommended.   She completed a second neuropsychological evaluation with myself on 10/19/2021. Results suggested severe impairment  surrounding all aspects of verbal memory, as well as verbal fluency (phonemic somewhat worse than semantic). Performance variability but generally below expectation performances were also exhibited across the domain of executive functioning. While Ms. Brossman was previously diagnosed with a mild dementia in 2019, it was my opinion, based upon past record review, that criteria surrounding ADL dysfunction to warrant this diagnosis was not met at that time. As such, a mild neurocognitive disorder was felt to be most appropriate. Relative to her previous evaluation in 2019, nearly all cognitive performances were stable. Subtle decline was seen across one memory task (list learning), as well as aspects of both phonemic and semantic fluency. However, outside of this, no clinically significant decline was captured between her previous evaluation and current test performances.    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 04/01/2017   Chest pain at rest 01/27/2016   Chronic diastolic heart failure 04/22/2016   Decreased estrogen level    Degeneration of lumbar intervertebral disc    Essential hypertension 01/27/2016   Exertional dyspnea 01/27/2016   GERD (gastroesophageal reflux disease)    HNP (herniated nucleus pulposus) with myelopathy, cervical 04/01/2017   Hypersomnolence 01/27/2016   Hypothyroidism 01/27/2016   Idiopathic scoliosis of lumbar spine    Impaired fasting glucose    Injury of superior glenoid labrum of shoulder joint 03/12/2018   Insomnia    Lumbar stenosis with neurogenic claudication 08/23/2021   Major depressive disorder    MCI (mild cognitive impairment) 03/05/2018   Mixed hyperlipidemia 01/27/2016   Murmur, cardiac 01/27/2016   Neuropathy    Non-toxic goiter    OSA (obstructive sleep apnea) 04/22/2016   nightly CPAP use   Osteoarthritis     Pain in joint of left shoulder 03/12/2018   Pericardial effusion 01/27/2016   PONV (postoperative nausea and vomiting)    Pre-diabetes    Restless legs    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   Shortness of breath dyspnea    relating to asthma  Stage 3 chronic kidney disease    Syncope 01/27/2016    Past Surgical History:  Procedure Laterality Date   ABDOMINAL HYSTERECTOMY     BACK SURGERY  08/23/2021   BREAST REDUCTION SURGERY  2011   CERVICAL DISC ARTHROPLASTY N/A 04/01/2017   Procedure: CERVICAL ANTERIOR DISC ARTHROPLASTY C5-C6;  Surgeon: Colon Shove, MD;  Location: [facility] OR;  Service: Neurosurgery;  Laterality: N/A;   COLONOSCOPY     ESOPHAGOGASTRODUODENOSCOPY     EYE SURGERY     bilateral cataracts   JOINT REPLACEMENT     right shoulder replacement   REDUCTION MAMMAPLASTY     REVERSE SHOULDER ARTHROPLASTY Right 08/02/2016   Procedure: REVERSE SHOULDER ARTHROPLASTY;  Surgeon: Marcey Her, MD;  Location: [facility] OR;  Service: Orthopedics;  Laterality: Right;    Current Outpatient Medications:    ACCU-CHEK GUIDE test strip, 1 each 2 (two) times a week., Disp: , Rfl:    Accu-Chek Softclix Lancets lancets, SMARTSIG:Topical Daily on Weekdays, Disp: , Rfl:    albuterol  (PROVENTIL  HFA;VENTOLIN  HFA) 108 (90 Base) MCG/ACT inhaler, Inhale 2 puffs into the lungs every 6 (six) hours as needed for wheezing or shortness of breath., Disp: , Rfl:    amLODipine  (NORVASC ) 5 MG tablet, TAKE 1 TABLET BY MOUTH DAILY, Disp: 90 tablet, Rfl: 0   aspirin  81 MG tablet, Take 81 mg by mouth daily., Disp: , Rfl:    azithromycin  (ZITHROMAX ) 250 MG tablet, Take 1 tablet (250 mg total) by mouth daily. Take first 2 tablets together, then 1 every day until finished., Disp: 6 tablet, Rfl: 0   Blood Glucose Monitoring Suppl (ACCU-CHEK GUIDE) w/Device KIT, AS DIRECTED TO CHECK BLOOD SUGARS FINGER STICKS ONCE A DAY DX E11.22, Disp: , Rfl:    BREZTRI AEROSPHERE 160-9-4.8 MCG/ACT AERO, Inhale 2 puffs into the lungs 2  (two) times daily., Disp: , Rfl:    donepezil  (ARICEPT ) 10 MG tablet, Take 10 mg by mouth at bedtime., Disp: , Rfl:    furosemide  (LASIX ) 20 MG tablet, Take 1 tablet (20 mg total) by mouth daily. NEED OV., Disp: 30 tablet, Rfl: 0   levothyroxine  (SYNTHROID , LEVOTHROID) 50 MCG tablet, Take 50 mcg by mouth daily. , Disp: , Rfl:    lisinopril -hydrochlorothiazide  (ZESTORETIC ) 20-25 MG tablet, Take 1 tablet by mouth daily., Disp: , Rfl:    methocarbamol  (ROBAXIN ) 500 MG tablet, Take 1 tablet (500 mg total) by mouth every 6 (six) hours as needed for muscle spasms., Disp: 40 tablet, Rfl: 3   rOPINIRole  (REQUIP ) 1 MG tablet, Take 1 mg by mouth 2 (two) times daily., Disp: , Rfl:    rosuvastatin  (CRESTOR ) 20 MG tablet, TAKE 1 TABLET BY MOUTH DAILY, Disp: 100 tablet, Rfl: 2   sertraline  (ZOLOFT ) 100 MG tablet, Take 100 mg by mouth daily., Disp: , Rfl:    TRELEGY ELLIPTA 100-62.5-25 MCG/INH AEPB, Inhale 1 puff into the lungs every other day. As needed, Disp: , Rfl:    valACYclovir (VALTREX) 1000 MG tablet, Take 2,000 mg by mouth 2 (two) times daily as needed., Disp: , Rfl:    vitamin B-12 (CYANOCOBALAMIN) 1000 MCG tablet, Take 1,000 mcg by mouth daily., Disp: , Rfl:      11/21/2023   11:00 AM 05/21/2023    5:00 PM 11/20/2022   10:00 AM  MMSE - Mini Mental State Exam  Orientation to time 5 5 5   Orientation to Place 5 5 5   Registration 3 3 3   Attention/ Calculation 5 5 5   Recall 3 3 3  Language- name 2 objects 2 2 2   Language- repeat 1 1 1   Language- follow 3 step command 3 3 3   Language- read & follow direction 1 1 1   Write a sentence 1 1 1   Copy design 1 0 1  Total score 30 29 30       06/13/2021    8:00 AM  Montreal Cognitive Assessment   Visuospatial/ Executive (0/5) 3  Naming (0/3) 3  Attention: Read list of digits (0/2) 2  Attention: Read list of letters (0/1) 1  Attention: Serial 7 subtraction starting at 100 (0/3) 0  Language: Repeat phrase (0/2) 1  Language : Fluency (0/1) 0   Abstraction (0/2) 1  Delayed Recall (0/5) 0  Orientation (0/6) 6  Total 17  Adjusted Score (based on education) 17   Neuroimaging: Brain MRI on 03/22/2018 revealed mild small vessel ischemia but was otherwise unremarkable. Brain MRI on 06/28/2021 revealed mild generalized cerebral atrophy and minimal small vessel ischemic changes.   Clinical Interview:   The following information was obtained during a clinical interview with Ms. Gains prior to cognitive testing.  Cognitive Symptoms: Decreased short-term memory: Endorsed. Back in 2019, Ms. Benjamin noted that memory dysfunction had been present for the past 1-1.5 years. Examples included forgetting the details of past conversations, forgetting recent events, repeating questions and statements, and misplacing things around her residence. When seen in 2022, she reported persisting difficulties, adding that she may also enter a room and forget her original intention. Currently, she minimized memory concerns, reporting her perception of stability since her 2022 evaluation. Decreased long-term memory: Denied. Decreased attention/concentration: Denied. Reduced processing speed: Denied. Difficulties with executive functions: Denied. She also denied trouble with impulsivity or any significant personality changes.  Difficulties with emotion regulation: Denied. Difficulties with receptive language: Denied. Difficulties with word finding: Denied. Decreased visuoperceptual ability: Denied.   Difficulties completing ADLs: Denied. In 2019, Ms. Lover lived alone and was fully functional outside of an instance where she forget about jury duty despite being reminded that same day. Currently, Ms. Carreno reported continued independence with medication management, financial management, and bill paying. She also continues to drive without reported difficulty.  Additional Medical History: History of traumatic brain injury/concussion: Denied. History of  stroke: Denied. History of seizure activity: Denied. History of known exposure to toxins: Denied. Symptoms of chronic pain: Denied. Experience of frequent headaches/migraines: Denied. Frequent instances of dizziness/vertigo: Denied.   Sensory changes: Denied.  Balance/coordination difficulties: Denied. She also denied any recent falls.  Other motor difficulties: Endorsed. Previously, she reported mild, sporadic tremors which are not present all the time. She was unable to identify any triggers or known symptoms or situations which bring about tremors. Per notes from her PCP, mild tremors have been present for many years and do not appear to have progressively worsened. Currently, she described sporadic experiences, largely whenever she has been holding an object in her hand for an extended period of time.   Sleep History: Estimated hours obtained each night: Unclear. Ms. Golonka previously described her sleep as sporadic. She noted that some nights she will be up all night, I don't sleep a bit. These nights are generally followed by decent sleep due to fatigue levels. This pattern was said to be ongoing and unchanged for quite some time.  Difficulties falling asleep: Endorsed. Difficulties staying asleep: Endorsed. Feels rested and refreshed upon awakening: Variably so depending on the quantity and quality of sleep she is able to obtain the night before.  History of snoring: Endorsed. History of waking up gasping for air: Endorsed. Witnessed breath cessation while asleep: Endorsed. She acknowledged a history of obstructive sleep apnea and reported using her CPAP machine nightly.    History of vivid dreaming: Denied. Notes from her PCP on 06/04/2021 suggested expressed concerns surrounding her experiencing vivid crazy dreams. Excessive movement while asleep: Endorsed. She previously reported mild jerking behaviors, stating that during her prior sleep study which revealed obstructive sleep  apnea, there was also mention of jerking behaviors and some tossing and turning behaviors. Medical records do suggest a history of restless leg symptoms.  Instances of acting out her dreams: Denied. However, per notes from her PCP on 06/04/2021, Dr. Loreli noted that she will occasionally act out vivid dreams described above. Dr. Loreli further noted Ms. Arakelian stating that her now deceased husband used to tell her that she would occasionally sleepwalk.   Psychiatric/Behavioral Health History: Depression: She previously reported a longstanding history of generally mild depressive symptoms. She has taken sertraline  for several years at this point and reported continued effectiveness at managing symptoms. She denied previous involvement with a psychotherapist. Currently, she described her mood as some days I'm good, some days I'm not. Current or remote suicidal ideation, intent, or plan was denied.  Anxiety: Symptoms were said to occur occasionally and she denied, to her knowledge, any prior official anxiety-based mental health diagnosis.  Mania: Denied. Trauma History: Denied. Visual/auditory hallucinations: Denied. Delusional thoughts: Denied.   Tobacco: Denied. Alcohol: She denied current alcohol consumption as well as a history of problematic alcohol abuse or dependence.  Recreational drugs: Denied.  Family History: Problem Relation Age of Onset   Heart failure Mother    Diabetes Mother    Hypertension Mother    Hyperlipidemia Mother    Lung cancer Father    Diabetes Father    Hypertension Father    Hypertension Sister    Hyperlipidemia Sister    Hyperthyroidism Sister    Lung cancer Brother    Rheum arthritis Child    Lung cancer Child    Dementia Maternal Grandmother    Heart disease Maternal Grandfather    Lung cancer Paternal Grandfather    Diabetes Brother    Hyperlipidemia Brother    Hyperlipidemia Brother    Dementia Maternal Aunt    This information was confirmed by Ms.  Rodgers.  Academic/Vocational History: Highest level of educational attainment: 9 years. Ms. Roberge reported completing the 9th grade and then pursuing no further academic achievement. When asked why she left school settings, she did not provide any specific rationale. She noted that she always had longstanding difficulties with comprehension and was enrolled in special education courses throughout academic settings. She was likely a poor student due to academic difficulties and may have an undiagnosed, unspecified learning disability.  History of developmental delay: Denied. History of grade repetition: Denied. Enrollment in special education courses: Endorsed. History of ADHD: Denied.   Employment: Retired. She previously worked as an Oceanographer.   Evaluation Results:   Behavioral Observations: Ms. Mongeau was unaccompanied, arrived to her appointment on time, and was appropriately dressed and groomed. She appeared alert. Observed gait and station were within normal limits. Gross motor functioning appeared intact upon informal observation and no abnormal movements (e.g., tremors) were noted. Her affect was generally relaxed and positive, but did range appropriately given the subject being discussed during interview. Spontaneous speech was fluent and word finding difficulties were not observed during the clinical interview. Thought processes were  coherent, organized, and normal in content. Insight into her cognitive difficulties appeared somewhat limited and I do have concern that she does not fully appreciate the extent of isolated weaknesses.   During testing, sustained attention was appropriate. Task engagement was adequate and she persisted when challenged. Overall, Ms. Spraker was cooperative with the clinical interview and subsequent testing procedures.   Adequacy of Effort: The validity of neuropsychological testing is limited by the extent to which the individual being tested may be  assumed to have exerted adequate effort during testing. Ms. Rios expressed her intention to perform to the best of her abilities and exhibited adequate task engagement and persistence. Scores across stand-alone and embedded performance validity measures were within expectation. As such, the results of the current evaluation are believed to be a valid representation of Ms. Pinkhasov current cognitive functioning.  Test Results: Ms. Dutkiewicz was mildly disoriented at the time of the current evaluation. She was unable to provide her phone number or name of the current clinic.  Intellectual abilities based upon educational and vocational attainment were estimated to be in the below average range. Premorbid abilities were estimated to be within the well below average range based upon a single-word reading test.   Processing speed was variable, ranging from the well below average to average normative ranges. Basic attention was below average. More complex attention (e.g., working memory) was also below average. Executive functioning was variable, ranging from the exceptionally low to average normative ranges.  Assessed receptive language abilities were above average. Likewise, Ms. Durante did not exhibit any difficulties comprehending task instructions and answered all questions asked of her appropriately. Assessed expressive language (e.g., verbal fluency and confrontation naming) was well below average to below average.     Assessed visuospatial/visuoconstructional abilities were below average to average.    Learning (i.e., encoding) of novel verbal information was exceptionally low to well below average. Spontaneous delayed recall (i.e., retrieval) of previously learned information was variable, ranging from the exceptionally low to average normative ranges. Retention rates were 100% (cued) to 133% (spontaneous) across a list learning task, 0% across a story learning task, and 63% across a figure drawing  task. Performance across recognition tasks was well below average to below average, suggesting limited evidence for information consolidation.   Results of emotional screening instruments suggested that recent symptoms of generalized anxiety were in the minimal range, while symptoms of depression were within the mild range. A screening instrument assessing recent sleep quality suggested the presence of minimal sleep dysfunction.  Table of Scores:   Note: This summary of test scores accompanies the interpretive report and should not be considered in isolation without reference to the appropriate sections in the text. Descriptors are based on appropriate normative data and may be adjusted based on clinical judgment. Terms such as Within Normal Limits and Outside Normal Limits are used when a more specific description of the test score cannot be determined. Descriptors refer to the current evaluation only.         Percentile - Normative Descriptor > 98 - Exceptionally High 91-97 - Well Above Average 75-90 - Above Average 25-74 - Average 9-24 - Below Average 2-8 - Well Below Average < 2 - Exceptionally Low         Validity: April 2019 December 2022 Current  DESCRIPTOR         DCT: --- --- --- --- Within Normal Limits  WAIS-IV RDS: --- --- --- --- Outside Normal Limits  CVLT-III FC: --- --- --- ---  Within Normal Limits         Orientation:        Raw Score Raw Score Raw Score Percentile   NAB Orientation, Form 1 --- 28/29 25/29 --- ---         Cognitive Screening:        Raw Score Raw Score Raw Score Percentile   SLUMS: --- 17/30 15/30 --- ---         Intellectual Functioning:        Standard Score Standard Score Standard Score Percentile   Test of Premorbid Functioning: 74 74 73 4 Well Below Average         Wechsler Adult Intelligence Scale (WAIS-IV) Short Form*: Standard Score/ Scaled Score Standard Score/ Scaled Score Standard Score/ Scaled Score Percentile   Full Scale IQ   --- 75 --- --- ---    Information  --- 4 --- --- ---    Visual Puzzles --- 7 --- --- ---    Digit Span --- 6 --- --- ---    Coding --- 8 --- --- ---  *From Earnesteen & Ryan (2009)              Memory:       Wechsler Memory Scale (WMS-IV):                       Raw Score (Scaled Score) Raw Score (Scaled Score) Raw Score (Scaled Score) Percentile     Logical Memory I 11/53 (3) 15/53 (4) 18/53 (5) 5 Well Below Average    Logical Memory II 0/39 (1) 3/39 (3) 0/39 (1) <1 Exceptionally Low    Logical Memory Recognition 8/23 13/23 15/23 10-16 Below Average         [place]  Verbal Learning Test (CVLT-III) Brief Form: Raw Score (Z-Score) Raw Score (Scaled/Standard Score) Raw Score (Scaled/Standard Score) Percentile     Total Trials 1-4 19/36 (32 T) 16/36 (63) 10/36 (49) <1 Exceptionally Low    Short-Delay Free Recall 5/9 (-1.5) 2/9 (1) 3/9 (2) <1 Exceptionally Low    Long-Delay Free Recall 4/9 (-1.5) 2/9 (3) 4/9 (6) 9 Below Average    Long-Delay Cued Recall 5/9 (-1.5) 3/9 (3) 3/9 (3) 1 Exceptionally Low    Recognition Hits 8/9 8/9 (10) 6/9 (6) 9 Below Average    False Positive Errors 3 2 (7) 3 (5) 5 Well Below Average                 Raw Score Raw Score (Scaled Score) Raw Score (Scaled Score) Percentile   RBANS Figure Copy: 19/20 19/20 (12) 16/20 (7) 16 Below Average  RBANS Figure Recall: 11/20 15/20 (12) 10/20 (8) 25 Average    RBANS Figure Recognition: --- 5/8 4/8 9-20 Below Average         Attention/Executive Function:       Trail Making Test (TMT): Raw Score Raw Score (T Score) Raw Score (T Score) Percentile     Part A 54 secs.,  0 errors 49 secs.,  0 errors (44) 53 secs.,  1 error (40) 16 Below Average    Part B 95 secs.,  0 errors 98 secs.,  0 errors (53) Discontinued --- Impaired           Scaled Score Scaled Score Scaled Score Percentile   WAIS-IV Coding: --- 7 5 5  Well Below Average          Scaled Score Scaled Score Scaled Score Percentile   WAIS-IV Digit Span:  ---  6 5 5  Well Below Average    Forward 5 7 7 16  Below Average    Backward 6 8 6 9  Below Average    Sequencing --- 6 6 9  Below Average          Scaled Score Scaled Score Scaled Score Percentile   WAIS-IV Similarities: --- --- 9 37 Average         D-KEFS Color-Word Interference Test: Raw Score (Scaled Score) Raw Score (Scaled Score) Raw Score (Scaled Score) Percentile     Color Naming --- 34 secs. (10) 40 secs. (7) 16 Below Average    Word Reading --- 28 secs. (9) 30 secs. (8) 25 Average    Inhibition --- 79 secs. (9) 81 secs. (9) 37 Average      Total Errors --- 14 errors (1) 8 errors (5) 5 Well Below Average    Inhibition/Switching --- 86 secs. (9) 66 secs. (12) 75 Above Average      Total Errors --- 10 errors (3) 14 errors (1) <1 Exceptionally Low         D-KEFS Verbal Fluency Test: Raw Score (Scaled Score) Raw Score (Scaled Score) Raw Score (Scaled Score) Percentile     Letter Total Correct --- 9 (2) 17 (5) 5 Well Below Average    Category Total Correct --- 19 (4) 21 (5) 5 Well Below Average    Category Switching Total Correct --- 8 (5) 8 (5) 5 Well Below Average    Category Switching Accuracy --- 6 (5) 5 (4) 2 Well Below Average      Total Set Loss Errors --- 3 (9) 2 (10) 50 Average      Total Repetition Errors --- 1 (12) 3 (10) 50 Average         Language:       Verbal Fluency Test: Raw Score Raw Score (T Score) Raw Score (T Score) Percentile     Phonemic Fluency (FAS) 14 9 (20) 17 (30) 2 Well Below Average    Animal Fluency 13 8 (26) 12 (38) 12 Below Average          NAB Language Module, Form 1: T Score T Score T Score Percentile     Auditory Comprehension --- 49 57 75 Above Average    Naming --- 27/31 (40) 25/31 (35) 7 Well Below Average         Visuospatial/Visuoconstruction:        Raw Score Raw Score Raw Score Percentile   Clock Drawing: WNL 10/10 7/10 --- Within Normal Limits         NAB Spatial Module, Form 1: T Score T Score T Score Percentile     Visual  Discrimination --- 48 52 58 Average          Scaled Score Scaled Score Scaled Score Percentile   WAIS-IV Block Design: 8 9 11  63 Average         Mood and Personality:        Raw Score Raw Score Raw Score Percentile   Geriatric Depression Scale: --- 6 10 --- Mild  Geriatric Anxiety Scale: --- 3 7 --- Minimal    Somatic --- 2 5 --- Minimal    Cognitive --- 0 0 --- Minimal    Affective --- 1 2 --- Minimal         Additional Questionnaires:        Raw Score Raw Score Raw Score Percentile   PROMIS Sleep Disturbance Questionnaire: --- 25 24 --- None to Slight  Informed Consent and Coding/Compliance:   The current evaluation represents a clinical evaluation for the purposes previously outlined by the referral source and is in no way reflective of a forensic evaluation.   Ms. Pluta was provided with a verbal description of the nature and purpose of the present neuropsychological evaluation. Also reviewed were the foreseeable risks and/or discomforts and benefits of the procedure, limits of confidentiality, and mandatory reporting requirements of this provider. The patient was given the opportunity to ask questions and receive answers about the evaluation. Oral consent to participate was provided by the patient.   This evaluation was conducted by Arthea KYM Maryland, Ph.D., ABPP-CN, board certified clinical neuropsychologist. Ms. Waner completed a clinical interview with Dr. Maryland, billed as one unit [phone], and 145 minutes of cognitive testing and scoring, billed as one unit [phone] and four additional units 96139. Psychometrist Lonell Jude, B.S. assisted Dr. Maryland with test administration and scoring procedures. As a separate and discrete service, one unit [phone] and two units 96133 (160 minutes) were billed for Dr. Loralee time spent in interpretation and report writing.

## 2024-07-02 NOTE — Progress Notes (Signed)
   Psychometrician Note   Cognitive testing was administered to Charlotte [name] by Lonell Jude, B.S. (psychometrist) under the supervision of Dr. Arthea KYM Maryland, Ph.D., ABPP, licensed psychologist on 07/02/2024. [name] did not appear overtly distressed by the testing session per behavioral observation or responses across self-report questionnaires. Rest breaks were offered.    The battery of tests administered was selected by Dr. Zachary C. Merz, Ph.D., ABPP with consideration to [name]'s current level of functioning, the nature of her symptoms, emotional and behavioral responses during interview, level of literacy, observed level of motivation/effort, and the nature of the referral question. This battery was communicated to the psychometrist. Communication between Dr. Arthea KYM Maryland, Ph.D., ABPP and the psychometrist was ongoing throughout the evaluation and Dr. Arthea KYM Maryland, Ph.D., ABPP was immediately accessible at all times. Dr. Zachary C. Merz, Ph.D., ABPP provided supervision to the psychometrist on the date of this service to the extent necessary to assure the quality of all services provided.    Charlotte [name] will return within approximately 1-2 weeks for an interactive feedback session with Dr. Maryland at which time her test performances, clinical impressions, and treatment recommendations will be reviewed in detail. [name] understands she can contact our office should she require our assistance before this time.  A total of 145 minutes of billable time were spent face-to-face with [name] by the psychometrist. This includes both test administration and scoring time. Billing for these services is reflected in the clinical report generated by Dr. Arthea KYM Maryland, Ph.D., ABPP  This note reflects time spent with the psychometrician and does not include test scores or any clinical interpretations made by Dr. Maryland. The full report will follow in a separate  note.

## 2024-07-06 DIAGNOSIS — J3089 Other allergic rhinitis: Secondary | ICD-10-CM | POA: Diagnosis not present

## 2024-07-06 DIAGNOSIS — J301 Allergic rhinitis due to pollen: Secondary | ICD-10-CM | POA: Diagnosis not present

## 2024-07-06 DIAGNOSIS — J3081 Allergic rhinitis due to animal (cat) (dog) hair and dander: Secondary | ICD-10-CM | POA: Diagnosis not present

## 2024-07-09 ENCOUNTER — Ambulatory Visit (INDEPENDENT_AMBULATORY_CARE_PROVIDER_SITE_OTHER): Payer: Medicare Other | Admitting: Psychology

## 2024-07-09 DIAGNOSIS — G3184 Mild cognitive impairment, so stated: Secondary | ICD-10-CM | POA: Diagnosis not present

## 2024-07-09 NOTE — Progress Notes (Signed)
   Neuropsychology Feedback Session Jolynn DEL. [facility] [place] Department of Neurology  Reason for Referral:   Charlotte [name] is a 73 y.o. right-handed Caucasian female referred by Camie Sevin, PA-C, to characterize her current cognitive functioning and assist with diagnostic clarity and treatment planning in the context of a previously diagnosed mild neurocognitive disorder and concern for progressive cognitive decline.   Feedback:   [name] completed a comprehensive neuropsychological evaluation on 07/02/2024. Please refer to that encounter for the full report and recommendations. Briefly, results suggested primary deficits surrounding expressive language and encoding (i.e., learning) aspects of memory. Further weakness/variability was exhibited across processing speed, executive functioning, and both delayed retrieval and recognition aspects of memory. Relative to her previous evaluation in December 2022, isolated decline was exhibited across a visuomotor sequencing task (TMT B) assessing cognitive flexibility, clock drawing, and confrontation naming. However, other tasks assessing executive functioning, visuospatial abilities, and expressive language were relatively stable. Despite still being impaired overall, verbal fluency did see some mild improvements. Mild decline could be argued across delayed retrieval aspects of a figure drawing task. However, memory as a whole, largely exhibited stability. The cause for ongoing cognitive dysfunction remains uncertain. Previous evaluations have suggested the plausibility of an underlying neurodegenerative illness such as Alzheimer's disease.  While this illness cannot be ruled out and simply progressing in a very slow manner, it cannot be ruled in with confidence based solely on testing performances. There remains the possibility that current testing patterns are related to longstanding baseline intellectual abilities and the  normal aging process, exacerbated by various chronic medical ailments, sleep disturbances, and essential tremor concerns. Continued medical monitoring will be important moving forward.   [name] was unaccompanied during the current feedback session. Content of the current session focused on the results of her neuropsychological evaluation. [name] was given the opportunity to ask questions and her questions were answered. She was encouraged to reach out should additional questions arise. A copy of her report was provided at the conclusion of the visit.      One unit 96132 (31 minutes) was billed for Dr. Loralee time spent preparing for, conducting, and documenting the current feedback session with [name].

## 2024-07-13 DIAGNOSIS — J3081 Allergic rhinitis due to animal (cat) (dog) hair and dander: Secondary | ICD-10-CM | POA: Diagnosis not present

## 2024-07-13 DIAGNOSIS — J301 Allergic rhinitis due to pollen: Secondary | ICD-10-CM | POA: Diagnosis not present

## 2024-07-13 DIAGNOSIS — J3089 Other allergic rhinitis: Secondary | ICD-10-CM | POA: Diagnosis not present

## 2024-07-20 ENCOUNTER — Ambulatory Visit: Payer: Medicare Other | Admitting: Physician Assistant

## 2024-07-22 DIAGNOSIS — E1122 Type 2 diabetes mellitus with diabetic chronic kidney disease: Secondary | ICD-10-CM | POA: Diagnosis not present

## 2024-07-27 DIAGNOSIS — J3089 Other allergic rhinitis: Secondary | ICD-10-CM | POA: Diagnosis not present

## 2024-07-27 DIAGNOSIS — J3081 Allergic rhinitis due to animal (cat) (dog) hair and dander: Secondary | ICD-10-CM | POA: Diagnosis not present

## 2024-07-27 DIAGNOSIS — J301 Allergic rhinitis due to pollen: Secondary | ICD-10-CM | POA: Diagnosis not present

## 2024-08-10 DIAGNOSIS — J3081 Allergic rhinitis due to animal (cat) (dog) hair and dander: Secondary | ICD-10-CM | POA: Diagnosis not present

## 2024-08-10 DIAGNOSIS — J301 Allergic rhinitis due to pollen: Secondary | ICD-10-CM | POA: Diagnosis not present

## 2024-08-10 DIAGNOSIS — J3089 Other allergic rhinitis: Secondary | ICD-10-CM | POA: Diagnosis not present

## 2024-08-24 DIAGNOSIS — J3081 Allergic rhinitis due to animal (cat) (dog) hair and dander: Secondary | ICD-10-CM | POA: Diagnosis not present

## 2024-08-24 DIAGNOSIS — J301 Allergic rhinitis due to pollen: Secondary | ICD-10-CM | POA: Diagnosis not present

## 2024-08-24 DIAGNOSIS — J3089 Other allergic rhinitis: Secondary | ICD-10-CM | POA: Diagnosis not present

## 2024-08-25 DIAGNOSIS — J3089 Other allergic rhinitis: Secondary | ICD-10-CM | POA: Diagnosis not present

## 2024-08-25 DIAGNOSIS — J301 Allergic rhinitis due to pollen: Secondary | ICD-10-CM | POA: Diagnosis not present

## 2024-08-25 DIAGNOSIS — J3081 Allergic rhinitis due to animal (cat) (dog) hair and dander: Secondary | ICD-10-CM | POA: Diagnosis not present

## 2024-09-07 DIAGNOSIS — J3089 Other allergic rhinitis: Secondary | ICD-10-CM | POA: Diagnosis not present

## 2024-09-07 DIAGNOSIS — J301 Allergic rhinitis due to pollen: Secondary | ICD-10-CM | POA: Diagnosis not present

## 2024-09-07 DIAGNOSIS — J3081 Allergic rhinitis due to animal (cat) (dog) hair and dander: Secondary | ICD-10-CM | POA: Diagnosis not present

## 2024-09-17 ENCOUNTER — Ambulatory Visit: Admitting: Physician Assistant

## 2024-09-22 NOTE — Progress Notes (Addendum)
 Charlotte [name]                                          MRN: [id number]   10/26/2024   The [facility] Quality Team Specialist reviewed this patient medical record for the purposes of chart review for care gap closure. The following were reviewed: chart review for care gap closure-kidney health evaluation for diabetes:uACR.    [facility] Quality Team

## 2024-10-21 ENCOUNTER — Other Ambulatory Visit: Payer: Self-pay | Admitting: Neurological Surgery

## 2024-10-21 DIAGNOSIS — M5416 Radiculopathy, lumbar region: Secondary | ICD-10-CM

## 2024-10-25 ENCOUNTER — Other Ambulatory Visit

## 2024-10-25 DIAGNOSIS — M5416 Radiculopathy, lumbar region: Secondary | ICD-10-CM

## 2024-10-25 MED ORDER — GADOBUTROL 1 MMOL/ML IV SOLN
9.0000 mL | Freq: Once | INTRAVENOUS | Status: AC | PRN
  Administered 2024-10-25: 9 mL via INTRAVENOUS

## 2024-10-27 LAB — LAB REPORT - SCANNED
A1c: 5.8
Albumin, Urine POC: <0.7
Creatinine, POC: 153 mg/dL
EGFR: 54
Microalb Creat Ratio: <4.6
TSH: 1.65

## 2024-12-06 ENCOUNTER — Other Ambulatory Visit: Payer: Self-pay | Admitting: Cardiovascular Disease

## 2024-12-23 ENCOUNTER — Other Ambulatory Visit: Payer: Self-pay | Admitting: Neurological Surgery

## 2024-12-24 ENCOUNTER — Ambulatory Visit: Admitting: Physician Assistant

## 2025-01-10 ENCOUNTER — Ambulatory Visit (HOSPITAL_COMMUNITY): Admit: 2025-01-10 | Admitting: Neurological Surgery

## 2025-01-14 ENCOUNTER — Ambulatory Visit: Admitting: Cardiology

## 2025-01-24 ENCOUNTER — Ambulatory Visit: Admitting: Physician Assistant
